# Patient Record
Sex: Female | Born: 1990 | Race: White | Hispanic: No | Marital: Married | Smoking: Never smoker
Health system: Southern US, Community
[De-identification: ages and names within clinical notes are randomized; demographics above are authoritative.]

## PROBLEM LIST (undated history)

## (undated) DIAGNOSIS — I10 Essential (primary) hypertension: Secondary | ICD-10-CM

## (undated) DIAGNOSIS — U071 COVID-19: Secondary | ICD-10-CM

## (undated) DIAGNOSIS — F32A Depression, unspecified: Secondary | ICD-10-CM

## (undated) HISTORY — PX: WISDOM TOOTH EXTRACTION: SHX21

---
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
## 2017-03-17 ENCOUNTER — Encounter: Payer: Self-pay | Admitting: Physician Assistant

## 2017-03-17 ENCOUNTER — Ambulatory Visit: Payer: BC Managed Care – PPO | Admitting: Physician Assistant

## 2017-03-17 VITALS — BP 138/86 | HR 80 | Temp 98.6°F | Resp 16 | Ht 66.0 in | Wt 185.0 lb

## 2017-03-17 DIAGNOSIS — Z130 Encounter for screening for diseases of the blood and blood-forming organs and certain disorders involving the immune mechanism: Secondary | ICD-10-CM | POA: Diagnosis not present

## 2017-03-17 DIAGNOSIS — Z114 Encounter for screening for human immunodeficiency virus [HIV]: Secondary | ICD-10-CM | POA: Diagnosis not present

## 2017-03-17 DIAGNOSIS — Z13220 Encounter for screening for lipoid disorders: Secondary | ICD-10-CM | POA: Diagnosis not present

## 2017-03-17 DIAGNOSIS — Z1329 Encounter for screening for other suspected endocrine disorder: Secondary | ICD-10-CM | POA: Diagnosis not present

## 2017-03-17 DIAGNOSIS — F419 Anxiety disorder, unspecified: Secondary | ICD-10-CM | POA: Diagnosis not present

## 2017-03-17 DIAGNOSIS — Z131 Encounter for screening for diabetes mellitus: Secondary | ICD-10-CM

## 2017-03-17 DIAGNOSIS — Z8639 Personal history of other endocrine, nutritional and metabolic disease: Secondary | ICD-10-CM

## 2017-03-17 MED ORDER — ESCITALOPRAM OXALATE 10 MG PO TABS: 10.0000 mg | ORAL_TABLET | Freq: Every day | ORAL | 2 refills | Status: DC

## 2017-03-17 NOTE — Patient Instructions (Signed)

## 2017-03-17 NOTE — Progress Notes (Signed)
Patient: Alexandra Fleming Female    DOB: 08/19/1990   27 y.o.   MRN: [id number] Visit Date: 03/17/2017  Today's Provider: Trey SailorsAdriana M Pollak, PA-C   Chief Complaint  Patient presents with  . Establish Care  . Anxiety   Subjective:    Alexandra Oppenheimabitha Stinson Baade is a 27 y/o woman presenting today to establish care. She was a patient at this clinic remotely but hasn't been seen here in six years. She [place] living in [place] with her husband of 6 years. She works as an Tourist information centre managerelementary school teacher. Pets: two dogs, two cats.   She is sexually active with her husband. Uses condoms, does not desire birth control.  She had a PAP 6 years ago.  Flu shot: declined Tetanus shot: unsure of last date, will see if we can get records  She has a history of anxiety remotely. She used to be on lexparo, which worked well for her. She cites she has recently had excessive and unreasonable worry. She would like to go back on lexapro. Denies feeling down or depressed. Denies SI/HI.   Anxiety  Presents for initial visit. Onset was 1 to 6 months ago. The problem has been gradually worsening. Symptoms include excessive worry, nervous/anxious behavior and panic. Patient reports no chest pain, compulsions, confusion, decreased concentration, depressed mood, dizziness, dry mouth, feeling of choking, hyperventilation, impotence, insomnia, irritability, malaise, muscle tension, nausea, obsessions, palpitations, restlessness, shortness of breath or suicidal ideas. The symptoms are aggravated by family issues and work stress. The quality of sleep is fair.   Past treatments include SSRIs (Lexapro).       Allergies  Allergen Reactions  . Amoxicillin   . Penicillins     No current outpatient medications on file.  Review of Systems  Constitutional: Positive for appetite change. Negative for activity change, chills, diaphoresis, fatigue, fever, irritability and unexpected weight change.  Respiratory:  Negative for shortness of breath.   Cardiovascular: Negative for chest pain and palpitations.  Gastrointestinal: Negative for nausea.  Genitourinary: Negative for impotence.  Neurological: Negative for dizziness.  Psychiatric/Behavioral: Negative for confusion, decreased concentration, dysphoric mood, hallucinations, self-injury, sleep disturbance and suicidal ideas. The patient is nervous/anxious. The patient does not have insomnia and is not hyperactive.     Social History   Tobacco Use  . Smoking status: Never Smoker  . Smokeless tobacco: Never Used  Substance Use Topics  . Alcohol use: Yes    Alcohol/week: 3.6 oz    Types: 2 Glasses of wine, 4 Cans of beer per week   Objective:   BP 138/86 (BP Location: Right Arm, Patient Position: Sitting, Cuff Size: Large)   Pulse 80   Temp 98.6 F (37 C) (Oral)   Resp 16   Ht 5\' 6"  (1.676 m)   Wt 185 lb (83.9 kg)   LMP 03/10/2017   BMI 29.86 kg/m  Vitals:   03/17/17 1420  BP: 138/86  Pulse: 80  Resp: 16  Temp: 98.6 F (37 C)  TempSrc: Oral  Weight: 185 lb (83.9 kg)  Height: 5\' 6"  (1.676 m)     Physical Exam  Constitutional: She is oriented to person, place, and time. She appears well-developed and well-nourished.  Cardiovascular: Normal rate and regular rhythm.  Pulmonary/Chest: Effort normal and breath sounds normal.  Abdominal: Soft. Bowel sounds are normal.  Neurological: She is alert and oriented to person, place, and time.  Skin: Skin is warm and dry.  Psychiatric: She has a normal  mood and affect. Her behavior is normal.        Assessment & Plan:     1. Anxiety  Will start Lexapro today and see her back at physical to assess how she's doing. Plan to to PAP and breast exam and physical.  - escitalopram (LEXAPRO) 10 MG tablet; Take 1 tablet (10 mg total) by mouth daily.  Dispense: 30 tablet; Refill: 2  2. History of vitamin D deficiency  - Vitamin D (25 hydroxy)  3. Encounter for screening for HIV  - HIV  antibody (with reflex)  4. Screening for thyroid disorder  - TSH  5. Screening, lipid  - Lipid Profile  6. Diabetes mellitus screening  - Comprehensive Metabolic Panel (CMET)  7. Screening for deficiency anemia  - CBC with Differential  Return in about 1 month (around 04/17/2017) for anxiety.  The entirety of the information documented in the History of Present Illness, Review of Systems and Physical Exam were personally obtained by me. Portions of this information were initially documented by Kavin Leech, CMA and reviewed by me for thoroughness and accuracy.        Trey Sailors, PA-C  [facility]

## 2017-03-18 DIAGNOSIS — F419 Anxiety disorder, unspecified: Secondary | ICD-10-CM | POA: Insufficient documentation

## 2017-03-19 LAB — CBC WITH DIFFERENTIAL/PLATELET
Basophils Absolute: 0 10*3/uL (ref 0.0–0.2)
Basos: 0 %
EOS (ABSOLUTE): 0 10*3/uL (ref 0.0–0.4)
Eos: 1 %
Hematocrit: 37.9 % (ref 34.0–46.6)
Hemoglobin: 12.9 g/dL (ref 11.1–15.9)
Immature Grans (Abs): 0 10*3/uL (ref 0.0–0.1)
Immature Granulocytes: 0 %
Lymphocytes Absolute: 1.9 10*3/uL (ref 0.7–3.1)
Lymphs: 26 %
MCH: 29.7 pg (ref 26.6–33.0)
MCHC: 34 g/dL (ref 31.5–35.7)
MCV: 87 fL (ref 79–97)
Monocytes Absolute: 0.5 10*3/uL (ref 0.1–0.9)
Monocytes: 7 %
Neutrophils Absolute: 4.8 10*3/uL (ref 1.4–7.0)
Neutrophils: 66 %
Platelets: 307 10*3/uL (ref 150–379)
RBC: 4.35 x10E6/uL (ref 3.77–5.28)
RDW: 12.8 % (ref 12.3–15.4)
WBC: 7.3 10*3/uL (ref 3.4–10.8)

## 2017-03-19 LAB — COMPREHENSIVE METABOLIC PANEL
ALT: 12 IU/L (ref 0–32)
AST: 12 IU/L (ref 0–40)
Albumin/Globulin Ratio: 2 (ref 1.2–2.2)
Albumin: 4.9 g/dL (ref 3.5–5.5)
Alkaline Phosphatase: 59 IU/L (ref 39–117)
BUN/Creatinine Ratio: 14 (ref 9–23)
BUN: 11 mg/dL (ref 6–20)
Bilirubin Total: 0.5 mg/dL (ref 0.0–1.2)
CO2: 23 mmol/L (ref 20–29)
Calcium: 9.4 mg/dL (ref 8.7–10.2)
Chloride: 99 mmol/L (ref 96–106)
Creatinine, Ser: 0.79 mg/dL (ref 0.57–1.00)
GFR calc Af Amer: 118 mL/min/{1.73_m2} (ref >59)
GFR calc non Af Amer: 102 mL/min/{1.73_m2} (ref >59)
Globulin, Total: 2.5 g/dL (ref 1.5–4.5)
Glucose: 83 mg/dL (ref 65–99)
Potassium: 3.8 mmol/L (ref 3.5–5.2)
Sodium: 139 mmol/L (ref 134–144)
Total Protein: 7.4 g/dL (ref 6.0–8.5)

## 2017-03-19 LAB — LIPID PANEL
Chol/HDL Ratio: 2.3 ratio (ref 0.0–4.4)
Cholesterol, Total: 148 mg/dL (ref 100–199)
HDL: 64 mg/dL (ref >39)
LDL Calculated: 72 mg/dL (ref 0–99)
Triglycerides: 61 mg/dL (ref 0–149)
VLDL Cholesterol Cal: 12 mg/dL (ref 5–40)

## 2017-03-19 LAB — VITAMIN D 25 HYDROXY (VIT D DEFICIENCY, FRACTURES): Vit D, 25-Hydroxy: 23.5 ng/mL — ABNORMAL LOW (ref 30.0–100.0)

## 2017-03-19 LAB — HIV ANTIBODY (ROUTINE TESTING W REFLEX): HIV Screen 4th Generation wRfx: NONREACTIVE

## 2017-03-19 LAB — TSH: TSH: 2.19 u[IU]/mL (ref 0.450–4.500)

## 2017-03-22 ENCOUNTER — Telehealth: Payer: Self-pay

## 2017-03-22 NOTE — Telephone Encounter (Signed)
Pt advised.   Thanks,   -Mariyah Upshaw  

## 2017-03-22 NOTE — Telephone Encounter (Signed)
Pt's mom Alexandra Fleming (on pt's DPR) is requesting a call back to get pt's lab results. Alexandra Fleming stated that pt gets bad cell service where she is and she had called mom requesting mom to call the office to get results. Please advise. Thanks TNP

## 2017-03-22 NOTE — Telephone Encounter (Signed)
-----   Message from Trey SailorsAdriana M Pollak, [place] sent at 03/21/2017  4:33 PM EST ----- No diabetes, normal kidney and liver function. CBC with no anemia or infection. Normal Cholesterol. TSH normal. Vitamin D slightly low at 23. Can take 600-800 IU daily, this is OTC. We can check again at later date. HIV nonreactive.

## 2017-03-22 NOTE — Telephone Encounter (Signed)
[name] 03/22/2017  Thanks,   -Vernona RiegerLaura

## 2017-04-15 ENCOUNTER — Encounter: Payer: Self-pay | Admitting: Physician Assistant

## 2017-04-15 ENCOUNTER — Ambulatory Visit (INDEPENDENT_AMBULATORY_CARE_PROVIDER_SITE_OTHER): Payer: BC Managed Care – PPO | Admitting: Physician Assistant

## 2017-04-15 VITALS — BP 122/86 | HR 76 | Temp 97.6°F | Resp 16 | Wt 181.4 lb

## 2017-04-15 DIAGNOSIS — F419 Anxiety disorder, unspecified: Secondary | ICD-10-CM | POA: Diagnosis not present

## 2017-04-15 DIAGNOSIS — Z124 Encounter for screening for malignant neoplasm of cervix: Secondary | ICD-10-CM

## 2017-04-15 DIAGNOSIS — Z3009 Encounter for other general counseling and advice on contraception: Secondary | ICD-10-CM

## 2017-04-15 DIAGNOSIS — Z23 Encounter for immunization: Secondary | ICD-10-CM

## 2017-04-15 DIAGNOSIS — Z0000 Encounter for general adult medical examination without abnormal findings: Secondary | ICD-10-CM

## 2017-04-15 LAB — POCT URINE PREGNANCY: Preg Test, Ur: NEGATIVE

## 2017-04-15 NOTE — Progress Notes (Signed)
.       Patient: Alexandra Fleming, Female    DOB: Mar 11, 1990, 27 y.o.   MRN: [id number] Visit Date: 04/15/2017  Today's Provider: Trinna Post, PA-C   Chief Complaint  Patient presents with  . Annual Exam    Patient comes in office for her annual physical and pap screen  . Depression    Follow up   Subjective:    Annual physical exam Alexandra Fleming is a 27 y.o. female who presents today for health maintenance and complete physical. She feels well. She reports exercising 3x a week. She reports she is sleeping well. Patient last reported pap exam was in 2013, patient states that results were normal, patient would like to discuss birth control options today.   Patient is doing monthly self breast checks and states that there has been no changes. Patients mother reports she has breast ductal carcinoma in situ diagnosed at age 65, has not been BRCA tested and patient would like to inquire about future mammogram screening. Patient has declined flu vaccine today but is due to update her Tdap vaccine  Anxiety Follow Up Patient returns for one month follow up for anxiety, she states at last visit she was started on 10 mg Lexapro nightly and reports good symptom control and compliance on medication. She had been on this medication previously.    Depression screen PHQ 2/9 03/23/2017  Decreased Interest 2  Down, Depressed, Hopeless 2  PHQ - 2 Score 4  Altered sleeping 1  Tired, decreased energy 2  Change in appetite 3  Feeling bad or failure about yourself  2  Trouble concentrating 0  Moving slowly or fidgety/restless 0  Suicidal thoughts 1  PHQ-9 Score 13  Difficult doing work/chores Not difficult at all      -----------------------------------------------------------------   Review of Systems  Constitutional: Negative.   HENT: Negative.   Eyes: Negative.   Respiratory: Negative.   Cardiovascular: Negative.   Gastrointestinal: Negative.   Endocrine:  Negative.   Genitourinary: Negative.   Musculoskeletal: Negative.   Allergic/Immunologic: Negative.   Neurological: Negative.   Hematological: Does not bruise/bleed easily.  Psychiatric/Behavioral: Negative.     Social History She  reports that  has never smoked. she has never used smokeless tobacco. She reports that she drinks about 3.6 oz of alcohol per week. She reports that she does not use drugs. Social History   Socioeconomic History  . Marital status: Married    Spouse name: None  . Number of children: None  . Years of education: None  . Highest education level: None  Social Needs  . Financial resource strain: None  . Food insecurity - worry: None  . Food insecurity - inability: None  . Transportation needs - medical: None  . Transportation needs - non-medical: None  Occupational History  . None  Tobacco Use  . Smoking status: Never Smoker  . Smokeless tobacco: Never Used  Substance and Sexual Activity  . Alcohol use: Yes    Alcohol/week: 3.6 oz    Types: 2 Glasses of wine, 4 Cans of beer per week  . Drug use: No  . Sexual activity: None  Other Topics Concern  . None  Social History Narrative  . None    Patient Active Problem List   Diagnosis Date Noted  . Anxiety 03/18/2017    Past Surgical History:  Procedure Laterality Date  . WISDOM TOOTH EXTRACTION      Family History  Family Status  Relation  Name Status  . Mother  Alive  . Father  Alive  . MGM  Alive  . MGF  Deceased  . PGM  Alive  . PGF  (Not Specified)  . Ethlyn Daniels  (Not Specified)  . Mat Uncle  (Not Specified)   Her family history includes Allergies in her mother; Anxiety disorder in her paternal aunt; Breast cancer (age of onset: 43) in her mother; COPD in her maternal grandfather; Diabetes in her maternal grandmother; High Cholesterol in her father; Hyperlipidemia in her paternal grandfather and paternal grandmother; Hypertension in her father, maternal grandmother, maternal uncle,  paternal aunt, paternal grandfather, and paternal grandmother; Sleep apnea in her father and paternal grandfather; Thyroid disease in her paternal grandmother.     Allergies  Allergen Reactions  . Amoxicillin   . Penicillins     Previous Medications   ESCITALOPRAM (LEXAPRO) 10 MG TABLET    Take 1 tablet (10 mg total) by mouth daily.    Patient Care Team: Paulene Floor as PCP - General (Physician Assistant)      Objective:   Vitals: BP 122/86   Pulse 76   Temp 97.6 F (36.4 C) (Oral)   Resp 16   Wt 181 lb 6.4 oz (82.3 kg)   LMP 04/05/2017 (Exact Date)   SpO2 98%   BMI 29.28 kg/m    Physical Exam  Constitutional: She is oriented to person, place, and time. She appears well-developed and well-nourished.  Cardiovascular: Normal rate and regular rhythm.  Pulmonary/Chest: Effort normal and breath sounds normal. Right breast exhibits no inverted nipple, no mass, no nipple discharge, no skin change and no tenderness. Left breast exhibits no inverted nipple, no mass, no nipple discharge, no skin change and no tenderness. Breasts are symmetrical.  Neurological: She is alert and oriented to person, place, and time.  Skin: Skin is warm and dry.  Psychiatric: She has a normal mood and affect. Her behavior is normal.     Depression Screen PHQ 2/9 Scores 03/23/2017  PHQ - 2 Score 4  PHQ- 9 Score 13      Assessment & Plan:     Routine Health Maintenance and Physical Exam  Exercise Activities and Dietary recommendations Goals    None      Immunization History  Administered Date(s) Administered  . Hepatitis B 12/04/1999, 01/08/2000, 06/17/2000  . Meningococcal Conjugate 06/03/2006  . Tdap 04/15/2017    Health Maintenance  Topic Date Due  . TETANUS/TDAP  12/07/2007  . PAP SMEAR  12/06/2009  . INFLUENZA VACCINE  11/17/2017 (Originally 09/22/2016)  . HIV Screening  Completed     Discussed health benefits of physical activity, and encouraged her to engage in  regular exercise appropriate for her age and condition.    1. Annual physical exam   2. Encounter for counseling regarding contraception  - POCT urine pregnancy  3. Cervical cancer screening  - Pap IG w/ reflex to HPV when ASC-U  4. Anxiety  Her symptoms have improved greatly on Lexapro. Continue Lexapro at 10 mg, will refill.    Return in about 1 year (around 04/15/2018).  The entirety of the information documented in the History of Present Illness, Review of Systems and Physical Exam were personally obtained by me. Portions of this information were initially documented by Ashley Royalty, CMA and reviewed by me for thoroughness and accuracy.   --------------------------------------------------------------------

## 2017-04-15 NOTE — Patient Instructions (Signed)
Return back to office on 06/25/17 at 8AM for IUD placement.

## 2017-04-19 LAB — PAP IG W/ RFLX HPV ASCU: PAP Smear Comment: 0

## 2017-04-26 ENCOUNTER — Encounter: Payer: Self-pay | Admitting: Family Medicine

## 2017-04-26 ENCOUNTER — Ambulatory Visit: Payer: BC Managed Care – PPO | Admitting: Family Medicine

## 2017-04-26 VITALS — BP 112/80 | HR 71 | Temp 98.4°F | Resp 16 | Wt 182.0 lb

## 2017-04-26 DIAGNOSIS — Z30430 Encounter for insertion of intrauterine contraceptive device: Secondary | ICD-10-CM | POA: Diagnosis not present

## 2017-04-26 LAB — POCT URINE PREGNANCY: Preg Test, Ur: NEGATIVE

## 2017-04-26 MED ORDER — ALPRAZOLAM 0.5 MG PO TABS: ORAL_TABLET | ORAL | 0 refills | Status: DC

## 2017-04-26 NOTE — Progress Notes (Signed)
Patient: Alexandra Fleming Female    DOB: [birth year]   27 y.o.   MRN: [id number] Visit Date: 04/26/2017  Today's Provider: Shirlee LatchAngela Cresencia Asmus, MD   I, Joslyn HyEmily Ratchford, CMA, am acting as scribe for Shirlee LatchAngela Brittanny Levenhagen, MD.  Chief Complaint  Patient presents with  . Contraception   Subjective:    HPI   Pt presents for IUD insertion for contraception.  She does not want children and desires LARC.  She reports regular menstrual cycles that last 5-6 days with middle days being heavy. She uses a menstrual cup.  She and her husband use condoms as well as he has HSV.  Allergies  Allergen Reactions  . Amoxicillin   . Penicillins      Current Outpatient Medications:  .  cholecalciferol (VITAMIN D) 1000 units tablet, Take 1,000 Units by mouth daily., Disp: , Rfl:  .  escitalopram (LEXAPRO) 10 MG tablet, Take 1 tablet (10 mg total) by mouth daily., Disp: 30 tablet, Rfl: 2  Review of Systems  Constitutional: Negative for activity change, appetite change, chills, diaphoresis, fatigue, fever and unexpected weight change.  Genitourinary: Negative for dysuria, vaginal bleeding, vaginal discharge and vaginal pain.    Social History   Tobacco Use  . Smoking status: Never Smoker  . Smokeless tobacco: Never Used  Substance Use Topics  . Alcohol use: Yes    Alcohol/week: 3.6 oz    Types: 2 Glasses of wine, 4 Cans of beer per week   Objective:   BP 112/80 (BP Location: Left Arm, Patient Position: Sitting, Cuff Size: Normal)   Pulse 71   Temp 98.4 F (36.9 C) (Oral)   Resp 16   Wt 182 lb (82.6 kg)   LMP 04/05/2017 (Exact Date)   SpO2 99%   BMI 29.38 kg/m  Vitals:   04/26/17 0810  BP: 112/80  Pulse: 71  Resp: 16  Temp: 98.4 F (36.9 C)  TempSrc: Oral  SpO2: 99%  Weight: 182 lb (82.6 kg)     Physical Exam  Constitutional: She is oriented to person, place, and time. She appears well-developed and well-nourished. No distress.  HENT:  Head: Normocephalic and  atraumatic.  Eyes: Conjunctivae are normal.  Cardiovascular: Normal rate and regular rhythm.  Pulmonary/Chest: Effort normal. No respiratory distress.  Abdominal: Soft. She exhibits no distension. There is no tenderness.  Musculoskeletal: She exhibits no edema.  Neurological: She is alert and oriented to person, place, and time.  Psychiatric: Her speech is normal and behavior is normal. Her mood appears anxious. Cognition and memory are normal.  Vitals reviewed.   Results for orders placed or performed in visit on 04/26/17  POCT urine pregnancy  Result Value Ref Range   Preg Test, Ur Negative Negative       Assessment & Plan:      1. Encounter for insertion of intrauterine contraceptive device (IUD) - unable to place IUD due to cervical stenosis in setting of anxiety - Rx for Xanax for repeat trial of placement in 1 month - return precautions discussed - POCT urine pregnancy    Meds ordered this encounter  Medications  . ALPRAZolam (XANAX) 0.5 MG tablet    Sig: Take 0.5mg  once 1 hour prior to procedure.  May repeat after 1 hour if needed    Dispense:  2 tablet    Refill:  0     Return in about 4 weeks (around 05/24/2017) for IUD placement.   The entirety of the information documented  in the History of Present Illness, Review of Systems and Physical Exam were personally obtained by me. Portions of this information were initially documented by Irving Burton Ratchford, CMA and reviewed by me for thoroughness and accuracy.    Erasmo Downer, MD, MPH [facility] 04/26/2017 9:18 AM    IUD Insertion Procedure Note  Pre-operative Diagnosis: desire for contraception  Post-operative Diagnosis: same  Indications: contraception  Procedure Details  Urine pregnancy test was done and result was negative.  The risks (including infection, bleeding, pain, and uterine perforation) and benefits of the procedure were explained to the patient and Written informed consent  was obtained.    Cervix cleansed with Betadine. Tenaculum attached to anterior cervix.  Uterus sounded to 8 cm. After sounding uterus, cervix tightened up. Unable to pass IUD insertion device through internal os.  Tenaculum popped off and was replaced and 2 more efforts made to advance IUD device.  Unable to complete insertion due to cervical stenosis  IUD Information: Mirena, Lot # [id number], Expiration date May 2021.  Condition: Stable  Complications: None  Plan:  The patient was advised to call for any fever or for prolonged or severe pain or bleeding. She was advised to use OTC acetaminophen and OTC ibuprofen as needed for mild to moderate pain.    Erasmo Downer, MD, MPH [facility] 04/26/2017 9:24 AM

## 2017-04-26 NOTE — Patient Instructions (Signed)

## 2017-04-27 ENCOUNTER — Telehealth: Payer: Self-pay

## 2017-04-27 NOTE — Telephone Encounter (Signed)
-----   Message from Trey SailorsAdriana M Fleming, [place] sent at 04/26/2017  1:16 PM EST ----- PAP was normal, did not reflex to HPV. Showed some inflammation, this is usually associated with yeast or other infections. Patient did not have concern for STI. Just to make sure, she does not have any discharge? There is no increased frequency of screening PAPs for this, repeat PAP in three years.

## 2017-04-27 NOTE — Telephone Encounter (Signed)
Pt advised. Denies discharge. Agrees to repeat in 3 years.

## 2017-04-27 NOTE — Telephone Encounter (Signed)
[name] 04/27/2017  Thanks,   -Laura  

## 2017-05-09 ENCOUNTER — Other Ambulatory Visit: Payer: Self-pay | Admitting: Physician Assistant

## 2017-05-09 DIAGNOSIS — F419 Anxiety disorder, unspecified: Secondary | ICD-10-CM

## 2017-05-09 MED ORDER — ESCITALOPRAM OXALATE 10 MG PO TABS
10.0000 mg | ORAL_TABLET | Freq: Every day | ORAL | 0 refills | Status: DC
Start: 2017-05-09 — End: 2017-08-04

## 2017-05-09 NOTE — Telephone Encounter (Signed)
Please review. Thanks!  

## 2017-05-09 NOTE — Telephone Encounter (Signed)
[facility] pharmacy faxed a refill request for a 90-days supply for the following medication. Thanks CC ° °escitalopram (LEXAPRO) 10 MG tablet  ° °

## 2017-05-10 NOTE — Addendum Note (Signed)
Addended by: Kavin LeechWALSH, Alyona Romack E on: 05/10/2017 08:15 AM   Modules accepted: Orders

## 2017-06-06 ENCOUNTER — Encounter: Payer: Self-pay | Admitting: Family Medicine

## 2017-06-06 ENCOUNTER — Ambulatory Visit: Payer: BC Managed Care – PPO | Admitting: Family Medicine

## 2017-06-06 VITALS — BP 124/86 | HR 80 | Temp 97.6°F | Resp 16 | Wt 186.0 lb

## 2017-06-06 DIAGNOSIS — Z30430 Encounter for insertion of intrauterine contraceptive device: Secondary | ICD-10-CM

## 2017-06-06 LAB — POCT URINE PREGNANCY: PREG TEST UR: NEGATIVE

## 2017-06-06 MED ORDER — LEVONORGESTREL 20 MCG/24HR IU IUD
INTRAUTERINE_SYSTEM | Freq: Once | INTRAUTERINE | Status: AC
  Administered 2017-06-06: 10:00:00 via INTRAUTERINE

## 2017-06-06 NOTE — Progress Notes (Signed)
IUD Insertion Procedure Note  Pre-operative Diagnosis: desire for contraception  Post-operative Diagnosis: normal  Indications: contraception  Procedure Details  Urine pregnancy test was done and result was negative.  The risks (including infection, bleeding, pain, and uterine perforation) and benefits of the procedure were explained to the patient and Written informed consent was obtained.    Cervix cleansed with Betadine. Uterus sounded to 7 cm. IUD inserted without difficulty. String visible and trimmed. Patient tolerated procedure well.  IUD Information: Mirena, Lot # [id number], Expiration date 09/2019.  Condition: Stable  Complications: None  Plan:  The patient was advised to call for any fever or for prolonged or severe pain or bleeding. She was advised to use OTC analgesics as needed for mild to moderate pain.    Erasmo DownerBacigalupo, Aryonna Gunnerson M, MD, MPH [facility] 06/06/2017 9:16 AM

## 2017-06-06 NOTE — Patient Instructions (Signed)

## 2017-06-06 NOTE — Progress Notes (Signed)
Patient: Alexandra Fleming Female    DOB: 1990-11-24   27 y.o.   MRN: [id number] Visit Date: 06/06/2017  Today's Provider: Shirlee Latch, MD   I, Joslyn Hy, CMA, am acting as scribe for Shirlee Latch, MD.  Chief Complaint  Patient presents with  . Contraception   Subjective:    HPI     Pt presents for IUD insertion for contraception.  She does not want children and desires LARC.  She reports regular menstrual cycles that last 5-6 days with middle days being heavy. She uses a menstrual cup.  She and her husband use condoms as well as he has HSV.   Allergies  Allergen Reactions  . Amoxicillin   . Penicillins      Current Outpatient Medications:  .  ALPRAZolam (XANAX) 0.5 MG tablet, Take 0.5mg  once 1 hour prior to procedure.  May repeat after 1 hour if needed, Disp: 2 tablet, Rfl: 0 .  cholecalciferol (VITAMIN D) 1000 units tablet, Take 1,000 Units by mouth daily., Disp: , Rfl:  .  escitalopram (LEXAPRO) 10 MG tablet, Take 1 tablet (10 mg total) by mouth daily., Disp: 90 tablet, Rfl: 0  Review of Systems  Constitutional: Negative for activity change, appetite change, chills, diaphoresis, fatigue, fever and unexpected weight change.  Genitourinary: Negative for dysuria, menstrual problem, pelvic pain, vaginal bleeding, vaginal discharge and vaginal pain.    Social History   Tobacco Use  . Smoking status: Never Smoker  . Smokeless tobacco: Never Used  Substance Use Topics  . Alcohol use: Yes    Alcohol/week: 3.6 oz    Types: 2 Glasses of wine, 4 Cans of beer per week   Objective:   BP 124/86 (BP Location: Right Arm, Patient Position: Sitting, Cuff Size: Normal)   Pulse 80   Temp 97.6 F (36.4 C) (Oral)   Resp 16   Wt 186 lb (84.4 kg)   LMP 05/30/2017   SpO2 99%   BMI 30.02 kg/m  Vitals:   06/06/17 0906  BP: 124/86  Pulse: 80  Resp: 16  Temp: 97.6 F (36.4 C)  TempSrc: Oral  SpO2: 99%  Weight: 186 lb (84.4 kg)     Physical  Exam  Constitutional: She is oriented to person, place, and time. She appears well-developed and well-nourished. No distress.  HENT:  Head: Normocephalic and atraumatic.  Eyes: Conjunctivae are normal.  Cardiovascular: Normal rate and regular rhythm.  Pulmonary/Chest: Effort normal. No respiratory distress.  Genitourinary:  Genitourinary Comments: GYN:  External genitalia within normal limits.  Vaginal mucosa pink, moist, normal rugae.  Nonfriable cervix without lesions, no discharge or bleeding noted on speculum exam.    Musculoskeletal: She exhibits no edema.  Neurological: She is alert and oriented to person, place, and time.  Skin: Capillary refill takes less than 2 seconds.  Psychiatric: She has a normal mood and affect. Her behavior is normal.  Vitals reviewed.   Results for orders placed or performed in visit on 06/06/17  POCT urine pregnancy  Result Value Ref Range   Preg Test, Ur Negative Negative       Assessment & Plan:     1. Encounter for insertion of intrauterine contraceptive device (IUD) - 2nd attempt at IUD insertion - previously unable to place due to cervical stenosis in setting of acute anxiety - patient tolerated well today after taking Xanax - see procedure note for details - POCT urine pregnancy - levonorgestrel (MIRENA) 20 MCG/24HR IUD  Meds ordered this encounter  Medications  . levonorgestrel (MIRENA) 20 MCG/24HR IUD     Return in about 1 month (around 07/04/2017) for IUD string check.   The entirety of the information documented in the History of Present Illness, Review of Systems and Physical Exam were personally obtained by me. Portions of this information were initially documented by Irving BurtonEmily Ratchford, CMA and reviewed by me for thoroughness and accuracy.    Erasmo DownerBacigalupo, Khaleef Ruby M, MD, MPH [facility] 06/06/2017 9:40 AM

## 2017-07-01 ENCOUNTER — Ambulatory Visit (INDEPENDENT_AMBULATORY_CARE_PROVIDER_SITE_OTHER): Payer: BC Managed Care – PPO | Admitting: Family Medicine

## 2017-07-01 ENCOUNTER — Encounter: Payer: Self-pay | Admitting: Family Medicine

## 2017-07-01 VITALS — BP 112/70 | HR 90 | Temp 98.4°F | Resp 16 | Wt 189.0 lb

## 2017-07-01 DIAGNOSIS — Z30431 Encounter for routine checking of intrauterine contraceptive device: Secondary | ICD-10-CM | POA: Diagnosis not present

## 2017-07-01 NOTE — Progress Notes (Signed)
Patient: Alexandra Fleming Female    DOB: 17-May-1990   27 y.o.   MRN: [id number] Visit Date: 07/01/2017  Today's Provider: Shirlee Latch, MD   I, Joslyn Hy, CMA, am acting as scribe for Shirlee Latch, MD.  Chief Complaint  Patient presents with  . IUD String Check   Subjective:    HPI   Pt presents for an IUD string check. She states she had a period about 2 weeks ago, that was associated with severe cramping. She is still spotting. She states she tried to feel for the strings herself, and was unable to feel them.   Allergies  Allergen Reactions  . Amoxicillin   . Penicillins      Current Outpatient Medications:  .  cholecalciferol (VITAMIN D) 1000 units tablet, Take 1,000 Units by mouth daily., Disp: , Rfl:  .  escitalopram (LEXAPRO) 10 MG tablet, Take 1 tablet (10 mg total) by mouth daily., Disp: 90 tablet, Rfl: 0 .  levonorgestrel (MIRENA) 20 MCG/24HR IUD, 1 each by Intrauterine route once., Disp: , Rfl:   Review of Systems  Constitutional: Positive for appetite change (increased) and fatigue. Negative for activity change, chills, diaphoresis and fever.  Cardiovascular: Negative for chest pain, palpitations and leg swelling.  Gastrointestinal: Positive for abdominal pain (cramping).  Genitourinary: Positive for vaginal bleeding.    Social History   Tobacco Use  . Smoking status: Never Smoker  . Smokeless tobacco: Never Used  Substance Use Topics  . Alcohol use: Yes    Alcohol/week: 3.6 oz    Types: 2 Glasses of wine, 4 Cans of beer per week   Objective:   BP 112/70 (BP Location: Left Arm, Patient Position: Sitting, Cuff Size: Large)   Pulse 90   Temp 98.4 F (36.9 C) (Oral)   Resp 16   Wt 189 lb (85.7 kg)   LMP 06/17/2017   SpO2 99%   BMI 30.51 kg/m  Vitals:   07/01/17 1443  BP: 112/70  Pulse: 90  Resp: 16  Temp: 98.4 F (36.9 C)  TempSrc: Oral  SpO2: 99%  Weight: 189 lb (85.7 kg)     Physical Exam    Constitutional: She is oriented to person, place, and time. She appears well-developed and well-nourished. No distress.  HENT:  Head: Normocephalic and atraumatic.  Eyes: Conjunctivae are normal.  Cardiovascular: Normal rate and regular rhythm.  Pulmonary/Chest: Effort normal. No respiratory distress.  Genitourinary:  Genitourinary Comments: GYN:  External genitalia within normal limits.  Vaginal mucosa pink, moist, normal rugae.  Nonfriable cervix without lesions, no discharge, +Spotting noted on speculum exam.  IUD strings visualized from cervical os  Neurological: She is alert and oriented to person, place, and time.  Skin: Skin is warm and dry. Capillary refill takes less than 2 seconds. No rash noted.  Psychiatric: She has a normal mood and affect. Her behavior is normal.  Vitals reviewed.      Assessment & Plan:   1. IUD check up -IUD in place and strings visible today - Discussed again that it can take 3 to 6 months for regulation of periods -Return precautions discussed   The entirety of the information documented in the History of Present Illness, Review of Systems and Physical Exam were personally obtained by me. Portions of this information were initially documented by Irving Burton Ratchford, CMA and reviewed by me for thoroughness and accuracy.    Erasmo Downer, MD, MPH [facility] 07/01/2017 3:52 PM

## 2017-08-04 ENCOUNTER — Other Ambulatory Visit: Payer: Self-pay | Admitting: Physician Assistant

## 2017-08-04 DIAGNOSIS — F419 Anxiety disorder, unspecified: Secondary | ICD-10-CM

## 2017-10-31 ENCOUNTER — Other Ambulatory Visit: Payer: Self-pay | Admitting: Physician Assistant

## 2017-10-31 DIAGNOSIS — F419 Anxiety disorder, unspecified: Secondary | ICD-10-CM

## 2018-01-16 ENCOUNTER — Telehealth: Payer: Self-pay

## 2018-01-16 NOTE — Telephone Encounter (Signed)
Can she come in for office visit? Would need to discuss and document her sx.

## 2018-01-16 NOTE — Telephone Encounter (Signed)
Left voicemail message per DPR and advised patient to give the office a call back to schedule appointment.

## 2018-01-16 NOTE — Telephone Encounter (Signed)
Patient called stating she has been under more stress and anxiety , and want to know if the dosage of Escitalopram can be increased. Please advise.

## 2018-01-18 ENCOUNTER — Ambulatory Visit: Payer: BC Managed Care – PPO | Admitting: Physician Assistant

## 2018-01-18 ENCOUNTER — Encounter: Payer: Self-pay | Admitting: Physician Assistant

## 2018-01-18 VITALS — BP 140/100 | HR 88 | Temp 97.8°F | Resp 16 | Wt 221.8 lb

## 2018-01-18 DIAGNOSIS — Z23 Encounter for immunization: Secondary | ICD-10-CM

## 2018-01-18 DIAGNOSIS — F419 Anxiety disorder, unspecified: Secondary | ICD-10-CM | POA: Diagnosis not present

## 2018-01-18 MED ORDER — ESCITALOPRAM OXALATE 20 MG PO TABS: 20.0000 mg | ORAL_TABLET | Freq: Every day | ORAL | 0 refills | Status: DC

## 2018-01-18 MED ORDER — HYDROXYZINE HCL 10 MG PO TABS: 10.0000 mg | ORAL_TABLET | Freq: Every day | ORAL | 0 refills | Status: DC | PRN

## 2018-01-18 NOTE — Patient Instructions (Signed)

## 2018-01-18 NOTE — Progress Notes (Signed)
Acute Office Visit  Subjective:    Patient ID: Alexandra Fleming, female    DOB: 08-Nov-1990, 27 y.o.   MRN: [id number]  Chief Complaint  Patient presents with  . Anxiety    HPI  Patient is in today for Follow up for Anxiety  The patient was last seen for this 9 months ago. Changes made at last visit include no changes.  She reports excellent compliance with treatment. She feels that condition is Worse. She is not having side effects.  Patient reports that in the last few months symptoms have been worse. She feels a greater sense of anxiety that she attributes to life in general and does not identify a specific cause. Says she has increased moments of feeling overwhelming anxiety that causes physical symptoms including chest pain, SOB, numbness and tingling. Patient reports that in the last few weeks she is also having insomnia. She reports she has considered seeing a counselor but does not wish to.   Wt Readings from Last 3 Encounters:  01/18/18 221 lb 12.8 oz (100.6 kg)  07/01/17 189 lb (85.7 kg)  06/06/17 186 lb (84.4 kg)    [facility] 01/18/2018 03/23/2017  Decreased Interest 1 2  Down, Depressed, Hopeless 1 2  PHQ - 2 Score 2 4  Altered sleeping 1 1  Tired, decreased energy 1 2  Change in appetite 2 3  Feeling bad or failure about yourself  0 2  Trouble concentrating 0 0  Moving slowly or fidgety/restless 0 0  Suicidal thoughts 0 1  PHQ-9 Score 6 13  Difficult doing work/chores Not difficult at all Not difficult at all    GAD 7 : Generalized Anxiety Score 01/18/2018  Nervous, Anxious, on Edge 3  Control/stop worrying 3  Worry too much - different things 3  Trouble relaxing 3  Restless 2  Easily annoyed or irritable 3  Afraid - awful might happen 3  Total GAD 7 Score 20  Anxiety Difficulty Somewhat difficult     ------------------------------------------------------------------------------------   No past medical history on  file.  Past Surgical History:  Procedure Laterality Date  . WISDOM TOOTH EXTRACTION      Family History  Problem Relation Age of Onset  . Allergies Mother   . Breast cancer Mother 3046  . High Cholesterol Father   . Hypertension Father   . Sleep apnea Father   . Diabetes Maternal Grandmother   . Hypertension Maternal Grandmother   . COPD Maternal Grandfather   . Thyroid disease Paternal Grandmother   . Hypertension Paternal Grandmother   . Hyperlipidemia Paternal Grandmother   . Sleep apnea Paternal Grandfather   . Hypertension Paternal Grandfather   . Hyperlipidemia Paternal Grandfather   . Hypertension Paternal Aunt   . Anxiety disorder Paternal Aunt   . Hypertension Maternal Uncle     Social History   Socioeconomic History  . Marital status: Married    Spouse name: Not on file  . Number of children: Not on file  . Years of education: Not on file  . Highest education level: Not on file  Occupational History  . Not on file  Social Needs  . Financial resource strain: Not on file  . Food insecurity:    Worry: Not on file    Inability: Not on file  . Transportation needs:    Medical: Not on file    Non-medical: Not on file  Tobacco Use  . Smoking status: Never Smoker  . Smokeless tobacco:  Never Used  Substance and Sexual Activity  . Alcohol use: Yes    Alcohol/week: 6.0 standard drinks    Types: 2 Glasses of wine, 4 Cans of beer per week  . Drug use: No  . Sexual activity: Not on file  Lifestyle  . Physical activity:    Days per week: Not on file    Minutes per session: Not on file  . Stress: Not on file  Relationships  . Social connections:    Talks on phone: Not on file    Gets together: Not on file    Attends religious service: Not on file    Active member of club or organization: Not on file    Attends meetings of clubs or organizations: Not on file    Relationship status: Not on file  . Intimate partner violence:    Fear of current or ex partner:  Not on file    Emotionally abused: Not on file    Physically abused: Not on file    Forced sexual activity: Not on file  Other Topics Concern  . Not on file  Social History Narrative  . Not on file    Outpatient Medications Prior to Visit  Medication Sig Dispense Refill  . levonorgestrel (MIRENA) 20 MCG/24HR IUD 1 each by Intrauterine route once.    . escitalopram (LEXAPRO) 10 MG tablet TAKE 1 TABLET BY MOUTH EVERY DAY 90 tablet 1  . cholecalciferol (VITAMIN D) 1000 units tablet Take 1,000 Units by mouth daily.     No facility-administered medications prior to visit.     Allergies  Allergen Reactions  . Amoxicillin   . Penicillins     Review of Systems  Constitutional: Negative.   Cardiovascular: Negative.   Psychiatric/Behavioral: The patient is nervous/anxious and has insomnia.        Objective:    Physical Exam  Constitutional: She is oriented to person, place, and time. She appears well-developed and well-nourished.  Cardiovascular: Normal rate.  Pulmonary/Chest: Effort normal.  Neurological: She is alert and oriented to person, place, and time.  Skin: Skin is warm and dry.  Psychiatric: Her speech is normal and behavior is normal. Her mood appears anxious. She does not exhibit a depressed mood.    BP (!) 140/100 (BP Location: Left Arm, Patient Position: Sitting, Cuff Size: Normal)   Pulse 88   Temp 97.8 F (36.6 C) (Oral)   Resp 16   Wt 221 lb 12.8 oz (100.6 kg)   SpO2 99%   BMI 35.80 kg/m  Wt Readings from Last 3 Encounters:  01/18/18 221 lb 12.8 oz (100.6 kg)  07/01/17 189 lb (85.7 kg)  06/06/17 186 lb (84.4 kg)    There are no preventive care reminders to display for this patient.  There are no preventive care reminders to display for this patient.   Lab Results  Component Value Date   TSH 2.190 03/18/2017   Lab Results  Component Value Date   WBC 7.3 03/18/2017   HGB 12.9 03/18/2017   HCT 37.9 03/18/2017   MCV 87 03/18/2017   PLT 307  03/18/2017   Lab Results  Component Value Date   NA 139 03/18/2017   K 3.8 03/18/2017   CO2 23 03/18/2017   GLUCOSE 83 03/18/2017   BUN 11 03/18/2017   CREATININE 0.79 03/18/2017   BILITOT 0.5 03/18/2017   ALKPHOS 59 03/18/2017   AST 12 03/18/2017   ALT 12 03/18/2017   PROT 7.4 03/18/2017   ALBUMIN  4.9 03/18/2017   CALCIUM 9.4 03/18/2017   Lab Results  Component Value Date   CHOL 148 03/18/2017   Lab Results  Component Value Date   HDL 64 03/18/2017   Lab Results  Component Value Date   LDLCALC 72 03/18/2017   Lab Results  Component Value Date   TRIG 61 03/18/2017   Lab Results  Component Value Date   CHOLHDL 2.3 03/18/2017   No results found for: HGBA1C     Assessment & Plan:  1. Anxiety  We will increase her Lexapro to 20 mg daily and see her back in 6 weeks. Will give hydroxyzine for PRN anxiety.   - escitalopram (LEXAPRO) 20 MG tablet; Take 1 tablet (20 mg total) by mouth daily.  Dispense: 90 tablet; Refill: 0 - hydrOXYzine (ATARAX/VISTARIL) 10 MG tablet; Take 1 tablet (10 mg total) by mouth daily as needed.  Dispense: 30 tablet; Refill: 0  2. Need for influenza vaccination  - Flu Vaccine QUAD 36+ mos IM  Meds ordered this encounter  Medications  . escitalopram (LEXAPRO) 20 MG tablet    Sig: Take 1 tablet (20 mg total) by mouth daily.    Dispense:  90 tablet    Refill:  0    Order Specific Question:   Supervising Provider    Answer:   Malva Limes [[id number]]  . hydrOXYzine (ATARAX/VISTARIL) 10 MG tablet    Sig: Take 1 tablet (10 mg total) by mouth daily as needed.    Dispense:  30 tablet    Refill:  0    Order Specific Question:   Supervising Provider    Answer:   Malva Limes [[id number]]   The entirety of the information documented in the History of Present Illness, Review of Systems and Physical Exam were personally obtained by me. Portions of this information were initially documented by Rondel Baton, CMA and reviewed by me for  thoroughness and accuracy.    Trey Sailors, PA-C

## 2018-02-09 ENCOUNTER — Other Ambulatory Visit: Payer: Self-pay | Admitting: Physician Assistant

## 2018-02-09 DIAGNOSIS — F419 Anxiety disorder, unspecified: Secondary | ICD-10-CM

## 2018-04-13 ENCOUNTER — Other Ambulatory Visit: Payer: Self-pay | Admitting: Physician Assistant

## 2018-04-13 DIAGNOSIS — F419 Anxiety disorder, unspecified: Secondary | ICD-10-CM

## 2018-04-18 ENCOUNTER — Encounter: Payer: Self-pay | Admitting: Physician Assistant

## 2018-04-18 ENCOUNTER — Ambulatory Visit (INDEPENDENT_AMBULATORY_CARE_PROVIDER_SITE_OTHER): Payer: BC Managed Care – PPO | Admitting: Physician Assistant

## 2018-04-18 VITALS — BP 148/108 | HR 92 | Temp 98.4°F | Resp 16 | Ht 65.0 in | Wt 237.6 lb

## 2018-04-18 DIAGNOSIS — Z0000 Encounter for general adult medical examination without abnormal findings: Secondary | ICD-10-CM

## 2018-04-18 DIAGNOSIS — R030 Elevated blood-pressure reading, without diagnosis of hypertension: Secondary | ICD-10-CM

## 2018-04-18 DIAGNOSIS — Z1329 Encounter for screening for other suspected endocrine disorder: Secondary | ICD-10-CM

## 2018-04-18 DIAGNOSIS — Z13220 Encounter for screening for lipoid disorders: Secondary | ICD-10-CM | POA: Diagnosis not present

## 2018-04-18 DIAGNOSIS — Z131 Encounter for screening for diabetes mellitus: Secondary | ICD-10-CM

## 2018-04-18 DIAGNOSIS — F419 Anxiety disorder, unspecified: Secondary | ICD-10-CM

## 2018-04-18 DIAGNOSIS — Z8639 Personal history of other endocrine, nutritional and metabolic disease: Secondary | ICD-10-CM

## 2018-04-18 DIAGNOSIS — Z130 Encounter for screening for diseases of the blood and blood-forming organs and certain disorders involving the immune mechanism: Secondary | ICD-10-CM

## 2018-04-18 DIAGNOSIS — E669 Obesity, unspecified: Secondary | ICD-10-CM

## 2018-04-18 DIAGNOSIS — Z6839 Body mass index (BMI) 39.0-39.9, adult: Secondary | ICD-10-CM

## 2018-04-18 NOTE — Progress Notes (Signed)
Patient: Alexandra Fleming, Female    DOB: Nov 07, 1990, 28 y.o.   MRN: [id number] Visit Date: 04/18/2018  Today's Provider: Trey Sailors, PA-C   Chief Complaint  Patient presents with  . Annual Exam   Subjective:     Annual physical exam Alexandra Fleming is a 28 y.o. female who presents today for health maintenance and complete physical. She feels well. She reports exercising none. She reports she is sleeping well. She has since sold her house and bought new house with husband.  04/15/17 Pap-neg, currently using Mirena IUD  Elevated BP: Patient's BP readings have been elevated the past two office visits. She does not check her Bps outside of the office. Her father does have HTN dx at unknown age. She denies chest pain, SOB. She has gained 48 pounds since 07/01/2017. She reports she has been off of her dietary and exercise habits since moving. She denies use of nasal decongestants.   BP Readings from Last 3 Encounters:  04/18/18 (!) 148/108  01/18/18 (!) 140/100  07/01/17 112/70   Wt Readings from Last 3 Encounters:  04/18/18 237 lb 9.6 oz (107.8 kg)  01/18/18 221 lb 12.8 oz (100.6 kg)  07/01/17 189 lb (85.7 kg)    Last visit, her lexapro was increased to 20 mg daily and she was given hydroxyzine PRN. She reports this has helped her anxiety greatly and she would like to continue this dosage of medication.  -----------------------------------------------------------------   Review of Systems  Constitutional: Negative.   HENT: Negative.   Eyes: Negative.   Respiratory: Negative.   Cardiovascular: Negative.   Gastrointestinal: Negative.   Endocrine: Negative.   Genitourinary: Negative.   Musculoskeletal: Negative.   Skin: Negative.   Allergic/Immunologic: Negative.   Neurological: Negative.   Hematological: Negative.   Psychiatric/Behavioral: Negative.     Social History      She  reports that she has never smoked. She has never used  smokeless tobacco. She reports current alcohol use of about 6.0 standard drinks of alcohol per week. She reports that she does not use drugs.       Social History   Socioeconomic History  . Marital status: Married    Spouse name: Not on file  . Number of children: Not on file  . Years of education: Not on file  . Highest education level: Not on file  Occupational History  . Not on file  Social Needs  . Financial resource strain: Not on file  . Food insecurity:    Worry: Not on file    Inability: Not on file  . Transportation needs:    Medical: Not on file    Non-medical: Not on file  Tobacco Use  . Smoking status: Never Smoker  . Smokeless tobacco: Never Used  Substance and Sexual Activity  . Alcohol use: Yes    Alcohol/week: 6.0 standard drinks    Types: 2 Glasses of wine, 4 Cans of beer per week  . Drug use: No  . Sexual activity: Not on file  Lifestyle  . Physical activity:    Days per week: Not on file    Minutes per session: Not on file  . Stress: Not on file  Relationships  . Social connections:    Talks on phone: Not on file    Gets together: Not on file    Attends religious service: Not on file    Active member of club or organization: Not on file  Attends meetings of clubs or organizations: Not on file    Relationship status: Not on file  Other Topics Concern  . Not on file  Social History Narrative  . Not on file    History reviewed. No pertinent past medical history.   Patient Active Problem List   Diagnosis Date Noted  . Anxiety 03/18/2017    Past Surgical History:  Procedure Laterality Date  . WISDOM TOOTH EXTRACTION      Family History        Family Status  Relation Name Status  . Mother  Alive  . Father  Alive  . MGM  Alive  . MGF  Deceased  . PGM  Alive  . PGF  (Not Specified)  . Emelda Brothers  (Not Specified)  . Mat Uncle  (Not Specified)        Her family history includes Allergies in her mother; Anxiety disorder in her paternal  aunt; Breast cancer (age of onset: 77) in her mother; COPD in her maternal grandfather; Diabetes in her maternal grandmother; High Cholesterol in her father; Hyperlipidemia in her paternal grandfather and paternal grandmother; Hypertension in her father, maternal grandmother, maternal uncle, paternal aunt, paternal grandfather, and paternal grandmother; Sleep apnea in her father and paternal grandfather; Thyroid disease in her paternal grandmother.      Allergies  Allergen Reactions  . Amoxicillin   . Penicillins      Current Outpatient Medications:  .  escitalopram (LEXAPRO) 20 MG tablet, TAKE 1 TABLET BY MOUTH EVERY DAY, Disp: 90 tablet, Rfl: 0 .  hydrOXYzine (ATARAX/VISTARIL) 10 MG tablet, TAKE 1 TABLET BY MOUTH EVERY DAY AS NEEDED, Disp: 90 tablet, Rfl: 0 .  levonorgestrel (MIRENA) 20 MCG/24HR IUD, 1 each by Intrauterine route once., Disp: , Rfl:  .  cholecalciferol (VITAMIN D) 1000 units tablet, Take 1,000 Units by mouth daily., Disp: , Rfl:    Patient Care Team: Maryella Shivers as PCP - General (Physician Assistant)    Objective:    Vitals: BP (!) 148/108 (BP Location: Left Arm, Patient Position: Sitting, Cuff Size: Normal)   Pulse 92   Temp 98.4 F (36.9 C) (Oral)   Resp 16   Ht 5\' 5"  (1.651 m)   Wt 237 lb 9.6 oz (107.8 kg)   BMI 39.54 kg/m    Vitals:   04/18/18 0923 04/18/18 0946  BP: (!) 154/121 (!) 148/108  Pulse: 92   Resp: 16   Temp: 98.4 F (36.9 C)   TempSrc: Oral   Weight: 237 lb 9.6 oz (107.8 kg)   Height: 5\' 5"  (1.651 m)      Physical Exam Constitutional:      Appearance: Normal appearance. She is obese.  HENT:     Right Ear: Tympanic membrane and ear canal normal.     Left Ear: Tympanic membrane and ear canal normal.     Mouth/Throat:     Mouth: Mucous membranes are moist.     Pharynx: Oropharynx is clear.  Cardiovascular:     Rate and Rhythm: Normal rate and regular rhythm.     Pulses: Normal pulses.     Heart sounds: Normal heart  sounds.  Pulmonary:     Effort: Pulmonary effort is normal.     Breath sounds: Normal breath sounds.  Chest:     Breasts: Breasts are symmetrical.        Right: Normal.        Left: Normal.  Abdominal:     General: Abdomen  is flat. Bowel sounds are normal.  Neurological:     Mental Status: She is alert and oriented to person, place, and time. Mental status is at baseline.  Psychiatric:        Mood and Affect: Mood normal.        Behavior: Behavior normal.      Depression Screen PHQ 2/9 Scores 04/18/2018 01/18/2018 03/23/2017  PHQ - 2 Score 0 2 4  PHQ- 9 Score 2 6 13        Assessment & Plan:     Routine Health Maintenance and Physical Exam  Exercise Activities and Dietary recommendations Goals   None     Immunization History  Administered Date(s) Administered  . Hepatitis B 12/04/1999, 01/08/2000, 06/17/2000  . Influenza,inj,Quad PF,6+ Mos 01/18/2018  . Meningococcal Conjugate 06/03/2006  . Tdap 04/15/2017    Health Maintenance  Topic Date Due  . PAP-Cervical Cytology Screening  04/15/2020  . PAP SMEAR-Modifier  04/15/2020  . TETANUS/TDAP  04/16/2027  . INFLUENZA VACCINE  Completed  . HIV Screening  Completed     Discussed health benefits of physical activity, and encouraged her to engage in regular exercise appropriate for her age and condition.   1. Annual physical exam  UTD on PAP and vaccinations.  2. History of vitamin D deficiency  Low last year, not taking supplements or multivitamin.   - VITAMIN D 25 Hydroxy (Vit-D Deficiency, Fractures)  3. Screening, lipid  - Lipid Panel With LDL/HDL Ratio  4. Screening for thyroid disorder  - TSH  5. Screening for deficiency anemia  - CBC with Differential/Platelet  6. Diabetes mellitus screening  - Comprehensive metabolic panel  7. Anxiety  Stable on 20 mg lexapro, continue.  8. Elevated BP without diagnosis of hypertension  Has been elevated past two office visits, patient would like  to try diet and lifestyle modifications and return in 4 months to check. Have advised her to monitor BP at home and return sooner if consistently very high.  9. Class 2 obesity with body mass index (BMI) of 39.0 to 39.9 in adult, unspecified obesity type, unspecified whether serious comorbidity present  Increasing. Patient is working on dietary and lifestyle interventions at this point.  Return in about 4 weeks (around 05/16/2018) for BP.  The entirety of the information documented in the History of Present Illness, Review of Systems and Physical Exam were personally obtained by me. Portions of this information were initially documented by Rondel Baton, CMA and reviewed by me for thoroughness and accuracy.    --------------------------------------------------------------------    Trey Sailors, PA-C  [facility]

## 2018-04-18 NOTE — Patient Instructions (Signed)

## 2018-04-19 ENCOUNTER — Other Ambulatory Visit: Payer: Self-pay | Admitting: Physician Assistant

## 2018-04-19 ENCOUNTER — Telehealth: Payer: Self-pay | Admitting: *Deleted

## 2018-04-19 DIAGNOSIS — F419 Anxiety disorder, unspecified: Secondary | ICD-10-CM

## 2018-04-19 LAB — COMPREHENSIVE METABOLIC PANEL
ALT: 20 IU/L (ref 0–32)
AST: 19 IU/L (ref 0–40)
Albumin/Globulin Ratio: 1.6 (ref 1.2–2.2)
Albumin: 4.2 g/dL (ref 3.9–5.0)
Alkaline Phosphatase: 78 IU/L (ref 39–117)
BUN/Creatinine Ratio: 19 (ref 9–23)
BUN: 14 mg/dL (ref 6–20)
Bilirubin Total: 0.3 mg/dL (ref 0.0–1.2)
CO2: 22 mmol/L (ref 20–29)
Calcium: 8.7 mg/dL (ref 8.7–10.2)
Chloride: 102 mmol/L (ref 96–106)
Creatinine, Ser: 0.75 mg/dL (ref 0.57–1.00)
GFR calc Af Amer: 125 mL/min/{1.73_m2} (ref >59)
GFR calc non Af Amer: 108 mL/min/{1.73_m2} (ref >59)
Globulin, Total: 2.7 g/dL (ref 1.5–4.5)
Glucose: 84 mg/dL (ref 65–99)
Potassium: 4.3 mmol/L (ref 3.5–5.2)
Sodium: 136 mmol/L (ref 134–144)
Total Protein: 6.9 g/dL (ref 6.0–8.5)

## 2018-04-19 LAB — CBC WITH DIFFERENTIAL/PLATELET
Basophils Absolute: 0 10*3/uL (ref 0.0–0.2)
Basos: 0 %
EOS (ABSOLUTE): 0.1 10*3/uL (ref 0.0–0.4)
Eos: 2 %
Hematocrit: 40.5 % (ref 34.0–46.6)
Hemoglobin: 13.7 g/dL (ref 11.1–15.9)
Immature Grans (Abs): 0 10*3/uL (ref 0.0–0.1)
Immature Granulocytes: 0 %
Lymphocytes Absolute: 1.6 10*3/uL (ref 0.7–3.1)
Lymphs: 24 %
MCH: 30 pg (ref 26.6–33.0)
MCHC: 33.8 g/dL (ref 31.5–35.7)
MCV: 89 fL (ref 79–97)
Monocytes Absolute: 0.5 10*3/uL (ref 0.1–0.9)
Monocytes: 8 %
Neutrophils Absolute: 4.4 10*3/uL (ref 1.4–7.0)
Neutrophils: 66 %
Platelets: 304 10*3/uL (ref 150–450)
RBC: 4.56 x10E6/uL (ref 3.77–5.28)
RDW: 12.1 % (ref 11.7–15.4)
WBC: 6.7 10*3/uL (ref 3.4–10.8)

## 2018-04-19 LAB — TSH: TSH: 2.29 u[IU]/mL (ref 0.450–4.500)

## 2018-04-19 LAB — LIPID PANEL WITH LDL/HDL RATIO
Cholesterol, Total: 172 mg/dL (ref 100–199)
HDL: 58 mg/dL (ref >39)
LDL Calculated: 99 mg/dL (ref 0–99)
LDl/HDL Ratio: 1.7 ratio (ref 0.0–3.2)
Triglycerides: 75 mg/dL (ref 0–149)
VLDL Cholesterol Cal: 15 mg/dL (ref 5–40)

## 2018-04-19 LAB — VITAMIN D 25 HYDROXY (VIT D DEFICIENCY, FRACTURES): Vit D, 25-Hydroxy: 19 ng/mL — ABNORMAL LOW (ref 30.0–100.0)

## 2018-04-19 NOTE — Telephone Encounter (Signed)
-----   Message from Trey Sailors, [place] sent at 04/19/2018  8:50 AM EST ----- Labs all look normal except for vitamin D which is low. Recommend taking 1000 IU vitamin D daily.

## 2018-04-19 NOTE — Telephone Encounter (Signed)
LMOVM for pt to return call 

## 2018-04-19 NOTE — Telephone Encounter (Signed)
° °  escitalopram (LEXAPRO) 20 MG tablet  This medication needs to be called into:  [id number] - [place], [place] - [address] AT [facility] OF [address] [phone] (Phone) [phone] (Fax)   Thanks, [employer]

## 2018-04-19 NOTE — Telephone Encounter (Signed)
Informed patient of results. Patient expresses understanding, will take 1000 IU vitamin D daily.

## 2018-04-20 MED ORDER — ESCITALOPRAM OXALATE 20 MG PO TABS: 20.0000 mg | ORAL_TABLET | Freq: Every day | ORAL | 3 refills | Status: DC

## 2018-05-16 ENCOUNTER — Other Ambulatory Visit: Payer: Self-pay | Admitting: Physician Assistant

## 2018-05-16 DIAGNOSIS — F419 Anxiety disorder, unspecified: Secondary | ICD-10-CM

## 2018-07-13 ENCOUNTER — Other Ambulatory Visit: Payer: Self-pay | Admitting: Physician Assistant

## 2018-07-13 DIAGNOSIS — F419 Anxiety disorder, unspecified: Secondary | ICD-10-CM

## 2018-07-13 NOTE — Telephone Encounter (Signed)
Pharmacy requesting refills. Thanks!  

## 2018-08-19 ENCOUNTER — Other Ambulatory Visit: Payer: Self-pay | Admitting: Physician Assistant

## 2018-08-19 DIAGNOSIS — F419 Anxiety disorder, unspecified: Secondary | ICD-10-CM

## 2018-09-11 ENCOUNTER — Ambulatory Visit: Payer: Self-pay | Admitting: Physician Assistant

## 2018-11-13 ENCOUNTER — Other Ambulatory Visit: Payer: Self-pay | Admitting: Physician Assistant

## 2018-11-13 DIAGNOSIS — F419 Anxiety disorder, unspecified: Secondary | ICD-10-CM

## 2019-03-09 NOTE — Progress Notes (Signed)
Patient: Alexandra Fleming Female    DOB: [birth year]   28 y.o.   MRN: [id number] Visit Date: 03/12/2019  Today's Provider: Trey Sailors, PA-C   Chief Complaint  Patient presents with  . Hypertension  . Anxiety   Subjective:    I, Porsha McClurkin,CMA am acting as a Neurosurgeon for [place].  Virtual Visit via Telephone Note  I connected with Alexandra Fleming on 03/12/19 at  3:20 PM EST by telephone and verified that I am speaking with the correct person using two identifiers.  Location: Patient: Home Provider: Office  I discussed the limitations, risks, security and privacy concerns of performing an evaluation and management service by telephone and the availability of in person appointments. I also discussed with the patient that there may be a patient responsible charge related to this service. The patient expressed understanding and agreed to proceed.  HPI  Hypertension, follow-up:  BP Readings from Last 3 Encounters:  03/12/19 (!) 160/122  04/18/18 (!) 148/108  01/18/18 (!) 140/100    She was last seen for hypertension 11 months ago.  BP at that visit was 148/108. Management changes since that visit include no changes. She reports good compliance with treatment. She is not exercising. She is adherent to low salt diet.   Outside blood pressures are elevated. She is experiencing none.   Weight trend: increasing steadily Wt Readings from Last 3 Encounters:  04/18/18 237 lb 9.6 oz (107.8 kg)  01/18/18 221 lb 12.8 oz (100.6 kg)  07/01/17 189 lb (85.7 kg)    Current diet: well balanced  ------------------------------------------------------------------------  Anxiety Patient presents today for anxiety follow-up. Patient was last seen on 04/18/2018. Patient is currently taking Lexapro 20 MG along with Hydroxyzine 10 MG PRN. She felt prior to the pandemic her anxiety had improved. 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   GAD 7 : Generalized Anxiety Score 03/12/2019 01/18/2018  Nervous, Anxious, on Edge 3 3  Control/stop worrying 3 3  Worry too much - different things 3 3  Trouble relaxing 3 3  Restless 3 2  Easily annoyed or irritable 2 3  Afraid - awful might happen 3 3  Total GAD 7 Score 20 20  Anxiety Difficulty Very difficult Somewhat difficult      Allergies  Allergen Reactions  . Amoxicillin   . Penicillins      Current Outpatient Medications:  .  cholecalciferol (VITAMIN D) 1000 units tablet, Take 1,000 Units by mouth daily., Disp: , Rfl:  .  escitalopram (LEXAPRO) 20 MG tablet, TAKE 1 TABLET BY MOUTH EVERY DAY, Disp: 90 tablet, Rfl: 3 .  hydrOXYzine (ATARAX/VISTARIL) 10 MG tablet, TAKE 1 TABLET BY MOUTH EVERY DAY AS NEEDED, Disp: 90 tablet, Rfl: 0 .  levonorgestrel (MIRENA) 20 MCG/24HR IUD, 1 each by Intrauterine route once., Disp: , Rfl:   Review of Systems  Constitutional: Negative.   Respiratory: Negative.   Cardiovascular: Negative.   Genitourinary: Negative.   Hematological: Negative.   Psychiatric/Behavioral: Positive for agitation. Negative for behavioral problems, confusion, decreased concentration and sleep disturbance. The patient is nervous/anxious.     Social History   Tobacco Use  . Smoking status: Never Smoker  . Smokeless tobacco: Never Used  Substance Use Topics  . Alcohol use: Yes    Alcohol/week: 6.0 standard drinks    Types: 2 Glasses of wine, 4 Cans of beer  per week      Objective:   BP (!) 160/122 (BP Location: Left Arm, Patient Position: Sitting, Cuff Size: Normal)   Pulse 92  Vitals:   03/12/19 1503  BP: (!) 160/122  Pulse: 92  There is no height or weight on file to calculate BMI.   Physical Exam   No results found for any visits on 03/12/19.     Assessment & Plan    1. Anxiety  Start buspar as below in addition to lexapro. Follow up 1-2 months for BP and  anxiety.   - Ambulatory referral to Chronic Care Management Services - busPIRone (BUSPAR) 7.5 MG tablet; Take 1 tablet (7.5 mg total) by mouth 2 (two) times daily.  Dispense: 180 tablet; Refill: 0  2. Hypertension, unspecified type  Start as below. Counseled on side effects.   - amLODipine (NORVASC) 10 MG tablet; Take 5 mg daily for the first week. Then take 10 mg daily onward.  Dispense: 90 tablet; Refill: 0 I discussed the assessment and treatment plan with the patient. The patient was provided an opportunity to ask questions and all were answered. The patient agreed with the plan and demonstrated an understanding of the instructions.   The patient was advised to call back or seek an in-person evaluation if the symptoms worsen or if the condition fails to improve as anticipated.  The entirety of the information documented in the History of Present Illness, Review of Systems and Physical Exam were personally obtained by me. Portions of this information were initially documented by [facility] and reviewed by me for thoroughness and accuracy.   I provided 25 minutes of non-face-to-face time during this encounter.     Trinna Post, PA-C  [facility]

## 2019-03-12 ENCOUNTER — Ambulatory Visit (INDEPENDENT_AMBULATORY_CARE_PROVIDER_SITE_OTHER): Payer: BC Managed Care – PPO | Admitting: Physician Assistant

## 2019-03-12 ENCOUNTER — Encounter: Payer: Self-pay | Admitting: Physician Assistant

## 2019-03-12 VITALS — BP 160/122 | HR 92

## 2019-03-12 DIAGNOSIS — F419 Anxiety disorder, unspecified: Secondary | ICD-10-CM

## 2019-03-12 DIAGNOSIS — I10 Essential (primary) hypertension: Secondary | ICD-10-CM | POA: Diagnosis not present

## 2019-03-12 MED ORDER — BUSPIRONE HCL 7.5 MG PO TABS: 7.5000 mg | ORAL_TABLET | Freq: Two times a day (BID) | ORAL | 0 refills | Status: AC

## 2019-03-12 MED ORDER — AMLODIPINE BESYLATE 10 MG PO TABS: ORAL_TABLET | ORAL | 0 refills | Status: DC

## 2019-03-23 ENCOUNTER — Ambulatory Visit: Payer: Self-pay | Admitting: *Deleted

## 2019-03-23 NOTE — Chronic Care Management (AMB) (Signed)
    Care Management   Unsuccessful Call Note 03/23/2019 Name: Alexandra Fleming MRN: [id number] DOB: 04-30-1990  Patient is a 29 year old female  who sees Osvaldo Angst, [place] for primary care. Osvaldo Angst PA-C asked the CCM team to consult the patient for Mental health Counseling and Resources.  Referral was placed 03/12/19. Patient's last office visit was 03/12/19.     This social worker was unable to reach patient via telephone today to introduce CCM services and to obtain consent. I have left HIPAA compliant voicemail asking patient to return my call. (unsuccessful outreach #1).   Plan: Will follow-up within 7 business days via telephone.      Verna Czech, LCSW Clinical Social Worker  [facility] Care Management [phone]

## 2019-03-29 ENCOUNTER — Encounter: Payer: Self-pay | Admitting: Family Medicine

## 2019-03-29 ENCOUNTER — Ambulatory Visit (INDEPENDENT_AMBULATORY_CARE_PROVIDER_SITE_OTHER): Payer: BC Managed Care – PPO | Admitting: Family Medicine

## 2019-03-29 VITALS — Temp 98.5°F

## 2019-03-29 DIAGNOSIS — Z20822 Contact with and (suspected) exposure to covid-19: Secondary | ICD-10-CM | POA: Diagnosis not present

## 2019-03-29 NOTE — Progress Notes (Signed)
Patient: Alexandra Fleming Female    DOB: [birth year]   29 y.o.   MRN: [id number] Visit Date: 03/29/2019  Today's Provider: Shirlee Latch, MD   Chief Complaint  Patient presents with  . Cough   Subjective:    Virtual Visit via Video Note  I connected with Alexandra Fleming on 03/29/19 at 10:20 AM EST by a video enabled telemedicine application and verified that I am speaking with the correct person using two identifiers.  Location: Patient location: home Provider location: [facility] Persons involved in the visit: patient, provider   I discussed the limitations of evaluation and management by telemedicine and the availability of in person appointments. The patient expressed understanding and agreed to proceed.  Cough This is a new problem. The current episode started in the past 7 days. The problem has been gradually worsening. The problem occurs every few minutes. The cough is non-productive. Associated symptoms include chills, a fever, headaches, myalgias, a sore throat and shortness of breath. Pertinent negatives include no ear congestion, ear pain, nasal congestion, postnasal drip, rhinorrhea or wheezing. Exacerbated by: activity. She has tried nothing for the symptoms. There is no history of asthma.   1/31 symptoms started Non-productive cough +extreme fatigue and headache, myalgias SOB with exertion, not at rest Sense of taste and smell is dulled  COVID test at [facility] on 2/1 and it was negative  Works as a Runner, broadcasting/film/video in person  Allergies  Allergen Reactions  . Amoxicillin   . Penicillins      Current Outpatient Medications:  .  amLODipine (NORVASC) 10 MG tablet, Take 5 mg daily for the first week. Then take 10 mg daily onward., Disp: 90 tablet, Rfl: 0 .  busPIRone (BUSPAR) 7.5 MG tablet, Take 1 tablet (7.5 mg total) by mouth 2 (two) times daily., Disp: 180 tablet, Rfl: 0 .  escitalopram (LEXAPRO) 20 MG tablet, TAKE 1 TABLET BY  MOUTH EVERY DAY, Disp: 90 tablet, Rfl: 3 .  hydrOXYzine (ATARAX/VISTARIL) 10 MG tablet, TAKE 1 TABLET BY MOUTH EVERY DAY AS NEEDED, Disp: 90 tablet, Rfl: 0 .  levonorgestrel (MIRENA) 20 MCG/24HR IUD, 1 each by Intrauterine route once., Disp: , Rfl:   Review of Systems  Constitutional: Positive for chills and fever.  HENT: Positive for sore throat. Negative for ear pain, postnasal drip and rhinorrhea.   Respiratory: Positive for cough and shortness of breath. Negative for wheezing.   Musculoskeletal: Positive for myalgias.  Neurological: Positive for headaches.    Social History   Tobacco Use  . Smoking status: Never Smoker  . Smokeless tobacco: Never Used  Substance Use Topics  . Alcohol use: Yes    Alcohol/week: 6.0 standard drinks    Types: 2 Glasses of wine, 4 Cans of beer per week      Objective:   Temp 98.5 F (36.9 C) (Oral)  Vitals:   03/29/19 0954  Temp: 98.5 F (36.9 C)  TempSrc: Oral  There is no height or weight on file to calculate BMI.   Physical Exam Constitutional:      General: She is not in acute distress.    Appearance: Normal appearance. She is not diaphoretic.  HENT:     Head: Normocephalic and atraumatic.  Pulmonary:     Effort: Pulmonary effort is normal. No respiratory distress.     Comments: Intermittent cough Neurological:     Mental Status: She is alert and oriented to person, place, and time. Mental status is at  baseline.      No results found for any visits on 03/29/19.     Assessment & Plan    I discussed the assessment and treatment plan with the patient. The patient was provided an opportunity to ask questions and all were answered. The patient agreed with the plan and demonstrated an understanding of the instructions.   The patient was advised to call back or seek an in-person evaluation if the symptoms worsen or if the condition fails to improve as anticipated.  1. Suspected COVID-19 virus infection - symptoms and exam c/w  viral URI  - doubt strep pharyngitis, CAP, bacterial sinusitis, or other bacterial infection - concerning for possible COVID19 infection - suspect that self-collected COVID swab was a false negative - discussed question of re-testing, and as I believe we will have her isolate regardless, she may not need to do this unless required by her job - discussed need to self-isolate, along with all household members, for 10 days from start of illness and until fever free for at least 48 hours. - discussed symptomatic management, natural course, and return precautions      Return if symptoms worsen or fail to improve.   The entirety of the information documented in the History of Present Illness, Review of Systems and Physical Exam were personally obtained by me. Portions of this information were initially documented by Alexandra Fleming, CMA and reviewed by me for thoroughness and accuracy.    Alexandra Fleming, Alexandra Bucy, MD MPH [facility]

## 2019-03-29 NOTE — Patient Instructions (Signed)

## 2019-04-03 ENCOUNTER — Encounter: Payer: Self-pay | Admitting: Family Medicine

## 2019-04-03 ENCOUNTER — Encounter: Payer: Self-pay | Admitting: Physician Assistant

## 2019-04-03 ENCOUNTER — Ambulatory Visit: Payer: Self-pay | Admitting: *Deleted

## 2019-04-03 DIAGNOSIS — F419 Anxiety disorder, unspecified: Secondary | ICD-10-CM

## 2019-04-03 NOTE — Telephone Encounter (Signed)
Patient was called and appointment schedule for tomorrow at 9:20 PM.

## 2019-04-04 ENCOUNTER — Ambulatory Visit
Admission: RE | Admit: 2019-04-04 | Discharge: 2019-04-04 | Disposition: A | Discharge status: Home / Self Care | Payer: BC Managed Care – PPO | Source: Ambulatory Visit | Attending: Physician Assistant | Admitting: Physician Assistant

## 2019-04-04 ENCOUNTER — Telehealth (INDEPENDENT_AMBULATORY_CARE_PROVIDER_SITE_OTHER): Payer: BC Managed Care – PPO | Admitting: Physician Assistant

## 2019-04-04 DIAGNOSIS — U071 COVID-19: Secondary | ICD-10-CM

## 2019-04-04 DIAGNOSIS — R918 Other nonspecific abnormal finding of lung field: Secondary | ICD-10-CM | POA: Insufficient documentation

## 2019-04-04 DIAGNOSIS — J1282 Pneumonia due to coronavirus disease 2019: Secondary | ICD-10-CM

## 2019-04-04 DIAGNOSIS — R05 Cough: Secondary | ICD-10-CM | POA: Insufficient documentation

## 2019-04-04 DIAGNOSIS — R059 Cough, unspecified: Secondary | ICD-10-CM

## 2019-04-04 DIAGNOSIS — J189 Pneumonia, unspecified organism: Secondary | ICD-10-CM | POA: Diagnosis not present

## 2019-04-04 DIAGNOSIS — Z20822 Contact with and (suspected) exposure to covid-19: Secondary | ICD-10-CM | POA: Insufficient documentation

## 2019-04-04 MED ORDER — ALBUTEROL SULFATE HFA 108 (90 BASE) MCG/ACT IN AERS: 2.0000 | INHALATION_SPRAY | Freq: Four times a day (QID) | RESPIRATORY_TRACT | 0 refills | Status: DC | PRN

## 2019-04-04 MED ORDER — AZITHROMYCIN 250 MG PO TABS: ORAL_TABLET | ORAL | 0 refills | Status: DC

## 2019-04-04 NOTE — Progress Notes (Signed)
Patient: Alexandra Fleming Female    DOB: 24-Aug-1990   29 y.o.   MRN: [id number] Visit Date: 04/04/2019  Today's Provider: Trinna Post, PA-C   Chief Complaint  Patient presents with  . Elevated Heart Rate   Subjective:    Virtual Visit via Video Note  I connected with Alexandra Fleming on 04/04/19 at  9:20 AM EST by a video enabled telemedicine application and verified that I am speaking with the correct person using two identifiers.  Location: Patient: Home Provider: Office    I discussed the limitations of evaluation and management by telemedicine and the availability of in person appointments. The patient expressed understanding and agreed to proceed.  HPI  Elevated Heart Rate  Patient was treated for presumed positive COVID on 03/29/2019 after negative test on 03/26/2019 was thought to be false negative. After the visit she lost taste and smell. Patient presents today for elevated heart rate for the past couple of days. Patient stated in a mychart message that she could be sitting down and would feel her heart racing. She reports she is having some SOB with activity such as walking across the house and also some slight SOB while at rest. She is having some cough, especially in the morning.   Pulse Readings from Last 3 Encounters:  03/12/19 92  04/18/18 92  01/18/18 88     Allergies  Allergen Reactions  . Amoxicillin   . Penicillins      Current Outpatient Medications:  .  amLODipine (NORVASC) 10 MG tablet, Take 5 mg daily for the first week. Then take 10 mg daily onward., Disp: 90 tablet, Rfl: 0 .  busPIRone (BUSPAR) 7.5 MG tablet, Take 1 tablet (7.5 mg total) by mouth 2 (two) times daily., Disp: 180 tablet, Rfl: 0 .  escitalopram (LEXAPRO) 20 MG tablet, TAKE 1 TABLET BY MOUTH EVERY DAY, Disp: 90 tablet, Rfl: 3 .  hydrOXYzine (ATARAX/VISTARIL) 10 MG tablet, TAKE 1 TABLET BY MOUTH EVERY DAY AS NEEDED, Disp: 90 tablet, Rfl: 0 .  levonorgestrel  (MIRENA) 20 MCG/24HR IUD, 1 each by Intrauterine route once., Disp: , Rfl:   Review of Systems  Constitutional: Negative.   Respiratory: Negative.   Cardiovascular: Negative.   Genitourinary: Negative.   Neurological: Negative.     Social History   Tobacco Use  . Smoking status: Never Smoker  . Smokeless tobacco: Never Used  Substance Use Topics  . Alcohol use: Yes    Alcohol/week: 6.0 standard drinks    Types: 2 Glasses of wine, 4 Cans of beer per week      Objective:   There were no vitals taken for this visit. There were no vitals filed for this visit.There is no height or weight on file to calculate BMI.   Physical Exam Constitutional:      Appearance: Normal appearance. She is not ill-appearing or toxic-appearing.  Pulmonary:     Effort: Pulmonary effort is normal. No respiratory distress.  Neurological:     Mental Status: She is alert.  Psychiatric:        Mood and Affect: Mood normal.        Behavior: Behavior normal.      No results found for any visits on 04/04/19.     Assessment & Plan    1. Pneumonia due to COVID-19 virus  CXR does show pneumonia. Will treat as below. Will order follow up CXR today that she can get in one month. Work  note provided.   - azithromycin (ZITHROMAX) 250 MG tablet; Take 2 pills on day 1, take 1 pill each of the following four days.  Dispense: 6 tablet; Refill: 0  2. Cough  - DG Chest 2 View; Future - albuterol (VENTOLIN HFA) 108 (90 Base) MCG/ACT inhaler; Inhale 2 puffs into the lungs every 6 (six) hours as needed for wheezing or shortness of breath.  Dispense: 6.7 g; Refill: 0  3. Lingular pneumonia  - azithromycin (ZITHROMAX) 250 MG tablet; Take 2 pills on day 1, take 1 pill each of the following four days.  Dispense: 6 tablet; Refill: 0  I discussed the assessment and treatment plan with the patient. The patient was provided an opportunity to ask questions and all were answered. The patient agreed with the plan and  demonstrated an understanding of the instructions.   The patient was advised to call back or seek an in-person evaluation if the symptoms worsen or if the condition fails to improve as anticipated.  I provided 25 minutes of non-face-to-face time during this encounter.     Trey Sailors, PA-C  [facility]

## 2019-04-04 NOTE — Chronic Care Management (AMB) (Signed)
  Chronic Care Management    Clinical Social Work General Note  04/04/2019 Name: Alexandra Fleming MRN: [id number] DOB: 02-Jun-1990  Alexandra Fleming is a 29 y.o. year old female who is a primary care patient of Trey Sailors, [place]. The CM was consulted to assist the patient with Mental Health Counseling and Resources.   Alexandra Fleming was given information about Care Management services today including:  1. CM service includes personalized support from designated clinical staff supervised by her physician, including individualized plan of care and coordination with other care providers 2. 24/7 contact phone numbers for assistance for urgent and routine care needs. 3. The patient may stop CM services at any time (effective at the end of the month) by phone call to the office staff.  Patient agreed to services and verbal consent obtained.   Review of patient status, including review of consultants reports, relevant laboratory and other test results, and collaboration with appropriate care team members and the patient's provider was performed as part of comprehensive patient evaluation and provision of chronic care management services.    SDOH (Social Determinants of Health) screening performed today. See Care Plan Entry related to challenges with: Physical Activity  Outpatient Encounter Medications as of 04/03/2019  Medication Sig  . amLODipine (NORVASC) 10 MG tablet Take 5 mg daily for the first week. Then take 10 mg daily onward.  . busPIRone (BUSPAR) 7.5 MG tablet Take 1 tablet (7.5 mg total) by mouth 2 (two) times daily.  Marland Kitchen escitalopram (LEXAPRO) 20 MG tablet TAKE 1 TABLET BY MOUTH EVERY DAY  . hydrOXYzine (ATARAX/VISTARIL) 10 MG tablet TAKE 1 TABLET BY MOUTH EVERY DAY AS NEEDED  . levonorgestrel (MIRENA) 20 MCG/24HR IUD 1 each by Intrauterine route once.   No facility-administered encounter medications on file as of 04/03/2019.    Goals Addressed            This  Visit's Progress   . "I need to find a counselor  for my anxiety (pt-stated)       Current Barriers:  Marland Kitchen Mental Health Concerns   Clinical Social Work Clinical Goal(s):  Marland Kitchen Over the next 90 days, patient will work with her insurance company and her employee assistance program  to address needs related to mental health follow up.  Interventions: . Patient interviewed and appropriate assessments performed . Provided supportive counseling and emotional support with regard to struggles with anxiety  . Discussed options for mental health follow up due to patient's statement of not being able to afford the co-pay . Provided patient with information about the possibility of looking into her Employee Assistance Program and her insurance company for in [employer] . Discussed plans with patient for ongoing care management follow up and provided patient with direct contact information for care management team . Advised patient to patient follow up with her Employee Assistance Program and her insurance company for in network providers  Patient Self Care Activities:  . Patient verbalizes understanding of plan to follow up with her Employee Assistance Program and her insurance company for a list of in network providers  . Lack of knowledge regarding affordable mental health options  Initial goal documentation         Follow Up Plan: SW will follow up with patient by phone over the next 7-10 business days        [place], [place] Clinical Social Worker  [facility] Care Management [phone]

## 2019-04-04 NOTE — Patient Instructions (Addendum)
Thank you allowing the Chronic Care Management Team to be a part of your care! It was a pleasure speaking with you today!  1. Please contact your insurance company for a list of in network providers as well as your [place]    CCM (Chronic Care Management) Team   Juanell Fairly RN, BSN Nurse Care Coordinator  [phone]  Karalee Height PharmD  Clinical Pharmacist  [phone]   Verna Czech, LCSW Clinical Social Worker [phone]  Goals Addressed            This Visit's Progress   . "I need to find a counselor  for my anxiety (pt-stated)       Current Barriers:  Marland Kitchen Mental Health Concerns   Clinical Social Work Clinical Goal(s):  Marland Kitchen Over the next 90 days, patient will work with her insurance company and her employee assistance program  to address needs related to mental health follow up.  Interventions: . Patient interviewed and appropriate assessments performed . Provided supportive counseling and emotional support with regard to struggles with anxiety  . Discussed options for mental health follow up due to patient's statement of not being able to afford the co-pay . Provided patient with information about the possibility of looking into her Employee Assistance Program and her insurance company for in [employer] . Discussed plans with patient for ongoing care management follow up and provided patient with direct contact information for care management team . Advised patient to patient follow up with her Employee Assistance Program and her insurance company for in network providers  Patient Self Care Activities:  . Patient verbalizes understanding of plan to follow up with her Employee Assistance Program and her insurance company for a list of in network providers  . Lack of knowledge regarding affordable mental health options  Initial goal documentation         The patient verbalized understanding of instructions provided today and declined a print  copy of patient instruction materials.   Telephone follow up appointment with care management team member scheduled for: 04/11/19

## 2019-04-09 ENCOUNTER — Encounter: Payer: Self-pay | Admitting: Physician Assistant

## 2019-04-11 ENCOUNTER — Telehealth: Payer: Self-pay | Admitting: *Deleted

## 2019-04-12 ENCOUNTER — Ambulatory Visit: Payer: Self-pay | Admitting: *Deleted

## 2019-04-12 DIAGNOSIS — F419 Anxiety disorder, unspecified: Secondary | ICD-10-CM

## 2019-04-12 NOTE — Patient Instructions (Addendum)
Thank you allowing the Chronic Care Management Team to be a part of your care! It was a pleasure speaking with you today!  1. Please contact your Catering manager as well as your insurance company regarding your mental health needs  CCM (Chronic Care Management) Team   Juanell Fairly  RN, BSN Nurse Care Coordinator  [phone]  Karalee Height PharmD  Clinical Pharmacist  [phone]   Verna Czech, LCSW Clinical Social Worker [phone]  Goals Addressed            This Visit's Progress   . "I need to find a counselor  for my anxiety (pt-stated)       Current Barriers:  Marland Kitchen Mental Health Concerns   Clinical Social Work Clinical Goal(s):  Marland Kitchen Over the next 90 days, patient will work with her insurance company and her employee assistance program  to address needs related to mental health follow up.  Interventions: . Followed up with patient regarding contact with her Employee Assistance Program and her insurance company for in network mental health providers . Patient discussed recently testing positive for COVID-19 and now has pneumonia . Patient discussed that due to her current medical condition, she has not had a moment to make any phone calls regarding mental health providers . Reinforced need to take care of herself physically and recommended that she focus on getting better medically . Discussed plan to follow up with patient in approximately 3 weeks regarding her mental health needs . Discussed plans with patient for ongoing care management follow up and provided patient with direct contact information for care management team  Patient Self Care Activities:  . Patient verbalizes understanding of plan to follow up with her Employee Assistance Program and her insurance company for a list of in network providers  . Lack of knowledge regarding affordable mental health options  Please see past updates related to this goal by clicking on the "Past Updates"  button in the selected goal          The patient verbalized understanding of instructions provided today and declined a print copy of patient instruction materials.   Telephone follow up appointment with care management team member scheduled for: 05/09/19

## 2019-04-12 NOTE — Chronic Care Management (AMB) (Addendum)
   Care Management    Clinical Social Work Follow Up Note  04/12/2019 Name: Alexandra Fleming MRN: [id number] DOB: 01-01-1991  Alexandra Fleming is a 29 y.o. year old female who is a primary care patient of Alexandra Fleming, [place]. The CCM team was consulted for assistance with Mental Health Counseling and Resources.   Review of patient status, including review of consultants reports, other relevant assessments, and collaboration with appropriate care team members and the patient's provider was performed as part of comprehensive patient evaluation and provision of chronic care management services.    SDOH (Social Determinants of Health) assessments performed: Yes    Advanced Directives Status: <no information> See Care Plan for related entries.   Outpatient Encounter Medications as of 04/12/2019  Medication Sig  . albuterol (VENTOLIN HFA) 108 (90 Base) MCG/ACT inhaler Inhale 2 puffs into the lungs every 6 (six) hours as needed for wheezing or shortness of breath.  Marland Kitchen amLODipine (NORVASC) 10 MG tablet Take 5 mg daily for the first week. Then take 10 mg daily onward.  Marland Kitchen azithromycin (ZITHROMAX) 250 MG tablet Take 2 pills on day 1, take 1 pill each of the following four days.  . busPIRone (BUSPAR) 7.5 MG tablet Take 1 tablet (7.5 mg total) by mouth 2 (two) times daily.  Marland Kitchen escitalopram (LEXAPRO) 20 MG tablet TAKE 1 TABLET BY MOUTH EVERY DAY  . hydrOXYzine (ATARAX/VISTARIL) 10 MG tablet TAKE 1 TABLET BY MOUTH EVERY DAY AS NEEDED  . levonorgestrel (MIRENA) 20 MCG/24HR IUD 1 each by Intrauterine route once.   No facility-administered encounter medications on file as of 04/12/2019.     Goals Addressed            This Visit's Progress   . "I need to find a counselor  for my anxiety (pt-stated)       Current Barriers:  Marland Kitchen Mental Health Concerns   Clinical Social Work Clinical Goal(s):  Marland Kitchen Over the next 90 days, patient will work with her insurance company and her employee  assistance program  to address needs related to mental health follow up.  Interventions: . Followed up with patient regarding contact with her Employee Assistance Program and her insurance company for in network mental health providers . Patient discussed recently testing positive for COVID-19 and now has pneumonia . Patient discussed that due to her current medical condition, she has not had a moment to make any phone calls regarding mental health providers . Reinforced need to take care of herself physically and recommended that she focus on getting better medically . Discussed plan to follow up with patient in approximately 3 weeks regarding her mental health needs . Discussed plans with patient for ongoing care management follow up and provided patient with direct contact information for care management team  Patient Self Care Activities:  . Patient verbalizes understanding of plan to follow up with her Employee Assistance Program and her insurance company for a list of in network providers  . Lack of knowledge regarding affordable mental health options  Please see past updates related to this goal by clicking on the "Past Updates" button in the selected goal          Follow Up Plan: SW will follow up with patient by phone over the next 3 weeks    Alexandra Fleming, [place] Clinical Social Worker  [facility] Care [facility] [phone]

## 2019-04-24 ENCOUNTER — Telehealth (INDEPENDENT_AMBULATORY_CARE_PROVIDER_SITE_OTHER): Payer: BC Managed Care – PPO | Admitting: Physician Assistant

## 2019-04-24 ENCOUNTER — Other Ambulatory Visit: Payer: Self-pay

## 2019-04-24 ENCOUNTER — Ambulatory Visit
Admission: RE | Admit: 2019-04-24 | Discharge: 2019-04-24 | Disposition: A | Discharge status: Home / Self Care | Payer: BC Managed Care – PPO | Source: Ambulatory Visit | Attending: Physician Assistant | Admitting: Physician Assistant

## 2019-04-24 ENCOUNTER — Encounter: Payer: Self-pay | Admitting: Physician Assistant

## 2019-04-24 DIAGNOSIS — G4700 Insomnia, unspecified: Secondary | ICD-10-CM

## 2019-04-24 DIAGNOSIS — I10 Essential (primary) hypertension: Secondary | ICD-10-CM | POA: Diagnosis not present

## 2019-04-24 DIAGNOSIS — J45909 Unspecified asthma, uncomplicated: Secondary | ICD-10-CM

## 2019-04-24 DIAGNOSIS — U071 COVID-19: Secondary | ICD-10-CM | POA: Insufficient documentation

## 2019-04-24 DIAGNOSIS — J1282 Pneumonia due to coronavirus disease 2019: Secondary | ICD-10-CM

## 2019-04-24 MED ORDER — BUDESONIDE-FORMOTEROL FUMARATE 160-4.5 MCG/ACT IN AERO: 2.0000 | INHALATION_SPRAY | Freq: Two times a day (BID) | RESPIRATORY_TRACT | 3 refills | Status: DC

## 2019-04-24 MED ORDER — LOSARTAN POTASSIUM-HCTZ 50-12.5 MG PO TABS: 1.0000 | ORAL_TABLET | Freq: Every day | ORAL | 1 refills | Status: DC

## 2019-04-24 MED ORDER — TRAZODONE HCL 50 MG PO TABS: 25.0000 mg | ORAL_TABLET | Freq: Every evening | ORAL | 3 refills | Status: DC | PRN

## 2019-04-24 NOTE — Progress Notes (Signed)
Patient: Alexandra Fleming Female    DOB: 09-23-1990   29 y.o.   MRN: [id number] Visit Date: 04/24/2019  Today's Provider: Trinna Post, PA-C   Chief Complaint  Patient presents with  . Hypertension  . Anxiety  . Follow-up   Subjective:    Virtual Visit via Video Note  I connected with Alexandra Fleming on 04/24/19 at  9:40 AM EST by a video enabled telemedicine application and verified that I am speaking with the correct person using two identifiers.  Location: Patient: Home Provider: Office   I discussed the limitations of evaluation and management by telemedicine and the availability of in person appointments. The patient expressed understanding and agreed to proceed.  HPI  Hypertension, follow-up:  BP Readings from Last 3 Encounters:  03/12/19 (!) 160/122  04/18/18 (!) 148/108  01/18/18 (!) 140/100    She was last seen for hypertension 1 months ago.  BP at that visit was 160/122. Management changes since that visit include started Amlodipine 10 MG. She reports good compliance with treatment. She is having side effects. She is reporting ankle swelling that is intolerable.  She is not exercising. She is adherent to low salt diet.   Outside blood pressures are not checked. She is experiencing lower extremity edema.  Patient denies chest pain, chest pressure/discomfort, exertional chest pressure/discomfort, fatigue, irregular heart beat, near-syncope and palpitations.   Cardiovascular risk factors include hypertension.  Use of agents associated with hypertension: none.     Weight trend: stable Wt Readings from Last 3 Encounters:  04/18/18 237 lb 9.6 oz (107.8 kg)  01/18/18 221 lb 12.8 oz (100.6 kg)  07/01/17 189 lb (85.7 kg)    Current diet: well balanced  ------------------------------------------------------------------------  Anxiety Patient presents today for anxiety follow-up. Patient last office visit was on  03/12/2019.  Patient is currently taking Buspar 7.5 MG. GAD 7 : Generalized Anxiety Score 03/12/2019 01/18/2018  Nervous, Anxious, on Edge 3 3  Control/stop worrying 3 3  Worry too much - different things 3 3  Trouble relaxing 3 3  Restless 3 2  Easily annoyed or irritable 2 3  Afraid - awful might happen 3 3  Total GAD 7 Score 20 20  Anxiety Difficulty Very difficult Somewhat difficult    Follow-up  Patient presents today for pneumonia follow-up. Patient states that he pneumonia is not improving and it seems like her symptoms are getting worse. She reports that she had to call out of work on yesterday,04/23/2019. She had COVID pneumonia diagnosed on 04/04/2019. She was treated with azithromycin and albuterol inhaler. She has some SOB with activity. She feels run down. She denies fever. She reports difficulty sleeping.   Allergies  Allergen Reactions  . Amoxicillin   . Penicillins      Current Outpatient Medications:  .  albuterol (VENTOLIN HFA) 108 (90 Base) MCG/ACT inhaler, Inhale 2 puffs into the lungs every 6 (six) hours as needed for wheezing or shortness of breath., Disp: 6.7 g, Rfl: 0 .  busPIRone (BUSPAR) 7.5 MG tablet, Take 1 tablet (7.5 mg total) by mouth 2 (two) times daily., Disp: 180 tablet, Rfl: 0 .  escitalopram (LEXAPRO) 20 MG tablet, TAKE 1 TABLET BY MOUTH EVERY DAY, Disp: 90 tablet, Rfl: 3 .  hydrOXYzine (ATARAX/VISTARIL) 10 MG tablet, TAKE 1 TABLET BY MOUTH EVERY DAY AS NEEDED, Disp: 90 tablet, Rfl: 0 .  levonorgestrel (MIRENA) 20 MCG/24HR IUD, 1 each by Intrauterine route once., Disp: , Rfl:  .  budesonide-formoterol (SYMBICORT) 160-4.5 MCG/ACT inhaler, Inhale 2 puffs into the lungs 2 (two) times daily., Disp: 1 Inhaler, Rfl: 3 .  losartan-hydrochlorothiazide (HYZAAR) 50-12.5 MG tablet, Take 1 tablet by mouth daily., Disp: 90 tablet, Rfl: 1 .  traZODone (DESYREL) 50 MG tablet, Take 0.5-1 tablets (25-50 mg total) by mouth at bedtime as needed for sleep., Disp: 30 tablet, Rfl:  3  Review of Systems  Social History   Tobacco Use  . Smoking status: Never Smoker  . Smokeless tobacco: Never Used  Substance Use Topics  . Alcohol use: Yes    Alcohol/week: 6.0 standard drinks    Types: 2 Glasses of wine, 4 Cans of beer per week      Objective:   There were no vitals taken for this visit. There were no vitals filed for this visit.There is no height or weight on file to calculate BMI.   Physical Exam   No results found for any visits on 04/24/19.     Assessment & Plan    1. Pneumonia due to COVID-19 virus  Will get CXR as it has been ~ 1 month. Counseled that it may look the same but it should not look worse. Counseled the recovery time for COVID and pneumonia can be long and slow.   - DG Chest 2 View; Future - budesonide-formoterol (SYMBICORT) 160-4.5 MCG/ACT inhaler; Inhale 2 puffs into the lungs 2 (two) times daily.  Dispense: 1 Inhaler; Refill: 3  2. Reactive airway disease without complication, unspecified asthma severity, unspecified whether persistent  - budesonide-formoterol (SYMBICORT) 160-4.5 MCG/ACT inhaler; Inhale 2 puffs into the lungs 2 (two) times daily.  Dispense: 1 Inhaler; Refill: 3  3. Essential hypertension  Change from amlodipine 10 mg due to intolerable pedal edema. F/u 1 month in office with cmet.  - losartan-hydrochlorothiazide (HYZAAR) 50-12.5 MG tablet; Take 1 tablet by mouth daily.  Dispense: 90 tablet; Refill: 1  4. Insomnia, unspecified type  - traZODone (DESYREL) 50 MG tablet; Take 0.5-1 tablets (25-50 mg total) by mouth at bedtime as needed for sleep.  Dispense: 30 tablet; Refill: 3 discussed the assessment and treatment plan with the patient. The patient was provided an opportunity to ask questions and all were answered. The patient agreed with the plan and demonstrated an understanding of the instructions.   The patient was advised to call back or seek an in-person evaluation if the symptoms worsen or if the condition  fails to improve as anticipated.  I provided 25 minutes of non-face-to-face time during this encounter.  The entirety of the information documented in the History of Present Illness, Review of Systems and Physical Exam were personally obtained by me. Portions of this information were initially documented by [facility] and reviewed by me for thoroughness and accuracy.       Trey Sailors, PA-C  [facility]

## 2019-04-24 NOTE — Patient Instructions (Signed)
COVID-19 COVID-19 is a respiratory infection that is caused by a virus called severe acute respiratory syndrome coronavirus 2 (SARS-CoV-2). The disease is also known as coronavirus disease or novel coronavirus. In some people, the virus may not cause any symptoms. In others, it may cause a serious infection. The infection can get worse quickly and can lead to complications, such as:  Pneumonia, or infection of the lungs.  Acute respiratory distress syndrome or ARDS. This is a condition in which fluid build-up in the lungs prevents the lungs from filling with air and passing oxygen into the blood.  Acute respiratory failure. This is a condition in which there is not enough oxygen passing from the lungs to the body or when carbon dioxide is not passing from the lungs out of the body.  Sepsis or septic shock. This is a serious bodily reaction to an infection.  Blood clotting problems.  Secondary infections due to bacteria or fungus.  Organ failure. This is when your body's organs stop working. The virus that causes COVID-19 is contagious. This means that it can spread from person to person through droplets from coughs and sneezes (respiratory secretions). What are the causes? This illness is caused by a virus. You may catch the virus by:  Breathing in droplets from an infected person. Droplets can be spread by a person breathing, speaking, singing, coughing, or sneezing.  Touching something, like a table or a doorknob, that was exposed to the virus (contaminated) and then touching your mouth, nose, or eyes. What increases the risk? Risk for infection You are more likely to be infected with this virus if you:  Are within 6 feet (2 meters) of a person with COVID-19.  Provide care for or live with a person who is infected with COVID-19.  Spend time in crowded indoor spaces or live in shared housing. Risk for serious illness You are more likely to become seriously ill from the virus if you:   Are 50 years of age or older. The higher your age, the more you are at risk for serious illness.  Live in a nursing home or long-term care facility.  Have cancer.  Have a long-term (chronic) disease such as: ? Chronic lung disease, including chronic obstructive pulmonary disease or asthma. ? A long-term disease that lowers your body's ability to fight infection (immunocompromised). ? Heart disease, including heart failure, a condition in which the arteries that lead to the heart become narrow or blocked (coronary artery disease), a disease which makes the heart muscle thick, weak, or stiff (cardiomyopathy). ? Diabetes. ? Chronic kidney disease. ? Sickle cell disease, a condition in which red blood cells have an abnormal "sickle" shape. ? Liver disease.  Are obese. What are the signs or symptoms? Symptoms of this condition can range from mild to severe. Symptoms may appear any time from 2 to 14 days after being exposed to the virus. They include:  A fever or chills.  A cough.  Difficulty breathing.  Headaches, body aches, or muscle aches.  Runny or stuffy (congested) nose.  A sore throat.  New loss of taste or smell. Some people may also have stomach problems, such as nausea, vomiting, or diarrhea. Other people may not have any symptoms of COVID-19. How is this diagnosed? This condition may be diagnosed based on:  Your signs and symptoms, especially if: ? You live in an area with a COVID-19 outbreak. ? You recently traveled to or from an area where the virus is common. ? You   provide care for or live with a person who was diagnosed with COVID-19. ? You were exposed to a person who was diagnosed with COVID-19.  A physical exam.  Lab tests, which may include: ? Taking a sample of fluid from the back of your nose and throat (nasopharyngeal fluid), your nose, or your throat using a swab. ? A sample of mucus from your lungs (sputum). ? Blood tests.  Imaging tests, which  may include, X-rays, CT scan, or ultrasound. How is this treated? At present, there is no medicine to treat COVID-19. Medicines that treat other diseases are being used on a trial basis to see if they are effective against COVID-19. Your health care provider will talk with you about ways to treat your symptoms. For most people, the infection is mild and can be managed at home with rest, fluids, and over-the-counter medicines. Treatment for a serious infection usually takes places in a hospital intensive care unit (ICU). It may include one or more of the following treatments. These treatments are given until your symptoms improve.  Receiving fluids and medicines through an IV.  Supplemental oxygen. Extra oxygen is given through a tube in the nose, a face mask, or a hood.  Positioning you to lie on your stomach (prone position). This makes it easier for oxygen to get into the lungs.  Continuous positive airway pressure (CPAP) or bi-level positive airway pressure (BPAP) machine. This treatment uses mild air pressure to keep the airways open. A tube that is connected to a motor delivers oxygen to the body.  Ventilator. This treatment moves air into and out of the lungs by using a tube that is placed in your windpipe.  Tracheostomy. This is a procedure to create a hole in the neck so that a breathing tube can be inserted.  Extracorporeal membrane oxygenation (ECMO). This procedure gives the lungs a chance to recover by taking over the functions of the heart and lungs. It supplies oxygen to the body and removes carbon dioxide. Follow these instructions at home: Lifestyle  If you are sick, stay home except to get medical care. Your health care provider will tell you how long to stay home. Call your health care provider before you go for medical care.  Rest at home as told by your health care provider.  Do not use any products that contain nicotine or tobacco, such as cigarettes, e-cigarettes, and  chewing tobacco. If you need help quitting, ask your health care provider.  Return to your normal activities as told by your health care provider. Ask your health care provider what activities are safe for you. General instructions  Take over-the-counter and prescription medicines only as told by your health care provider.  Drink enough fluid to keep your urine pale yellow.  Keep all follow-up visits as told by your health care provider. This is important. How is this prevented?  There is no vaccine to help prevent COVID-19 infection. However, there are steps you can take to protect yourself and others from this virus. To protect yourself:   Do not travel to areas where COVID-19 is a risk. The areas where COVID-19 is reported change often. To identify high-risk areas and travel restrictions, check the CDC travel website: wwwnc.cdc.gov/travel/notices  If you live in, or must travel to, an area where COVID-19 is a risk, take precautions to avoid infection. ? Stay away from people who are sick. ? Wash your hands often with soap and water for 20 seconds. If soap and water   are not available, use an alcohol-based hand sanitizer. ? Avoid touching your mouth, face, eyes, or nose. ? Avoid going out in public, follow guidance from your state and local health authorities. ? If you must go out in public, wear a cloth face covering or face mask. Make sure your mask covers your nose and mouth. ? Avoid crowded indoor spaces. Stay at least 6 feet (2 meters) away from others. ? Disinfect objects and surfaces that are frequently touched every day. This may include:  Counters and tables.  Doorknobs and light switches.  Sinks and faucets.  Electronics, such as phones, remote controls, keyboards, computers, and tablets. To protect others: If you have symptoms of COVID-19, take steps to prevent the virus from spreading to others.  If you think you have a COVID-19 infection, contact your health care  provider right away. Tell your health care team that you think you may have a COVID-19 infection.  Stay home. Leave your house only to seek medical care. Do not use public transport.  Do not travel while you are sick.  Wash your hands often with soap and water for 20 seconds. If soap and water are not available, use alcohol-based hand sanitizer.  Stay away from other members of your household. Let healthy household members care for children and pets, if possible. If you have to care for children or pets, wash your hands often and wear a mask. If possible, stay in your own room, separate from others. Use a different bathroom.  Make sure that all people in your household wash their hands well and often.  Cough or sneeze into a tissue or your sleeve or elbow. Do not cough or sneeze into your hand or into the air.  Wear a cloth face covering or face mask. Make sure your mask covers your nose and mouth. Where to find more information  Centers for Disease Control and Prevention: www.cdc.gov/coronavirus/2019-ncov/index.html  World Health Organization: www.who.int/health-topics/coronavirus Contact a health care provider if:  You live in or have traveled to an area where COVID-19 is a risk and you have symptoms of the infection.  You have had contact with someone who has COVID-19 and you have symptoms of the infection. Get help right away if:  You have trouble breathing.  You have pain or pressure in your chest.  You have confusion.  You have bluish lips and fingernails.  You have difficulty waking from sleep.  You have symptoms that get worse. These symptoms may represent a serious problem that is an emergency. Do not wait to see if the symptoms will go away. Get medical help right away. Call your local emergency services (911 in the [place] Do not drive yourself to the hospital. Let the emergency medical personnel know if you think you have COVID-19. Summary  COVID-19 is a  respiratory infection that is caused by a virus. It is also known as coronavirus disease or novel coronavirus. It can cause serious infections, such as pneumonia, acute respiratory distress syndrome, acute respiratory failure, or sepsis.  The virus that causes COVID-19 is contagious. This means that it can spread from person to person through droplets from breathing, speaking, singing, coughing, or sneezing.  You are more likely to develop a serious illness if you are 50 years of age or older, have a weak immune system, live in a nursing home, or have chronic disease.  There is no medicine to treat COVID-19. Your health care provider will talk with you about ways to treat your symptoms.    Take steps to protect yourself and others from infection. Wash your hands often and disinfect objects and surfaces that are frequently touched every day. Stay away from people who are sick and wear a mask if you are sick. This information is not intended to replace advice given to you by your health care provider. Make sure you discuss any questions you have with your health care provider. Document Revised: 12/08/2018 Document Reviewed: 03/16/2018 Elsevier Patient Education  2020 Elsevier Inc.  

## 2019-04-28 ENCOUNTER — Other Ambulatory Visit: Payer: Self-pay | Admitting: Physician Assistant

## 2019-04-28 DIAGNOSIS — F419 Anxiety disorder, unspecified: Secondary | ICD-10-CM

## 2019-05-02 ENCOUNTER — Other Ambulatory Visit: Payer: Self-pay | Admitting: Physician Assistant

## 2019-05-02 DIAGNOSIS — R05 Cough: Secondary | ICD-10-CM

## 2019-05-02 DIAGNOSIS — R059 Cough, unspecified: Secondary | ICD-10-CM

## 2019-05-03 ENCOUNTER — Encounter: Payer: Self-pay | Admitting: Physician Assistant

## 2019-05-09 ENCOUNTER — Ambulatory Visit: Payer: Self-pay | Admitting: *Deleted

## 2019-05-09 ENCOUNTER — Telehealth: Payer: Self-pay | Admitting: *Deleted

## 2019-05-09 NOTE — Chronic Care Management (AMB) (Signed)
     Care Management   Unsuccessful Call Note 05/09/2019 Name: Alexandra Fleming MRN: [id number] DOB: March 31, 1990  Patient  is a 29 year old female who sees Osvaldo Angst, [place] for primary care. Osvaldo Angst, PA-C asked the CCM team to consult the patient for Mental Health Counseling and Resources.     This social worker was unable to reach patient via telephone today for follow up call. I have left HIPAA compliant voicemail asking patient to return my call. (unsuccessful outreach #1).   Plan: Will follow-up within 7 business days via telephone.      Verna Czech, LCSW Clinical Social Worker  [facility] [phone]

## 2019-05-16 ENCOUNTER — Telehealth: Payer: Self-pay | Admitting: *Deleted

## 2019-05-16 ENCOUNTER — Ambulatory Visit: Payer: Self-pay | Admitting: *Deleted

## 2019-05-16 NOTE — Chronic Care Management (AMB) (Signed)
    Care Management   Unsuccessful Call Note 05/16/2019 Name: Alexandra Fleming MRN: [id number] DOB: [birth year]  Patient is a 29 year old female who sees Osvaldo Angst, [place] for primary care. Osvaldo Angst PA-C asked the CCM team to consult the patient for Mental Health Counseling and Resources.    This social worker was unable to reach patient via telephone today for follow up call. I have left HIPAA compliant voicemail asking patient to return my call. (unsuccessful outreach #2).   Plan: Will follow-up within 7-14  business days via telephone.      Verna Czech, LCSW Clinical Social Worker  [facility] [phone]

## 2019-05-28 ENCOUNTER — Ambulatory Visit: Payer: Self-pay | Admitting: *Deleted

## 2019-05-28 ENCOUNTER — Telehealth: Payer: Self-pay | Admitting: *Deleted

## 2019-05-28 NOTE — Chronic Care Management (AMB) (Signed)
    Care Management   Unsuccessful Call Note 05/28/2019 Name: Alexandra Fleming MRN: [id number] DOB: 12-05-1990  Patient is a 29 year old female who sees Osvaldo Angst, [place] for primary care. Osvaldo Angst, PA-C asked the CCM team to consult the patient for Mental Health Counseling and Resources.    This social worker was unable to reach patient via telephone today for follow up call. I have left HIPAA compliant voicemail asking patient to return my call. (unsuccessful outreach #3).   Plan: This Child psychotherapist will not make any additional outreach calls to patient, however will be happy to engage patient upon her return call.     Verna Czech, LCSW Clinical Social Worker  [facility] [phone]

## 2019-07-16 ENCOUNTER — Telehealth: Payer: Self-pay

## 2019-07-16 ENCOUNTER — Encounter: Payer: Self-pay | Admitting: Physician Assistant

## 2019-07-16 DIAGNOSIS — F419 Anxiety disorder, unspecified: Secondary | ICD-10-CM

## 2019-07-16 NOTE — Telephone Encounter (Signed)
Patient send a message via Mychart for a refill on Buspar. Last visit was on 03/12/2019 and patient was started on Buspar, please advise.

## 2019-07-17 MED ORDER — BUSPIRONE HCL 7.5 MG PO TABS: 7.5000 mg | ORAL_TABLET | Freq: Two times a day (BID) | ORAL | 0 refills | Status: DC

## 2019-07-17 NOTE — Telephone Encounter (Signed)
30 days given. She needs follow up scheduled for anxiety and HTN either video or in person please before more refills. Thank you.

## 2019-07-17 NOTE — Telephone Encounter (Signed)
Called patient to advise her she will need a f/u appointment and message left by [facility], if patient calls back okay for PEC to schedule or advise patient of message.

## 2019-07-18 NOTE — Telephone Encounter (Signed)
Left VM to return call to schedule a follow-up visit from January '21.

## 2019-07-18 NOTE — Telephone Encounter (Signed)
Patient called back and scheduled a f/u appointment for tomorrow, 07/19/2019.

## 2019-07-19 ENCOUNTER — Encounter: Payer: Self-pay | Admitting: Physician Assistant

## 2019-07-19 ENCOUNTER — Other Ambulatory Visit: Payer: Self-pay

## 2019-07-19 ENCOUNTER — Ambulatory Visit: Payer: BC Managed Care – PPO | Admitting: Physician Assistant

## 2019-07-19 VITALS — BP 133/86 | HR 92 | Temp 96.9°F | Wt 266.4 lb

## 2019-07-19 DIAGNOSIS — I10 Essential (primary) hypertension: Secondary | ICD-10-CM

## 2019-07-19 DIAGNOSIS — U071 COVID-19: Secondary | ICD-10-CM | POA: Diagnosis not present

## 2019-07-19 DIAGNOSIS — F419 Anxiety disorder, unspecified: Secondary | ICD-10-CM | POA: Diagnosis not present

## 2019-07-19 DIAGNOSIS — J1282 Pneumonia due to coronavirus disease 2019: Secondary | ICD-10-CM | POA: Diagnosis not present

## 2019-07-19 MED ORDER — BUSPIRONE HCL 7.5 MG PO TABS: 7.5000 mg | ORAL_TABLET | Freq: Two times a day (BID) | ORAL | 1 refills | Status: DC

## 2019-07-19 NOTE — Progress Notes (Signed)
Established patient visit   Patient: Alexandra Fleming   DOB: April 13, 1990   29 y.o. Female  MRN: [id number] Visit Date: 07/19/2019  Today's healthcare provider: Trinna Post, PA-C   Chief Complaint  Patient presents with  . Anxiety   I,Latasha Walston,acting as a scribe for Trinna Post, PA-C.,have documented all relevant documentation on the behalf of Trinna Post, PA-C,as directed by  Trinna Post, PA-C while in the presence of Trinna Post, PA-C.  Subjective    HPI Anxiety, Follow-up  She was last seen for anxiety 4 months ago. Changes made at last visit include started Buspar in addition to Lexapro.   She reports good compliance with treatment. She reports good tolerance of treatment. She is not having side effects.   She feels her anxiety is severe and Unchanged since last visit.  Symptoms: No chest pain Yes difficulty concentrating  No dizziness Yes fatigue  Yes feelings of losing control Yes insomnia  Yes irritable No palpitations  Yes panic attacks Yes racing thoughts  Yes shortness of breath Yes sweating  Yes tremors/shakes    GAD-7 Results GAD-7 Generalized Anxiety Disorder Screening Tool 07/19/2019 03/12/2019 01/18/2018  1. Feeling Nervous, Anxious, or on Edge 3 3 3   2. Not Being Able to Stop or Control Worrying 3 3 3   3. Worrying Too Much About Different Things 3 3 3   4. Trouble Relaxing 3 3 3   5. Being So Restless it's Hard To Sit Still 3 3 2   6. Becoming Easily Annoyed or Irritable 3 2 3   7. Feeling Afraid As If Something Awful Might Happen 3 3 3   Total GAD-7 Score 21 20 20   Difficulty At Work, Home, or Getting  Along With Others? Very difficult Very difficult Somewhat difficult    PHQ-9 Scores PHQ9 SCORE ONLY 07/19/2019 04/03/2019 04/18/2018  PHQ-9 Total Score 18 7 2     --------------------------------------------------------------------------------------------------- Depression screen [facility] 2/9 07/19/2019 04/03/2019  04/18/2018  Decreased Interest 2 1 0  Down, Depressed, Hopeless 3 1 0  PHQ - 2 Score 5 2 0  Altered sleeping 2 1 1   Tired, decreased energy 2 1 0  Change in appetite 3 1 0  Feeling bad or failure about yourself  3 1 1   Trouble concentrating 2 1 0  Moving slowly or fidgety/restless 0 0 0  Suicidal thoughts 1 0 0  PHQ-9 Score 18 7 2   Difficult doing work/chores Somewhat difficult - Not difficult at all   Hypertension, follow-up  BP Readings from Last 3 Encounters:  07/19/19 133/86  03/12/19 (!) 160/122  04/18/18 (!) 148/108   Wt Readings from Last 3 Encounters:  07/19/19 266 lb 6.4 oz (120.8 kg)  04/18/18 237 lb 9.6 oz (107.8 kg)  01/18/18 221 lb 12.8 oz (100.6 kg)     She was last seen for hypertension 5 months ago.  BP at that visit was elevated. Management since that visit includes switching from amlodipine to losartan-HCTZ due to lower extremity swelling..  She reports excellent compliance with treatment. She is not having side effects.  She is following a Regular diet. She is not exercising. She does not smoke.  Use of agents associated with hypertension: none.   Outside blood pressures are normal. Symptoms: No chest pain No chest pressure  No palpitations No syncope  No dyspnea No orthopnea  No paroxysmal nocturnal dyspnea No lower extremity edema   Pertinent labs: Lab Results  Component Value Date   CHOL 172 04/18/2018  HDL 58 04/18/2018   LDLCALC 99 04/18/2018   TRIG 75 04/18/2018   CHOLHDL 2.3 03/18/2017   Lab Results  Component Value Date   NA 136 04/18/2018   K 4.3 04/18/2018   CREATININE 0.75 04/18/2018   GFRNONAA 108 04/18/2018   GFRAA 125 04/18/2018   GLUCOSE 84 04/18/2018     The ASCVD Risk score Denman George DC Jr., et al., 2013) failed to calculate for the following reasons:   The 2013 ASCVD risk score is only valid for ages 39 to 52   ---------------------------------------------------------------------------------------------------       Medications: Outpatient Medications Prior to Visit  Medication Sig  . escitalopram (LEXAPRO) 20 MG tablet TAKE 1 TABLET(20 MG) BY MOUTH DAILY  . hydrOXYzine (ATARAX/VISTARIL) 10 MG tablet TAKE 1 TABLET BY MOUTH EVERY DAY AS NEEDED  . levonorgestrel (MIRENA) 20 MCG/24HR IUD 1 each by Intrauterine route once.  Marland Kitchen losartan-hydrochlorothiazide (HYZAAR) 50-12.5 MG tablet Take 1 tablet by mouth daily.  . traZODone (DESYREL) 50 MG tablet Take 0.5-1 tablets (25-50 mg total) by mouth at bedtime as needed for sleep.  . [DISCONTINUED] busPIRone (BUSPAR) 7.5 MG tablet Take 1 tablet (7.5 mg total) by mouth 2 (two) times daily.  Marland Kitchen albuterol (VENTOLIN HFA) 108 (90 Base) MCG/ACT inhaler INHALE 2 PUFFS INTO THE LUNGS EVERY 6 HOURS AS NEEDED FOR WHEEZING OR SHORTNESS OF BREATH (Patient not taking: Reported on 07/19/2019)  . budesonide-formoterol (SYMBICORT) 160-4.5 MCG/ACT inhaler Inhale 2 puffs into the lungs 2 (two) times daily. (Patient not taking: Reported on 07/19/2019)   No facility-administered medications prior to visit.    Review of Systems  Constitutional: Positive for fatigue.  Respiratory: Negative.   Cardiovascular: Negative.   Musculoskeletal: Negative.   Psychiatric/Behavioral: Positive for decreased concentration and dysphoric mood. The patient is nervous/anxious.       Objective    BP 133/86 (BP Location: Right Arm, Patient Position: Sitting, Cuff Size: Large)   Pulse 92   Temp (!) 96.9 F (36.1 C) (Temporal)   Wt 266 lb 6.4 oz (120.8 kg)   BMI 44.33 kg/m    Physical Exam Constitutional:      Appearance: Normal appearance.  Cardiovascular:     Rate and Rhythm: Normal rate and regular rhythm.     Pulses: Normal pulses.     Heart sounds: Normal heart sounds.  Pulmonary:     Effort: Pulmonary effort is normal.     Breath sounds: Normal breath sounds.  Skin:    General: Skin is warm and dry.  Neurological:     Mental Status: She is alert and oriented to person, place, and  time. Mental status is at baseline.  Psychiatric:        Mood and Affect: Mood normal.        Behavior: Behavior normal.     No results found for any visits on 07/19/19.  Assessment & Plan    1. Anxiety Stable. No changes. Continue medications at same dose. - busPIRone (BUSPAR) 7.5 MG tablet; Take 1 tablet (7.5 mg total) by mouth 2 (two) times daily.  Dispense: 180 tablet; Refill: 1  2. Essential hypertension Well controlled Continue current medications Recheck metabolic panel F/u in 6 months  - Comprehensive Metabolic Panel (CMET)  3. Pneumonia due to COVID-19 virus Ok to stop Symbicort and albuterol    Return in about 6 months (around 01/19/2020) for CPE.      ITrey Sailors, PA-C, have reviewed all documentation for this visit. The documentation on 07/19/19  for the exam, diagnosis, procedures, and orders are all accurate and complete.    Paulene Floor  [facility] [phone] (phone) [phone] (fax)  [place]

## 2019-07-20 LAB — COMPREHENSIVE METABOLIC PANEL
ALT: 12 IU/L (ref 0–32)
AST: 15 IU/L (ref 0–40)
Albumin/Globulin Ratio: 1.5 (ref 1.2–2.2)
Albumin: 4.3 g/dL (ref 3.9–5.0)
Alkaline Phosphatase: 87 IU/L (ref 48–121)
BUN/Creatinine Ratio: 19 (ref 9–23)
BUN: 16 mg/dL (ref 6–20)
Bilirubin Total: 0.4 mg/dL (ref 0.0–1.2)
CO2: 20 mmol/L (ref 20–29)
Calcium: 9.6 mg/dL (ref 8.7–10.2)
Chloride: 101 mmol/L (ref 96–106)
Creatinine, Ser: 0.86 mg/dL (ref 0.57–1.00)
GFR calc Af Amer: 105 mL/min/{1.73_m2} (ref >59)
GFR calc non Af Amer: 91 mL/min/{1.73_m2} (ref >59)
Globulin, Total: 2.9 g/dL (ref 1.5–4.5)
Glucose: 90 mg/dL (ref 65–99)
Potassium: 4.3 mmol/L (ref 3.5–5.2)
Sodium: 136 mmol/L (ref 134–144)
Total Protein: 7.2 g/dL (ref 6.0–8.5)

## 2019-08-22 ENCOUNTER — Other Ambulatory Visit: Payer: Self-pay | Admitting: Physician Assistant

## 2019-08-22 DIAGNOSIS — F419 Anxiety disorder, unspecified: Secondary | ICD-10-CM

## 2019-08-22 NOTE — Telephone Encounter (Signed)
[facility] called and spoke to Alexandra Fleming, Pharmacologist about the refill request. Advised refill was sent on 07/19/19 #180/1 refill. She says that refill is in the system and on hold, she released it to be refilled and cancelled the automatic request for refill.

## 2019-11-14 ENCOUNTER — Other Ambulatory Visit: Payer: Self-pay | Admitting: Physician Assistant

## 2019-11-14 DIAGNOSIS — I10 Essential (primary) hypertension: Secondary | ICD-10-CM

## 2019-11-14 NOTE — Telephone Encounter (Signed)
Requested Prescriptions  Pending Prescriptions Disp Refills  . losartan-hydrochlorothiazide (HYZAAR) 50-12.5 MG tablet [Pharmacy Med Name: LOSARTAN/HCTZ 50/12.5MG  TABLETS] 90 tablet 0    Sig: TAKE 1 TABLET BY MOUTH DAILY     Cardiovascular: ARB + Diuretic Combos Passed - 11/14/2019  3:18 AM      Passed - K in normal range and within 180 days    Potassium  Date Value Ref Range Status  07/19/2019 4.3 3.5 - 5.2 mmol/L Final         Passed - Na in normal range and within 180 days    Sodium  Date Value Ref Range Status  07/19/2019 136 134 - 144 mmol/L Final         Passed - Cr in normal range and within 180 days    Creatinine, Ser  Date Value Ref Range Status  07/19/2019 0.86 0.57 - 1.00 mg/dL Final         Passed - Ca in normal range and within 180 days    Calcium  Date Value Ref Range Status  07/19/2019 9.6 8.7 - 10.2 mg/dL Final         Passed - Patient is not pregnant      Passed - Last BP in normal range    BP Readings from Last 1 Encounters:  07/19/19 133/86         Passed - Valid encounter within last 6 months    Recent Outpatient Visits          3 months ago Anxiety   [facility] Osvaldo Angst M, PA-C   6 months ago Pneumonia due to COVID-19 virus   [facility] [place], [place], PA-C   7 months ago Pneumonia due to COVID-19 virus   [facility] [place], [place], PA-C   7 months ago Suspected COVID-19 virus infection   [facility] [place], Marzella Schlein, [place]   8 months ago Anxiety   [facility] Trey Sailors, [place]      Future Appointments            In 2 months Trey Sailors, PA-C [employer]

## 2019-11-15 ENCOUNTER — Other Ambulatory Visit: Payer: Self-pay | Admitting: Physician Assistant

## 2019-11-15 DIAGNOSIS — F419 Anxiety disorder, unspecified: Secondary | ICD-10-CM

## 2019-11-17 ENCOUNTER — Inpatient Hospital Stay
Admission: EM | Admit: 2019-11-17 | Discharge: 2019-11-21 | DRG: 175 | Disposition: A | Discharge status: Home / Self Care | Payer: Self-pay | Attending: Family Medicine | Admitting: Family Medicine

## 2019-11-17 ENCOUNTER — Emergency Department: Payer: Self-pay

## 2019-11-17 ENCOUNTER — Other Ambulatory Visit: Payer: Self-pay

## 2019-11-17 ENCOUNTER — Observation Stay: Payer: Self-pay

## 2019-11-17 DIAGNOSIS — Z6841 Body Mass Index (BMI) 40.0 and over, adult: Secondary | ICD-10-CM

## 2019-11-17 DIAGNOSIS — I2694 Multiple subsegmental pulmonary emboli without acute cor pulmonale: Principal | ICD-10-CM | POA: Diagnosis present

## 2019-11-17 DIAGNOSIS — J9601 Acute respiratory failure with hypoxia: Secondary | ICD-10-CM | POA: Diagnosis present

## 2019-11-17 DIAGNOSIS — Z975 Presence of (intrauterine) contraceptive device: Secondary | ICD-10-CM

## 2019-11-17 DIAGNOSIS — I2699 Other pulmonary embolism without acute cor pulmonale: Secondary | ICD-10-CM | POA: Diagnosis present

## 2019-11-17 DIAGNOSIS — Z79899 Other long term (current) drug therapy: Secondary | ICD-10-CM

## 2019-11-17 DIAGNOSIS — N920 Excessive and frequent menstruation with regular cycle: Secondary | ICD-10-CM | POA: Diagnosis present

## 2019-11-17 DIAGNOSIS — D509 Iron deficiency anemia, unspecified: Secondary | ICD-10-CM | POA: Diagnosis present

## 2019-11-17 DIAGNOSIS — F329 Major depressive disorder, single episode, unspecified: Secondary | ICD-10-CM | POA: Diagnosis present

## 2019-11-17 DIAGNOSIS — Z8249 Family history of ischemic heart disease and other diseases of the circulatory system: Secondary | ICD-10-CM

## 2019-11-17 DIAGNOSIS — Z20822 Contact with and (suspected) exposure to covid-19: Secondary | ICD-10-CM | POA: Diagnosis present

## 2019-11-17 DIAGNOSIS — Z880 Allergy status to penicillin: Secondary | ICD-10-CM

## 2019-11-17 DIAGNOSIS — Z881 Allergy status to other antibiotic agents status: Secondary | ICD-10-CM

## 2019-11-17 DIAGNOSIS — J189 Pneumonia, unspecified organism: Secondary | ICD-10-CM | POA: Diagnosis present

## 2019-11-17 DIAGNOSIS — F418 Other specified anxiety disorders: Secondary | ICD-10-CM | POA: Diagnosis present

## 2019-11-17 DIAGNOSIS — Z8616 Personal history of COVID-19: Secondary | ICD-10-CM

## 2019-11-17 DIAGNOSIS — D62 Acute posthemorrhagic anemia: Secondary | ICD-10-CM | POA: Diagnosis present

## 2019-11-17 DIAGNOSIS — G4730 Sleep apnea, unspecified: Secondary | ICD-10-CM | POA: Diagnosis present

## 2019-11-17 DIAGNOSIS — A419 Sepsis, unspecified organism: Secondary | ICD-10-CM | POA: Insufficient documentation

## 2019-11-17 DIAGNOSIS — R079 Chest pain, unspecified: Secondary | ICD-10-CM

## 2019-11-17 DIAGNOSIS — I10 Essential (primary) hypertension: Secondary | ICD-10-CM | POA: Diagnosis present

## 2019-11-17 DIAGNOSIS — F419 Anxiety disorder, unspecified: Secondary | ICD-10-CM | POA: Diagnosis present

## 2019-11-17 DIAGNOSIS — D6862 Lupus anticoagulant syndrome: Secondary | ICD-10-CM | POA: Diagnosis present

## 2019-11-17 HISTORY — DX: Essential (primary) hypertension: I10

## 2019-11-17 HISTORY — DX: COVID-19: U07.1

## 2019-11-17 HISTORY — DX: Depression, unspecified: F32.A

## 2019-11-17 LAB — EXPECTORATED SPUTUM ASSESSMENT W GRAM STAIN, RFLX TO RESP C

## 2019-11-17 LAB — PROTIME-INR
INR: 1.2 (ref 0.8–1.2)
Prothrombin Time: 14.5 seconds (ref 11.4–15.2)

## 2019-11-17 LAB — PROCALCITONIN: Procalcitonin: 0.11 ng/mL

## 2019-11-17 LAB — BASIC METABOLIC PANEL
Anion gap: 11 (ref 5–15)
BUN: 21 mg/dL — ABNORMAL HIGH (ref 6–20)
CO2: 21 mmol/L — ABNORMAL LOW (ref 22–32)
Calcium: 8.5 mg/dL — ABNORMAL LOW (ref 8.9–10.3)
Chloride: 105 mmol/L (ref 98–111)
Creatinine, Ser: 0.83 mg/dL (ref 0.44–1.00)
GFR calc Af Amer: >60 mL/min (ref >60)
GFR calc non Af Amer: >60 mL/min (ref >60)
Glucose, Bld: 159 mg/dL — ABNORMAL HIGH (ref 70–99)
Potassium: 3.7 mmol/L (ref 3.5–5.1)
Sodium: 137 mmol/L (ref 135–145)

## 2019-11-17 LAB — TROPONIN I (HIGH SENSITIVITY)
Troponin I (High Sensitivity): 5 ng/L (ref <18)
Troponin I (High Sensitivity): 5 ng/L (ref <18)

## 2019-11-17 LAB — CBC
HCT: 27.1 % — ABNORMAL LOW (ref 36.0–46.0)
Hemoglobin: 8.3 g/dL — ABNORMAL LOW (ref 12.0–15.0)
MCH: 22.2 pg — ABNORMAL LOW (ref 26.0–34.0)
MCHC: 30.6 g/dL (ref 30.0–36.0)
MCV: 72.5 fL — ABNORMAL LOW (ref 80.0–100.0)
Platelets: 223 10*3/uL (ref 150–400)
RBC: 3.74 MIL/uL — ABNORMAL LOW (ref 3.87–5.11)
RDW: 14.3 % (ref 11.5–15.5)
WBC: 13.5 10*3/uL — ABNORMAL HIGH (ref 4.0–10.5)
nRBC: 0 % (ref 0.0–0.2)

## 2019-11-17 LAB — RESPIRATORY PANEL BY RT PCR (FLU A&B, COVID)
Influenza A by PCR: NEGATIVE
Influenza B by PCR: NEGATIVE
SARS Coronavirus 2 by RT PCR: NEGATIVE

## 2019-11-17 LAB — FIBRIN DERIVATIVES D-DIMER (ARMC ONLY): Fibrin derivatives D-dimer (ARMC): 2665.74 ng/mL (FEU) — ABNORMAL HIGH (ref 0.00–499.00)

## 2019-11-17 LAB — APTT: aPTT: 45 seconds — ABNORMAL HIGH (ref 24–36)

## 2019-11-17 LAB — HEPARIN LEVEL (UNFRACTIONATED): Heparin Unfractionated: <0.1 IU/mL — ABNORMAL LOW (ref 0.30–0.70)

## 2019-11-17 LAB — LACTIC ACID, PLASMA: Lactic Acid, Venous: 0.8 mmol/L (ref 0.5–1.9)

## 2019-11-17 LAB — PREGNANCY, URINE: Preg Test, Ur: NEGATIVE

## 2019-11-17 LAB — HIV ANTIBODY (ROUTINE TESTING W REFLEX): HIV Screen 4th Generation wRfx: NONREACTIVE

## 2019-11-17 MED ORDER — ESCITALOPRAM OXALATE 10 MG PO TABS
20.0000 mg | ORAL_TABLET | Freq: Every day | ORAL | Status: DC
  Administered 2019-11-18 – 2019-11-21 (×4): 20 mg via ORAL
  Filled 2019-11-17 (×5): qty 2

## 2019-11-17 MED ORDER — SODIUM CHLORIDE 0.9 % IV SOLN
1000.0000 mL | Freq: Once | INTRAVENOUS | Status: AC
  Administered 2019-11-17: 1000 mL via INTRAVENOUS

## 2019-11-17 MED ORDER — ACETAMINOPHEN 325 MG PO TABS
650.0000 mg | ORAL_TABLET | Freq: Four times a day (QID) | ORAL | Status: DC | PRN
  Administered 2019-11-20 – 2019-11-21 (×2): 650 mg via ORAL
  Filled 2019-11-17 (×2): qty 2

## 2019-11-17 MED ORDER — SODIUM CHLORIDE 0.9 % IV SOLN: 1.0000 g | INTRAVENOUS | Status: DC

## 2019-11-17 MED ORDER — MORPHINE SULFATE (PF) 2 MG/ML IV SOLN
2.0000 mg | INTRAVENOUS | Status: DC | PRN
  Administered 2019-11-18: 2 mg via INTRAVENOUS
  Filled 2019-11-17 (×2): qty 1

## 2019-11-17 MED ORDER — LOSARTAN POTASSIUM-HCTZ 50-12.5 MG PO TABS: 1.0000 | ORAL_TABLET | Freq: Every day | ORAL | Status: DC

## 2019-11-17 MED ORDER — LEVOFLOXACIN IN D5W 750 MG/150ML IV SOLN
750.0000 mg | Freq: Once | INTRAVENOUS | Status: DC
  Filled 2019-11-17: qty 150

## 2019-11-17 MED ORDER — OXYCODONE-ACETAMINOPHEN 5-325 MG PO TABS
1.0000 | ORAL_TABLET | ORAL | Status: DC | PRN
  Administered 2019-11-18 (×2): 1 via ORAL
  Filled 2019-11-17 (×2): qty 1

## 2019-11-17 MED ORDER — MORPHINE SULFATE (PF) 4 MG/ML IV SOLN
4.0000 mg | Freq: Once | INTRAVENOUS | Status: AC
  Administered 2019-11-17: 4 mg via INTRAVENOUS
  Filled 2019-11-17: qty 1

## 2019-11-17 MED ORDER — HYDRALAZINE HCL 20 MG/ML IJ SOLN: 5.0000 mg | INTRAMUSCULAR | Status: DC | PRN

## 2019-11-17 MED ORDER — ONDANSETRON HCL 4 MG/2ML IJ SOLN: 4.0000 mg | Freq: Three times a day (TID) | INTRAMUSCULAR | Status: DC | PRN

## 2019-11-17 MED ORDER — HEPARIN (PORCINE) 25000 UT/250ML-% IV SOLN
2700.0000 [IU]/h | INTRAVENOUS | Status: DC
  Administered 2019-11-17: 1500 [IU]/h via INTRAVENOUS
  Administered 2019-11-18: 1800 [IU]/h via INTRAVENOUS
  Administered 2019-11-18: 2350 [IU]/h via INTRAVENOUS
  Administered 2019-11-19: 2500 [IU]/h via INTRAVENOUS
  Administered 2019-11-19: 2700 [IU]/h via INTRAVENOUS
  Filled 2019-11-17 (×6): qty 250

## 2019-11-17 MED ORDER — LOSARTAN POTASSIUM 50 MG PO TABS
50.0000 mg | ORAL_TABLET | Freq: Every day | ORAL | Status: DC
  Administered 2019-11-17 – 2019-11-21 (×5): 50 mg via ORAL
  Filled 2019-11-17 (×5): qty 1

## 2019-11-17 MED ORDER — SODIUM CHLORIDE 0.9 % IV SOLN: INTRAVENOUS | Status: DC

## 2019-11-17 MED ORDER — KETOROLAC TROMETHAMINE 30 MG/ML IJ SOLN
30.0000 mg | Freq: Four times a day (QID) | INTRAMUSCULAR | Status: DC | PRN
  Administered 2019-11-17 – 2019-11-19 (×7): 30 mg via INTRAVENOUS
  Filled 2019-11-17 (×7): qty 1

## 2019-11-17 MED ORDER — SODIUM CHLORIDE 0.9 % IV BOLUS: 1000.0000 mL | Freq: Once | INTRAVENOUS | Status: DC

## 2019-11-17 MED ORDER — KETOROLAC TROMETHAMINE 30 MG/ML IJ SOLN
30.0000 mg | Freq: Once | INTRAMUSCULAR | Status: AC
  Administered 2019-11-17: 30 mg via INTRAVENOUS
  Filled 2019-11-17: qty 1

## 2019-11-17 MED ORDER — ALBUTEROL SULFATE HFA 108 (90 BASE) MCG/ACT IN AERS
2.0000 | INHALATION_SPRAY | RESPIRATORY_TRACT | Status: DC | PRN
  Filled 2019-11-17: qty 6.7

## 2019-11-17 MED ORDER — HYDROMORPHONE HCL 1 MG/ML IJ SOLN
1.0000 mg | Freq: Once | INTRAMUSCULAR | Status: AC
  Administered 2019-11-17: 1 mg via INTRAVENOUS
  Filled 2019-11-17: qty 1

## 2019-11-17 MED ORDER — HYDROCHLOROTHIAZIDE 12.5 MG PO CAPS
12.5000 mg | ORAL_CAPSULE | Freq: Every day | ORAL | Status: DC
  Administered 2019-11-17 – 2019-11-21 (×5): 12.5 mg via ORAL
  Filled 2019-11-17 (×5): qty 1

## 2019-11-17 MED ORDER — IOHEXOL 350 MG/ML SOLN
100.0000 mL | Freq: Once | INTRAVENOUS | Status: AC | PRN
  Administered 2019-11-17: 100 mL via INTRAVENOUS

## 2019-11-17 MED ORDER — SODIUM CHLORIDE 0.9 % IV SOLN
500.0000 mg | INTRAVENOUS | Status: DC
  Administered 2019-11-17 – 2019-11-21 (×6): 500 mg via INTRAVENOUS
  Filled 2019-11-17 (×6): qty 500

## 2019-11-17 MED ORDER — HEPARIN BOLUS VIA INFUSION
5000.0000 [IU] | Freq: Once | INTRAVENOUS | Status: AC
  Administered 2019-11-17: 5000 [IU] via INTRAVENOUS
  Filled 2019-11-17: qty 5000

## 2019-11-17 MED ORDER — DM-GUAIFENESIN ER 30-600 MG PO TB12
1.0000 | ORAL_TABLET | Freq: Two times a day (BID) | ORAL | Status: DC | PRN
  Administered 2019-11-19: 1 via ORAL
  Filled 2019-11-17: qty 1

## 2019-11-17 MED ORDER — HEPARIN BOLUS VIA INFUSION
2500.0000 [IU] | Freq: Once | INTRAVENOUS | Status: AC
  Administered 2019-11-17: 2500 [IU] via INTRAVENOUS
  Filled 2019-11-17: qty 2500

## 2019-11-17 MED ORDER — LACTATED RINGERS IV SOLN: INTRAVENOUS | Status: AC

## 2019-11-17 MED ORDER — ONDANSETRON HCL 4 MG/2ML IJ SOLN
4.0000 mg | Freq: Once | INTRAMUSCULAR | Status: AC
  Administered 2019-11-17: 4 mg via INTRAVENOUS
  Filled 2019-11-17: qty 2

## 2019-11-17 NOTE — ED Notes (Signed)
Pt returned from CT, pain remains 10/10

## 2019-11-17 NOTE — ED Notes (Signed)
Pt declined urine pregnancy test, CT notified

## 2019-11-17 NOTE — ED Notes (Signed)
Lab bedside for blood draw. Heparin and fluids paused at this time per lab request.

## 2019-11-17 NOTE — ED Notes (Signed)
Patient states the pain is coming back. Offered Morphine per her orders and she states that did not work. States the "last one they gave me actually worked." After going through the list of meds given it was decided it must be Toradol that worked. Will message MD and ask for order. Husband remains at bedside.

## 2019-11-17 NOTE — ED Triage Notes (Signed)
Patient c/o SOB, right-sided chest tightness, right shoulder/clavicle pain radiating down arm.

## 2019-11-17 NOTE — H&P (Signed)
History and Physical    Alexandra Fleming XBM:[id number] DOB: 11-Jun-1990 DOA: 11/17/2019  Referring MD/NP/PA:   PCP: Trey Sailors, PA-C   Patient coming from:  The patient is coming from home.  At baseline, pt is independent for most of ADL.        Chief Complaint: Chest pain shortness of breath  HPI: Alexandra Fleming is a 29 y.o. female with medical history significant of hypertension, depression, COVID-19 infection in March, who presents with chest pain or shortness.  Patient states that she has been having chest pain shortness breath for 2 days, which has been progressively worsening.  The chest pain is located in the right side of chest, constant, sharp, pleuritic, 10 out of 10 in severity, radiating to the right shoulder, right clavicle and arm, right flank area.  Patient has cough with some brownish colored mucus production.  Denies fever or chills.  Patient does not have nausea, vomiting, diarrhea, abdominal pain, symptoms of UTI or unilateral weakness.  Denies tenderness in the calf areas. Patient was found to have oxygen desaturation to 90-91% on room air.  Currently 93-100% on 4 L oxygen. Pt has Mirena IUD.  ED Course: pt was found to have WBC 13.5, positive D-dimer 2665, pending COVID-19 PCR, electrolytes renal function okay, temperature normal, blood pressure 151/98, tachycardia with heart rate 130, tachypnea with RR 30.  Lower extremity Doppler negative for DVT.  Patient is placed on MedSurg bed for observation.  CTA of chest showed: 1. Acute pulmonary embolism in posterior intralobar branches of right lower lobe pulmonary artery.  2. Right lower lobe airspace disease, which could be due to pneumonia or pulmonary infarct. Mild infiltrate or atelectasis in posterior left lower lobe.   Review of Systems:   General: no fevers, chills, no body weight gain, has fatigue HEENT: no blurry vision, hearing changes or sore throat Respiratory: has dyspnea, coughing,  no wheezing CV: has chest pain, no palpitations GI: no nausea, vomiting, abdominal pain, diarrhea, constipation GU: no dysuria, burning on urination, increased urinary frequency, hematuria  Ext: no leg edema Neuro: no unilateral weakness, numbness, or tingling, no vision change or hearing loss Skin: no rash, no skin tear. MSK: No muscle spasm, no deformity, no limitation of range of movement in spin Heme: No easy bruising.  Travel history: No recent long distant travel.  Allergy:  Allergies  Allergen Reactions  . Amoxicillin   . Penicillins     Past Medical History:  Diagnosis Date  . COVID-19 virus infection   . Depression   . HTN (hypertension)     Past Surgical History:  Procedure Laterality Date  . WISDOM TOOTH EXTRACTION      Social History:  reports that she has never smoked. She has never used smokeless tobacco. She reports current alcohol use of about 6.0 standard drinks of alcohol per week. She reports that she does not use drugs.  Family History:  Family History  Problem Relation Age of Onset  . Allergies Mother   . Breast cancer Mother 17  . High Cholesterol Father   . Hypertension Father   . Sleep apnea Father   . Diabetes Maternal Grandmother   . Hypertension Maternal Grandmother   . COPD Maternal Grandfather   . Thyroid disease Paternal Grandmother   . Hypertension Paternal Grandmother   . Hyperlipidemia Paternal Grandmother   . Sleep apnea Paternal Grandfather   . Hypertension Paternal Grandfather   . Hyperlipidemia Paternal Grandfather   . Hypertension Paternal Aunt   .  Anxiety disorder Paternal Aunt   . Hypertension Maternal Uncle      Prior to Admission medications   Medication Sig Start Date End Date Taking? Authorizing Provider  escitalopram (LEXAPRO) 20 MG tablet TAKE 1 TABLET(20 MG) BY MOUTH DAILY 11/15/19  Yes Trey Sailors, PA-C  losartan-hydrochlorothiazide (HYZAAR) 50-12.5 MG tablet TAKE 1 TABLET BY MOUTH DAILY 11/14/19  Yes Pollak,  Adriana M, PA-C  busPIRone (BUSPAR) 7.5 MG tablet Take 1 tablet (7.5 mg total) by mouth 2 (two) times daily. 07/19/19   Trey Sailors, PA-C  levonorgestrel (MIRENA) 20 MCG/24HR IUD 1 each by Intrauterine route once.    [provider]    Physical Exam: Vitals:   11/17/19 1000 11/17/19 1015 11/17/19 1027 11/17/19 1045  BP: (!) 151/98     Pulse: (!) 118 (!) 117 (!) 110 (!) 115  Resp: (!) 28 (!) 30 (!) 28 (!) 23  Temp:      TempSrc:      SpO2: 100% 93% 93% 95%  Weight:      Height:       General: Not in acute distress HEENT:       Eyes: PERRL, EOMI, no scleral icterus.       ENT: No discharge from the ears and nose, no pharynx injection, no tonsillar enlargement.        Neck: No JVD, no bruit, no mass felt. Heme: No neck lymph node enlargement. Cardiac: S1/S2, RRR, No murmurs, No gallops or rubs. Respiratory: Has coarse breathing sound on the right side.   GI: Soft, nondistended, nontender, no rebound pain, no organomegaly, BS present. GU: No hematuria Ext: No pitting leg edema bilaterally. 2+DP/PT pulse bilaterally. Musculoskeletal: No joint deformities, No joint redness or warmth, no limitation of ROM in spin. Skin: No rashes.  Neuro: Alert, oriented X3, cranial nerves II-XII grossly intact, moves all extremities normally.  Psych: Patient is not psychotic, no suicidal or hemocidal ideation.  Labs on Admission: I have personally reviewed following labs and imaging studies  CBC: Recent Labs  Lab 11/17/19 0209  WBC 13.5*  HGB 8.3*  HCT 27.1*  MCV 72.5*  PLT 223   Basic Metabolic Panel: Recent Labs  Lab 11/17/19 0209  NA 137  K 3.7  CL 105  CO2 21*  GLUCOSE 159*  BUN 21*  CREATININE 0.83  CALCIUM 8.5*   GFR: Estimated Creatinine Clearance: 130.8 mL/min (by C-G formula based on SCr of 0.83 mg/dL). Liver Function Tests: No results for input(s): AST, ALT, ALKPHOS, BILITOT, PROT, ALBUMIN in the last 168 hours. No results for input(s): LIPASE, AMYLASE  in the last 168 hours. No results for input(s): AMMONIA in the last 168 hours. Coagulation Profile: Recent Labs  Lab 11/17/19 1030  INR 1.2   Cardiac Enzymes: No results for input(s): CKTOTAL, CKMB, CKMBINDEX, TROPONINI in the last 168 hours. BNP (last 3 results) No results for input(s): PROBNP in the last 8760 hours. HbA1C: No results for input(s): HGBA1C in the last 72 hours. CBG: No results for input(s): GLUCAP in the last 168 hours. Lipid Profile: No results for input(s): CHOL, HDL, LDLCALC, TRIG, CHOLHDL, LDLDIRECT in the last 72 hours. Thyroid Function Tests: No results for input(s): TSH, T4TOTAL, FREET4, T3FREE, THYROIDAB in the last 72 hours. Anemia Panel: No results for input(s): VITAMINB12, FOLATE, FERRITIN, TIBC, IRON, RETICCTPCT in the last 72 hours. Urine analysis: No results found for: COLORURINE, APPEARANCEUR, LABSPEC, PHURINE, GLUCOSEU, HGBUR, BILIRUBINUR, KETONESUR, PROTEINUR, UROBILINOGEN, NITRITE, LEUKOCYTESUR Sepsis Labs: @LABRCNTIP (procalcitonin:4,lacticidven:4) ) Recent Results (  from the past 240 hour(s))  Respiratory Panel by RT PCR (Flu A&B, Covid) - Nasopharyngeal Swab     Status: None   Collection Time: 11/17/19  9:04 AM   Specimen: Nasopharyngeal Swab  Result Value Ref Range Status   SARS Coronavirus 2 by RT PCR NEGATIVE NEGATIVE Final    Comment: (NOTE) SARS-CoV-2 target nucleic acids are NOT DETECTED.  The SARS-CoV-2 RNA is generally detectable in upper respiratoy specimens during the acute phase of infection. The lowest concentration of SARS-CoV-2 viral copies this assay can detect is 131 copies/mL. A negative result does not preclude SARS-Cov-2 infection and should not be used as the sole basis for treatment or other patient management decisions. A negative result may occur with  improper specimen collection/handling, submission of specimen other than nasopharyngeal swab, presence of viral mutation(s) within the areas targeted by this assay,  and inadequate number of viral copies (<131 copies/mL). A negative result must be combined with clinical observations, patient history, and epidemiological information. The expected result is Negative.  Fact Sheet for Patients:  https://www.moore.com/  Fact Sheet for Healthcare Providers:  https://www.young.biz/  This test is no t yet approved or cleared by the [place] FDA and  has been authorized for detection and/or diagnosis of SARS-CoV-2 by FDA under an Emergency Use Authorization (EUA). This EUA will remain  in effect (meaning this test can be used) for the duration of the COVID-19 declaration under Section 564(b)(1) of the Act, 21 U.S.C. section 360bbb-3(b)(1), unless the authorization is terminated or revoked sooner.     Influenza A by PCR NEGATIVE NEGATIVE Final   Influenza B by PCR NEGATIVE NEGATIVE Final    Comment: (NOTE) The Xpert Xpress SARS-CoV-2/FLU/RSV assay is intended as an aid in  the diagnosis of influenza from Nasopharyngeal swab specimens and  should not be used as a sole basis for treatment. Nasal washings and  aspirates are unacceptable for Xpert Xpress SARS-CoV-2/FLU/RSV  testing.  Fact Sheet for Patients: https://www.moore.com/  Fact Sheet for Healthcare Providers: https://www.young.biz/  This test is not yet approved or cleared by the [place] FDA and  has been authorized for detection and/or diagnosis of SARS-CoV-2 by  FDA under an Emergency Use Authorization (EUA). This EUA will remain  in effect (meaning this test can be used) for the duration of the  Covid-19 declaration under Section 564(b)(1) of the Act, 21  U.S.C. section 360bbb-3(b)(1), unless the authorization is  terminated or revoked. Performed at [facility], [address] [place], [place] [address]   Culture, sputum-assessment     Status: None   Collection Time: 11/17/19 11:04 AM    Specimen: Sputum  Result Value Ref Range Status   Specimen Description SPUTUM  Final   Special Requests NONE  Final   Sputum evaluation   Final    THIS SPECIMEN IS ACCEPTABLE FOR SPUTUM CULTURE Performed at [facility], [address] [place], [place] [address]    Report Status 11/17/2019 FINAL  Final     Radiological Exams on Admission: DG Chest 2 View  Result Date: 11/17/2019 CLINICAL DATA:  Shortness of breath, right-sided chest tightness, right shoulder and clavicular pain radiating to the right arm EXAM: CHEST - 2 VIEW COMPARISON:  Radiograph 04/24/2019 FINDINGS: Patchy consolidative opacities are present in the right lower lobe and lingula with associated airways thickening. Additional hazy opacities may be present in the left lung base as well. There is bilateral streaky atelectatic changes including more pronounced bandlike atelectasis in the right  lung base with asymmetric elevation of the right hemidiaphragm. The cardiomediastinal contours are unremarkable. No acute osseous or soft tissue abnormality. IMPRESSION: 1. Patchy consolidative opacities lower lungs with associated airways thickening could reflect a developing pneumonia in the appropriate clinical setting. A more masslike appearance is noted on the frontal radiograph in the periphery of the right lower lobe could reflect some more focal consolidation though could consider evaluation with CT or follow-up imaging to resolution. Electronically Signed   By: Kreg Shropshire M.D.   On: 11/17/2019 02:32   CT Angio Chest PE W and/or Wo Contrast  Result Date: 11/17/2019 CLINICAL DATA:  Right-sided chest pain and shortness of breath. High probability for pulmonary embolism. EXAM: CT ANGIOGRAPHY CHEST WITH CONTRAST TECHNIQUE: Multidetector CT imaging of the chest was performed using the standard protocol during bolus administration of intravenous contrast. Multiplanar CT image reconstructions and MIPs were obtained to evaluate the  vascular anatomy. CONTRAST:  OMNIPAQUE IOHEXOL 350 MG/ML SOLN COMPARISON:  None. FINDINGS: Cardiovascular: Pulmonary embolism is seen in posterior intralobar branches of the right lower lobe pulmonary artery. No left-sided pulmonary emboli identified. Mediastinum/Nodes: No pathologically enlarged lymph nodes or other masses identified. Lungs/Pleura: Airspace disease is seen in the right lower lobe, which could be due to pneumonia or infarct. Mild infiltrate or atelectasis is also seen in the posterior left lower lobe. No evidence of pleural effusion. Upper abdomen: No acute findings. Musculoskeletal: No suspicious bone lesions identified. Review of the MIP images confirms the above findings. IMPRESSION: Acute pulmonary embolism in posterior intralobar branches of right lower lobe pulmonary artery. Right lower lobe airspace disease, which could be due to pneumonia or pulmonary infarct. Mild infiltrate or atelectasis in posterior left lower lobe. Critical Value/emergent results were called by telephone at the time of interpretation on 11/17/2019 at 9:53 am to provider DR. Jene Every , who verbally acknowledged these results. Electronically Signed   By: Danae Orleans M.D.   On: 11/17/2019 09:57   US Venous Img Lower Bilateral (DVT)  Result Date: 11/17/2019 CLINICAL DATA:  29 year old female with history of pulmonary embolism and shortness of breath. EXAM: BILATERAL LOWER EXTREMITY VENOUS DOPPLER ULTRASOUND TECHNIQUE: Gray-scale sonography with graded compression, as well as color Doppler and duplex ultrasound were performed to evaluate the lower extremity deep venous systems from the level of the common femoral vein and including the common femoral, femoral, profunda femoral, popliteal and calf veins including the posterior tibial, peroneal and gastrocnemius veins when visible. The superficial great saphenous vein was also interrogated. Spectral Doppler was utilized to evaluate flow at rest and with distal  augmentation maneuvers in the common femoral, femoral and popliteal veins. COMPARISON:  None. FINDINGS: RIGHT LOWER EXTREMITY Common Femoral Vein: No evidence of thrombus. Normal compressibility, respiratory phasicity and response to augmentation. Saphenofemoral Junction: No evidence of thrombus. Normal compressibility and flow on color Doppler imaging. Profunda Femoral Vein: No evidence of thrombus. Normal compressibility and flow on color Doppler imaging. Femoral Vein: No evidence of thrombus. Normal compressibility, respiratory phasicity and response to augmentation. Popliteal Vein: No evidence of thrombus. Normal compressibility, respiratory phasicity and response to augmentation. Calf Veins: No evidence of thrombus. Normal compressibility and flow on color Doppler imaging. Superficial Great Saphenous Vein: No evidence of thrombus. Normal compressibility. Other Findings:  None. LEFT LOWER EXTREMITY Common Femoral Vein: No evidence of thrombus. Normal compressibility, respiratory phasicity and response to augmentation. Saphenofemoral Junction: No evidence of thrombus. Normal compressibility and flow on color Doppler imaging. Profunda Femoral Vein: No evidence of  thrombus. Normal compressibility and flow on color Doppler imaging. Femoral Vein: No evidence of thrombus. Normal compressibility, respiratory phasicity and response to augmentation. Popliteal Vein: No evidence of thrombus. Normal compressibility, respiratory phasicity and response to augmentation. Calf Veins: No evidence of thrombus. Normal compressibility and flow on color Doppler imaging. Superficial Great Saphenous Vein: No evidence of thrombus. Normal compressibility. Other Findings:  None. IMPRESSION: No evidence of bilateral lower extremity deep venous thrombosis. Electronically Signed   By: Marliss Cootsylan  Suttle MD   On: 11/17/2019 11:39     EKG: Independently reviewed.  Sinus rhythm, tachycardia, QTC 424, early R wave  progression  Assessment/Plan Principal Problem:   Pulmonary embolism (HCC) Active Problems:   CAP (community acquired pneumonia)   Depression   HTN (hypertension)   Acute respiratory failure with hypoxia (HCC)   Sepsis (HCC)   Pulmonary embolism (HCC): CTA showed acute pulmonary embolism in posterior intralobar branches of right lower lobe pulmonary artery. E dopplers negative for DVT.   -will place on med-surg bed for obs -heparin drip initiated -Hypercoag panel -pain control: When necessary Percocet and morphine -prn albuterol nebs and mucinex   Sepsis due to possible CAP (community acquired pneumonia): CTA showed right lower lobe airspace disease.  Patient does not have fever, but pt has leukocytosis with WBC 13.5, indicating possible CAP.  Patient admits critical sepsis with leukocytosis with WBC 13.5, tachycardia with heart rate of 130, tachypnea with RR 30.  Pending lactic acid.  Hemodynamic stable now.  Patient is allergic to penicillin which caused nausea, rash and mild diarrhea per patient.  - IV Rocephin and azithromycin - Mucinex for cough  - Bronchodilators - Urine legionella and S. pneumococcal antigen - Follow up blood culture x2, sputum culture - will get Procalcitonin and trend lactic acid level per sepsis protocol - IVF: 2L of NS bolus in ED, followed by 75 mL per hour of NS   Depression -Continue home Lexapro  HTN (hypertension) -Hyzaar -As needed hydralazine  Acute respiratory failure with hypoxia (HCC): Due to combination of pulmonary embolism and CAP. -Bronchodilators -Nasal cannula oxygen to maintain oxygen saturation above 93%          DVT ppx: on IV Heparin  Code Status: Full code Family Communication: Yes, patient's husband at bed side Disposition Plan:  Anticipate discharge back to previous environment Consults called:  none Admission status: Med-surg bed for obs   Status is: Observation  The patient remains OBS appropriate and will  d/c before 2 midnights.  Dispo: The patient is from: Home              Anticipated d/c is to: Home              Anticipated d/c date is: 1 day              Patient currently is not medically stable to d/c.           Date of Service 11/17/2019    Lorretta HarpXilin Lebaron Bautch [facility]   If 7PM-7AM, please contact night-coverage [email or web address] 11/17/2019, 12:16 PM

## 2019-11-17 NOTE — Consult Note (Signed)
PHARMACY -  BRIEF ANTIBIOTIC NOTE   Pharmacy has received consult(s) for levofloxacin from an ED provider.  The patient's profile has been reviewed for ht/wt/allergies/indication/available labs.    One time order(s) placed for levofloxacin 750 mg IV x 1 dose by EDP  Further antibiotics/pharmacy consults should be ordered by admitting physician if indicated.                       Thank you, Reatha Armour, PharmD Pharmacy Resident  11/17/2019 10:06 AM

## 2019-11-17 NOTE — ED Provider Notes (Signed)
[facility] Emergency Department Provider Note   ____________________________________________    I have reviewed the triage vital signs and the nursing notes.   HISTORY  Chief Complaint Shortness of Breath     HPI Alexandra Fleming is a 29 y.o. female who presents with complaints of shortness of breath and right-sided chest pain.  Patient describes this started approximately 2 days ago while working from home.  She describes significant pain in her right chest and some in her right flank which worsens with movement, deep breaths.  She reports she had COVID-19 in March, has had Covid pneumonia associated with it but feels that she is recovered but occasionally still has shortness of breath.  She does have a Mirena IUD, no history of DVT or PE.  Denies fevers chills, does have mild cough.  History reviewed. No pertinent past medical history.  Patient Active Problem List   Diagnosis Date Noted  . Pulmonary embolism (HCC) 11/17/2019  . Anxiety 03/18/2017    Past Surgical History:  Procedure Laterality Date  . WISDOM TOOTH EXTRACTION      Prior to Admission medications   Medication Sig Start Date End Date Taking? Authorizing Provider  escitalopram (LEXAPRO) 20 MG tablet TAKE 1 TABLET(20 MG) BY MOUTH DAILY 11/15/19  Yes Trey Sailors, PA-C  losartan-hydrochlorothiazide (HYZAAR) 50-12.5 MG tablet TAKE 1 TABLET BY MOUTH DAILY 11/14/19  Yes Pollak, Adriana M, PA-C  busPIRone (BUSPAR) 7.5 MG tablet Take 1 tablet (7.5 mg total) by mouth 2 (two) times daily. 07/19/19   Trey Sailors, PA-C  levonorgestrel (MIRENA) 20 MCG/24HR IUD 1 each by Intrauterine route once.    [provider]     Allergies Amoxicillin and Penicillins  Family History  Problem Relation Age of Onset  . Allergies Mother   . Breast cancer Mother 42  . High Cholesterol Father   . Hypertension Father   . Sleep apnea Father   . Diabetes Maternal Grandmother   .  Hypertension Maternal Grandmother   . COPD Maternal Grandfather   . Thyroid disease Paternal Grandmother   . Hypertension Paternal Grandmother   . Hyperlipidemia Paternal Grandmother   . Sleep apnea Paternal Grandfather   . Hypertension Paternal Grandfather   . Hyperlipidemia Paternal Grandfather   . Hypertension Paternal Aunt   . Anxiety disorder Paternal Aunt   . Hypertension Maternal Uncle     Social History Social History   Tobacco Use  . Smoking status: Never Smoker  . Smokeless tobacco: Never Used  Substance Use Topics  . Alcohol use: Yes    Alcohol/week: 6.0 standard drinks    Types: 2 Glasses of wine, 4 Cans of beer per week  . Drug use: No    Review of Systems  Constitutional: No fever/chills Eyes: No visual changes.  ENT: No sore throat. Cardiovascular: As above Respiratory: As above Gastrointestinal: No abdominal pain  Genitourinary: Negative for dysuria. Musculoskeletal: Negative for back pain. Skin: Negative for rash. Neurological: Negative for headaches   ____________________________________________   PHYSICAL EXAM:  VITAL SIGNS: ED Triage Vitals  Enc Vitals Group     BP 11/17/19 0202 (!) 145/80     Pulse Rate 11/17/19 0202 (!) 129     Resp 11/17/19 0202 (!) 26     Temp 11/17/19 0205 98.5 F (36.9 C)     Temp Source 11/17/19 0205 Oral     SpO2 11/17/19 0202 95 %     Weight 11/17/19 0203 120 kg (264 lb  8.8 oz)     Height 11/17/19 0203 1.676 m (5\' 6" )     Head Circumference --      Peak Flow --      Pain Score 11/17/19 0202 8     Pain Loc --      Pain Edu? --      Excl. in GC? --     Constitutional: Alert and oriented.   Nose: No congestion/rhinnorhea. Mouth/Throat: Mucous membranes are moist.    Cardiovascular: Tachycardia, regular rhythm. Grossly normal heart sounds.  Good peripheral circulation. Respiratory: Painful respiratory effort.  No retractions. Lungs CTAB. Gastrointestinal: Soft and nontender. No distention.  No CVA  tenderness.  Musculoskeletal: No lower extremity tenderness nor edema.  Warm and well perfused Neurologic:  Normal speech and language. No gross focal neurologic deficits are appreciated.  Skin:  Skin is warm, dry and intact. No rash noted. Psychiatric: Mood and affect are normal. Speech and behavior are normal.  ____________________________________________   LABS (all labs ordered are listed, but only abnormal results are displayed)  Labs Reviewed  BASIC METABOLIC PANEL - Abnormal; Notable for the following components:      Result Value   CO2 21 (*)    Glucose, Bld 159 (*)    BUN 21 (*)    Calcium 8.5 (*)    All other components within normal limits  CBC - Abnormal; Notable for the following components:   WBC 13.5 (*)    RBC 3.74 (*)    Hemoglobin 8.3 (*)    HCT 27.1 (*)    MCV 72.5 (*)    MCH 22.2 (*)    All other components within normal limits  FIBRIN DERIVATIVES D-DIMER ([facility] ONLY) - Abnormal; Notable for the following components:   Fibrin derivatives D-dimer [facility] (*)    All other components within normal limits  RESPIRATORY PANEL BY RT PCR (FLU A&B, COVID)  CULTURE, BLOOD (ROUTINE X 2)  CULTURE, BLOOD (ROUTINE X 2)  APTT  PROTIME-INR  HEPARIN LEVEL (UNFRACTIONATED)  POC URINE PREG, ED  TROPONIN I (HIGH SENSITIVITY)  TROPONIN I (HIGH SENSITIVITY)   ____________________________________________  EKG  ED ECG REPORT I, 4,765.46, the attending physician, personally viewed and interpreted this ECG.  Date: 11/17/2019  Rhythm: Sinus tachycardia QRS Axis: normal Intervals: normal ST/T Wave abnormalities: normal Narrative Interpretation: no evidence of acute ischemia  ____________________________________________  RADIOLOGY  Chest x-ray reviewed by me, possible infiltrates bilaterally, pending radiology review   ____________________________________________   PROCEDURES  Procedure(s) performed: No  Procedures   Critical Care performed:  yes  CRITICAL CARE Performed by: 11/19/2019   Total critical care time: 40 minutes  Critical care time was exclusive of separately billable procedures and treating other patients.  Critical care was necessary to treat or prevent imminent or life-threatening deterioration.  Critical care was time spent personally by me on the following activities: development of treatment plan with patient and/or surrogate as well as nursing, discussions with consultants, evaluation of patient's response to treatment, examination of patient, obtaining history from patient or surrogate, ordering and performing treatments and interventions, ordering and review of laboratory studies, ordering and review of radiographic studies, pulse oximetry and re-evaluation of patient's condition.  ____________________________________________   INITIAL IMPRESSION / ASSESSMENT AND PLAN / ED COURSE  Pertinent labs & imaging results that were available during my care of the patient were reviewed by me and considered in my medical decision making (see chart for details).  Patient presents with shortness of breath,  chest discomfort, right-sided as described above.  Highly suspicious for pulmonary embolism, she does have a Mirena IUD.  Differential also includes pneumonia, including COVID-19, pleural effusion  She is tachycardic oxygen saturations 93 to 96% on room air  Chest x-ray reviewed by me, suspicious for infiltrate/pneumonia  Given elevated D-dimer, tachycardia, right-sided pleuritic chest pain will send for CT angiography  Lab work notable for mildly elevated white blood cell count, hemoglobin of 8.3, last hemoglobin from 2020 was 13.7. No reports of dark stools/bleeding   CT angiography reviewed by me, concerning for pneumonia, pending radiology formal review.  Given elevated white blood cell count, tachycardia and concern for pneumonia will add blood cultures, lactic acid, IV antibiotics given concern for  bacterial pneumonia, Covid pneumonia is also possible  ----------------------------------------- 9:58 AM on 11/17/2019 -----------------------------------------  Contacted by radiologist and notified CTA is positive for PE as well as possible pneumonia, no report of right heart strain, will start heparin drip  Discussed with the hospitalist for admission      ____________________________________________   FINAL CLINICAL IMPRESSION(S) / ED DIAGNOSES  Final diagnoses:  Community acquired pneumonia, unspecified laterality  Multiple subsegmental pulmonary emboli without acute cor pulmonale (HCC)        Note:  This document was prepared using Dragon voice recognition software and may include unintentional dictation errors.   Jene Every, MD 11/17/19 [phone]

## 2019-11-17 NOTE — Consult Note (Signed)
ANTICOAGULATION CONSULT NOTE - Initial Consult  Pharmacy Consult for heparin Indication: pulmonary embolus  Allergies  Allergen Reactions  . Amoxicillin   . Penicillins     Patient Measurements: Height: 5\' 6"  (167.6 cm) Weight: 120 kg (264 lb 8.8 oz) IBW/kg (Calculated) : 59.3 Heparin Dosing Weight: 87.9  Vital Signs: Temp: 98 F (36.7 C) (09/25 0520) Temp Source: Oral (09/25 0520) BP: 139/95 (09/25 0936) Pulse Rate: 117 (09/25 0938)  Labs: Recent Labs    11/17/19 0209 11/17/19 0519  HGB 8.3*  --   HCT 27.1*  --   PLT 223  --   CREATININE 0.83  --   TROPONINIHS 5 5    Estimated Creatinine Clearance: 130.8 mL/min (by C-G formula based on SCr of 0.83 mg/dL).   Medical History: History reviewed. No pertinent past medical history.  Assessment: 29 year old female presenting with shortness of breath, right-sided chest tightness, and pain radiating down arm. PMH includes COVID (March). Pt has Mirena IUD, no history of DVT or PE. Appears to have no anticoagulant prior to admission. D-Dimer 2665. CT chest showing acute pulmonary embolism in posterior intralobar branches of rightlower lobe pulmonary artery.  Goal of Therapy:  Heparin level 0.3-0.7 units/ml Monitor platelets by anticoagulation protocol: Yes   Plan:  Give 5000 units bolus x 1 Start heparin infusion at 1500 units/hr Check anti-Xa level in 6 hours and daily while on heparin Continue to monitor H&H and platelets  2666, PharmD Pharmacy Resident  11/17/2019 10:20 AM

## 2019-11-17 NOTE — ED Notes (Signed)
Pt with O2 sats of 90-91 % on room air, O2 2 L [place] placed, 92% on 2 L [place], increased to 4 L [place], sats 95%

## 2019-11-17 NOTE — ED Notes (Signed)
Patient is eating lunch. NAD.

## 2019-11-17 NOTE — Consult Note (Signed)
CODE SEPSIS - PHARMACY COMMUNICATION  **Broad Spectrum Antibiotics should be administered within 1 hour of Sepsis diagnosis**  Time Code Sepsis Called/Page Received: 6629  Antibiotics Ordered: Levaquin 750 mg IV x 1 canceled by provider - azithromycin 500 mg q24h ordered @ 1050  Time of 1st antibiotic administration: 1206  Additional action taken by pharmacy: N/A  If necessary, Name of Provider/Nurse Contacted: N/A  Reatha Armour, PharmD Pharmacy Resident  11/17/2019 10:30 AM

## 2019-11-17 NOTE — Consult Note (Signed)
ANTICOAGULATION CONSULT NOTE  Pharmacy Consult for heparin Indication: pulmonary embolus  Patient Measurements: Height: 5\' 6"  (167.6 cm) Weight: 120 kg (264 lb 8.8 oz) IBW/kg (Calculated) : 59.3 Heparin Dosing Weight: 87.9  Labs: Recent Labs    11/17/19 0209 11/17/19 0519 11/17/19 1030 11/17/19 2043  HGB 8.3*  --   --   --   HCT 27.1*  --   --   --   PLT 223  --   --   --   APTT  --   --  45*  --   LABPROT  --   --  14.5  --   INR  --   --  1.2  --   HEPARINUNFRC  --   --   --  <0.10*  CREATININE 0.83  --   --   --   TROPONINIHS 5 5  --   --     Estimated Creatinine Clearance: 130.8 mL/min (by C-G formula based on SCr of 0.83 mg/dL).   Medical History: Past Medical History:  Diagnosis Date  . COVID-19 virus infection   . Depression   . HTN (hypertension)     Assessment: 29 year old female presenting with shortness of breath, right-sided chest tightness, and pain radiating down arm. PMH includes COVID (March). Pt has Mirena IUD, no history of DVT or PE. Appears to have no anticoagulant prior to admission. D-Dimer 2665. CT chest showing acute pulmonary embolism in posterior intralobar branches of rightlower lobe pulmonary artery.  Baseline aPTT 45s (slightly prolonged), PT-INR 1.2. Baseline CBC significant for Hgb 8.3 (decreased from 13.7 on 04/18/2018).   Goal of Therapy:  Heparin level 0.3-0.7 units/ml Monitor platelets by anticoagulation protocol: Yes   Plan:  --9/25 at 2043 HL < 0.10, subtherapeutic. Confirmed with RN, no interruptions in infusion. Will order heparin 2500 unit IV bolus x 1 and increase drip rate to 1800 units/hr --Re-check HL 6 hours after rate change --Daily CBC per protocol  2044  11/17/2019 10:13 PM

## 2019-11-17 NOTE — ED Notes (Signed)
Report given to Sarah RN

## 2019-11-18 ENCOUNTER — Observation Stay: Payer: Self-pay

## 2019-11-18 ENCOUNTER — Observation Stay
Admit: 2019-11-18 | Discharge: 2019-11-18 | Disposition: A | Discharge status: Home / Self Care | Payer: Self-pay | Attending: Internal Medicine | Admitting: Internal Medicine

## 2019-11-18 ENCOUNTER — Other Ambulatory Visit: Payer: Self-pay

## 2019-11-18 DIAGNOSIS — I2699 Other pulmonary embolism without acute cor pulmonale: Secondary | ICD-10-CM | POA: Diagnosis present

## 2019-11-18 LAB — STREP PNEUMONIAE URINARY ANTIGEN: Strep Pneumo Urinary Antigen: NEGATIVE

## 2019-11-18 LAB — BASIC METABOLIC PANEL
Anion gap: 7 (ref 5–15)
BUN: 16 mg/dL (ref 6–20)
CO2: 24 mmol/L (ref 22–32)
Calcium: 8 mg/dL — ABNORMAL LOW (ref 8.9–10.3)
Chloride: 105 mmol/L (ref 98–111)
Creatinine, Ser: 0.78 mg/dL (ref 0.44–1.00)
GFR calc Af Amer: >60 mL/min (ref >60)
GFR calc non Af Amer: >60 mL/min (ref >60)
Glucose, Bld: 124 mg/dL — ABNORMAL HIGH (ref 70–99)
Potassium: 3.8 mmol/L (ref 3.5–5.1)
Sodium: 136 mmol/L (ref 135–145)

## 2019-11-18 LAB — ECHOCARDIOGRAM COMPLETE
Area-P 1/2: 4.83 cm2
Height: 66 in
S' Lateral: 2.67 cm
Weight: 4232.83 oz

## 2019-11-18 LAB — CBC
HCT: 23 % — ABNORMAL LOW (ref 36.0–46.0)
Hemoglobin: 7.2 g/dL — ABNORMAL LOW (ref 12.0–15.0)
MCH: 22.4 pg — ABNORMAL LOW (ref 26.0–34.0)
MCHC: 31.3 g/dL (ref 30.0–36.0)
MCV: 71.7 fL — ABNORMAL LOW (ref 80.0–100.0)
Platelets: 208 10*3/uL (ref 150–400)
RBC: 3.21 MIL/uL — ABNORMAL LOW (ref 3.87–5.11)
RDW: 14.2 % (ref 11.5–15.5)
WBC: 9.5 10*3/uL (ref 4.0–10.5)
nRBC: 0 % (ref 0.0–0.2)

## 2019-11-18 LAB — LACTIC ACID, PLASMA: Lactic Acid, Venous: 0.8 mmol/L (ref 0.5–1.9)

## 2019-11-18 LAB — HEPARIN LEVEL (UNFRACTIONATED)
Heparin Unfractionated: 0.14 IU/mL — ABNORMAL LOW (ref 0.30–0.70)
Heparin Unfractionated: <0.1 IU/mL — ABNORMAL LOW (ref 0.30–0.70)
Heparin Unfractionated: 0.33 IU/mL (ref 0.30–0.70)

## 2019-11-18 LAB — ANTITHROMBIN III: AntiThromb III Func: 86 % (ref 75–120)

## 2019-11-18 MED ORDER — HEPARIN BOLUS VIA INFUSION
4000.0000 [IU] | Freq: Once | INTRAVENOUS | Status: AC
  Administered 2019-11-18: 4000 [IU] via INTRAVENOUS
  Filled 2019-11-18: qty 4000

## 2019-11-18 MED ORDER — HEPARIN BOLUS VIA INFUSION
5000.0000 [IU] | Freq: Once | INTRAVENOUS | Status: DC
  Filled 2019-11-18: qty 5000

## 2019-11-18 MED ORDER — HYDROMORPHONE HCL 1 MG/ML IJ SOLN
1.0000 mg | INTRAMUSCULAR | Status: DC | PRN
  Administered 2019-11-19 – 2019-11-20 (×4): 1 mg via INTRAVENOUS
  Filled 2019-11-18 (×4): qty 1

## 2019-11-18 MED ORDER — HEPARIN BOLUS VIA INFUSION
2600.0000 [IU] | Freq: Once | INTRAVENOUS | Status: AC
  Administered 2019-11-18: 2600 [IU] via INTRAVENOUS
  Filled 2019-11-18: qty 2600

## 2019-11-18 MED ORDER — IPRATROPIUM-ALBUTEROL 0.5-2.5 (3) MG/3ML IN SOLN
3.0000 mL | Freq: Four times a day (QID) | RESPIRATORY_TRACT | Status: DC
  Administered 2019-11-18 – 2019-11-20 (×8): 3 mL via RESPIRATORY_TRACT
  Filled 2019-11-18 (×8): qty 3

## 2019-11-18 NOTE — Consult Note (Signed)
ANTICOAGULATION CONSULT NOTE  Pharmacy Consult for heparin Indication: pulmonary embolus  Patient Measurements: Height: 5\' 6"  (167.6 cm) Weight: 120 kg (264 lb 8.8 oz) IBW/kg (Calculated) : 59.3 Heparin Dosing Weight: 87.9  Labs: Recent Labs    11/17/19 0209 11/17/19 0519 11/17/19 1030 11/17/19 2043 11/18/19 0231 11/18/19 1113 11/18/19 2011  HGB 8.3*  --   --   --  7.2*  --   --   HCT 27.1*  --   --   --  23.0*  --   --   PLT 223  --   --   --  208  --   --   APTT  --   --  45*  --   --   --   --   LABPROT  --   --  14.5  --   --   --   --   INR  --   --  1.2  --   --   --   --   HEPARINUNFRC  --   --   --    < > 0.14* <0.10* 0.33  CREATININE 0.83  --   --   --  0.78  --   --   TROPONINIHS 5 5  --   --   --   --   --    < > = values in this interval not displayed.    Estimated Creatinine Clearance: 135.7 mL/min (by C-G formula based on SCr of 0.78 mg/dL).   Medical History: Past Medical History:  Diagnosis Date  . COVID-19 virus infection   . Depression   . HTN (hypertension)     Assessment: 29 year old female presenting with shortness of breath, right-sided chest tightness, and pain radiating down arm. PMH includes COVID (March). Pt has Mirena IUD, no history of DVT or PE. Appears to have no anticoagulant prior to admission. D-Dimer 2665. CT chest showing acute pulmonary embolism in posterior intralobar branches of rightlower lobe pulmonary artery.  Baseline aPTT 45s (slightly prolonged), PT-INR 1.2. Baseline CBC significant for Hgb 8.3 (decreased from 13.7 on 04/18/2018).   9/25 2043 HL < 0.1 9/26 0231 HL 0.14  9/26 1113 HL < 0.1  9/26 2011 HL 0.33, therapeutic x 1  Goal of Therapy:  Heparin level 0.3-0.7 units/ml Monitor platelets by anticoagulation protocol: Yes   Plan:  Heparin level is therapeutic. Will continue heparin infusion at 2350 units/hr. Recheck heparin level in 6 hours for confirmation. CBC daily while on heparin. F/u with transition to PO  anticoagulation.   10/26, PharmD, BCPS 11/18/2019 8:53 PM

## 2019-11-18 NOTE — ED Notes (Signed)
Pt able to ambulate to bathroom without c/o SHOB or CP. Pt RA O2 sats 92%. Pt placed on 2LO2 at this time.

## 2019-11-18 NOTE — Consult Note (Addendum)
ANTICOAGULATION CONSULT NOTE  Pharmacy Consult for heparin Indication: pulmonary embolus  Patient Measurements: Height: 5\' 6"  (167.6 cm) Weight: 120 kg (264 lb 8.8 oz) IBW/kg (Calculated) : 59.3 Heparin Dosing Weight: 87.9  Labs: Recent Labs    11/17/19 0209 11/17/19 0519 11/17/19 1030 11/17/19 2043 11/18/19 0231 11/18/19 1113  HGB 8.3*  --   --   --  7.2*  --   HCT 27.1*  --   --   --  23.0*  --   PLT 223  --   --   --  208  --   APTT  --   --  45*  --   --   --   LABPROT  --   --  14.5  --   --   --   INR  --   --  1.2  --   --   --   HEPARINUNFRC  --   --   --  <0.10* 0.14* <0.10*  CREATININE 0.83  --   --   --  0.78  --   TROPONINIHS 5 5  --   --   --   --     Estimated Creatinine Clearance: 135.7 mL/min (by C-G formula based on SCr of 0.78 mg/dL).   Medical History: Past Medical History:  Diagnosis Date  . COVID-19 virus infection   . Depression   . HTN (hypertension)     Assessment: 29 year old female presenting with shortness of breath, right-sided chest tightness, and pain radiating down arm. PMH includes COVID (March). Pt has Mirena IUD, no history of DVT or PE. Appears to have no anticoagulant prior to admission. D-Dimer 2665. CT chest showing acute pulmonary embolism in posterior intralobar branches of rightlower lobe pulmonary artery.  Baseline aPTT 45s (slightly prolonged), PT-INR 1.2. Baseline CBC significant for Hgb 8.3 (decreased from 13.7 on 04/18/2018).   9/25 2043 HL < 0.1 9/26 0231 HL 0.14  9/26 1113 HL < 0.1   Goal of Therapy:  Heparin level 0.3-0.7 units/ml Monitor platelets by anticoagulation protocol: Yes   Plan:  Heparin level is subtherapeutic. Will give a 4000 unit bolus and increase heparin infusion to 2350 units/hr. Recheck heparin level in 6 hours. CBC daily while on heparin. F/u with transition to PO anticoagulation.   10/26 , PharmD, BCPS 11/18/2019 12:13 PM

## 2019-11-18 NOTE — Consult Note (Signed)
ANTICOAGULATION CONSULT NOTE  Pharmacy Consult for heparin Indication: pulmonary embolus  Patient Measurements: Height: 5\' 6"  (167.6 cm) Weight: 120 kg (264 lb 8.8 oz) IBW/kg (Calculated) : 59.3 Heparin Dosing Weight: 87.9  Labs: Recent Labs    11/17/19 0209 11/17/19 0519 11/17/19 1030 11/17/19 2043 11/18/19 0231  HGB 8.3*  --   --   --  7.2*  HCT 27.1*  --   --   --  23.0*  PLT 223  --   --   --  208  APTT  --   --  45*  --   --   LABPROT  --   --  14.5  --   --   INR  --   --  1.2  --   --   HEPARINUNFRC  --   --   --  <0.10* 0.14*  CREATININE 0.83  --   --   --  0.78  TROPONINIHS 5 5  --   --   --     Estimated Creatinine Clearance: 135.7 mL/min (by C-G formula based on SCr of 0.78 mg/dL).   Medical History: Past Medical History:  Diagnosis Date  . COVID-19 virus infection   . Depression   . HTN (hypertension)     Assessment: 29 year old female presenting with shortness of breath, right-sided chest tightness, and pain radiating down arm. PMH includes COVID (March). Pt has Mirena IUD, no history of DVT or PE. Appears to have no anticoagulant prior to admission. D-Dimer 2665. CT chest showing acute pulmonary embolism in posterior intralobar branches of rightlower lobe pulmonary artery.  Baseline aPTT 45s (slightly prolonged), PT-INR 1.2. Baseline CBC significant for Hgb 8.3 (decreased from 13.7 on 04/18/2018).   Goal of Therapy:  Heparin level 0.3-0.7 units/ml Monitor platelets by anticoagulation protocol: Yes   Plan:  --9/25 at 2043 HL < 0.10, subtherapeutic. Confirmed with RN, no interruptions in infusion. Will order heparin 2500 unit IV bolus x 1 and increase drip rate to 1800 units/hr --Re-check HL 6 hours after rate change --Daily CBC per protocol  9/26: HL @ 0231 Will order Heparin 2600 units IV X 1 bolus and increase drip rate to 2100 units/hr.  Will recheck HL 6 hrs after start of drip.   Alexandra Fleming D  11/18/2019 4:52 AM

## 2019-11-18 NOTE — Progress Notes (Signed)
Patient had pain throughout the day to upper right chest and right lowe quadrant, lateral. Pain meds given EKG performed and ECHO. Patient remains on acute O2 and heparin drip at this time, ambulating with one assist, toradol is pain med of choice this works to relieve her pain.

## 2019-11-18 NOTE — ED Notes (Signed)
Lab bedside for AM collection.

## 2019-11-18 NOTE — Progress Notes (Addendum)
PROGRESS NOTE    Alexandra Fleming  VHQ:[id number] DOB: 1990/10/13 DOA: 11/17/2019 PCP: Trey Sailors, PA-C    Assessment & Plan:   Principal Problem:   Pulmonary embolism (HCC) Active Problems:   CAP (community acquired pneumonia)   Depression   HTN (hypertension)   Acute respiratory failure with hypoxia (HCC)   Sepsis (HCC)   Pulmonary embolism: still w/ dyspnea & chest pain. CTA showed acute pulmonary embolism in posterior intralobar branches of right lower lobe pulmonary artery. [place] of b/l LE neg for DVT. Hypercoagulable work-up pending. Continue on IV heparin drip. Percocet, morphine prn for pain. Echo ordered to r/o right heart strain.   Sepsis: secondary to CAP. Meet criteria w/ leukocytosis, tachycardia & tachypnea. Continue on IV rocephin, azithromycin. Continue on bronchodilators and encourage incentive spirometry. Continue on IVFs. Blood cxs NGTD  CAP: continue on home dose of IV rocephin, azithromycin & bronchodilators. Encourage incentive spirometry. Legionella, strep are pending   Acute hypoxic respiratory failure: secondary to PE and CAP. Continue on supplemental oxygen and wean as tolerated. Encourage incentive spirometry. Management as stated above  Microcytic anemia: etiology unclear. Will check iron panel.   Depression: severity unknown. Continue on home dose of lexapro   HTN: continue on home dose of losartan, HCTZ. IV hydralazine prn   Morbid obesity: BMI 43.7. Complicates overall care and prognosis     DVT prophylaxis: heparin drip Code Status: full  Family Communication: discussed pt's care w/ pt's family at bedside and answered their questions Disposition Plan: likely d/c back home  Status is: Observation  The patient will require care spanning > 2 midnights and should be moved to inpatient because: Ongoing diagnostic testing needed not appropriate for outpatient work up, IV treatments appropriate due to intensity of illness or  inability to take PO and Inpatient level of care appropriate due to severity of illness  Dispo: The patient is from: Home              Anticipated d/c is to: Home              Anticipated d/c date is: 2 days              Patient currently is not medically stable to d/c.   Consultants:      Procedures:    Antimicrobials:    Subjective: Pt c/o shortness of breath and chest pain   Objective: Vitals:   11/18/19 0130 11/18/19 0200 11/18/19 0230 11/18/19 0537  BP: 123/78 138/64 131/76 130/77  Pulse: (!) 102 (!) 102 (!) 105 (!) 109  Resp: (!) 21 (!) Temp:   98.5 F (36.9 C) 99.1 F (37.3 C)  TempSrc:   Oral   SpO2: (!) 88% 90% 94% 98%  Weight:      Height:        Intake/Output Summary (Last 24 hours) at 11/18/2019 0801 Last data filed at 11/18/2019 0600 Gross per 24 hour  Intake 2697.65 ml  Output --  Net 2697.65 ml   Filed Weights   11/17/19 0203  Weight: 120 kg    Examination:  General exam: Appears uncomfortable. Morbidly obese Respiratory system: diminished breath sounds b/l  Cardiovascular system: S1 & S2 +. No rubs, gallops or clicks. . Gastrointestinal system: Abdomen is obese, soft and nontender.  Normal bowel sounds heard. Central nervous system: Alert and oriented. Moves all 4 extremities  Psychiatry: JuJasa Dundonppear normal. Flat mood and affect     Data Reviewed:  I have personally reviewed following labs and imaging studies  CBC: Recent Labs  Lab 11/17/19 0209 11/18/19 0231  WBC 13.5* 9.5  HGB 8.3* 7.2*  HCT 27.1* 23.0*  MCV 72.5* 71.7*  PLT 223 208   Basic Metabolic Panel: Recent Labs  Lab 11/17/19 0209 11/18/19 0231  NA 137 136  K 3.7 3.8  CL 105 105  CO2 21* 24  GLUCOSE 159* 124*  BUN 21* 16  CREATININE 0.83 0.78  CALCIUM 8.5* 8.0*   GFR: Estimated Creatinine Clearance: 135.7 mL/min (by C-G formula based on SCr of 0.78 mg/dL). Liver Function Tests: No results for input(s): AST, ALT, ALKPHOS, BILITOT,  PROT, ALBUMIN in the last 168 hours. No results for input(s): LIPASE, AMYLASE in the last 168 hours. No results for input(s): AMMONIA in the last 168 hours. Coagulation Profile: Recent Labs  Lab 11/17/19 1030  INR 1.2   Cardiac Enzymes: No results for input(s): CKTOTAL, CKMB, CKMBINDEX, TROPONINI in the last 168 hours. BNP (last 3 results) No results for input(s): PROBNP in the last 8760 hours. HbA1C: No results for input(s): HGBA1C in the last 72 hours. CBG: No results for input(s): GLUCAP in the last 168 hours. Lipid Profile: No results for input(s): CHOL, HDL, LDLCALC, TRIG, CHOLHDL, LDLDIRECT in the last 72 hours. Thyroid Function Tests: No results for input(s): TSH, T4TOTAL, FREET4, T3FREE, THYROIDAB in the last 72 hours. Anemia Panel: No results for input(s): VITAMINB12, FOLATE, FERRITIN, TIBC, IRON, RETICCTPCT in the last 72 hours. Sepsis Labs: Recent Labs  Lab 11/17/19 1150 11/18/19 0231  PROCALCITON 0.11  --   LATICACIDVEN 0.8 0.8    Recent Results (from the past 240 hour(s))  Respiratory Panel by RT PCR (Flu A&B, Covid) - Nasopharyngeal Swab     Status: None   Collection Time: 11/17/19  9:04 AM   Specimen: Nasopharyngeal Swab  Result Value Ref Range Status   SARS Coronavirus 2 by RT PCR NEGATIVE NEGATIVE Final    Comment: (NOTE) SARS-CoV-2 target nucleic acids are NOT DETECTED.  The SARS-CoV-2 RNA is generally detectable in upper respiratoy specimens during the acute phase of infection. The lowest concentration of SARS-CoV-2 viral copies this assay can detect is 131 copies/mL. A negative result does not preclude SARS-Cov-2 infection and should not be used as the sole basis for treatment or other patient management decisions. A negative result may occur with  improper specimen collection/handling, submission of specimen other than nasopharyngeal swab, presence of viral mutation(s) within the areas targeted by this assay, and inadequate number of viral  copies (<131 copies/mL). A negative result must be combined with clinical observations, patient history, and epidemiological information. The expected result is Negative.  Fact Sheet for Patients:  https://www.moore.com/  Fact Sheet for Healthcare Providers:  https://www.young.biz/  This test is no t yet approved or cleared by the [place] FDA and  has been authorized for detection and/or diagnosis of SARS-CoV-2 by FDA under an Emergency Use Authorization (EUA). This EUA will remain  in effect (meaning this test can be used) for the duration of the COVID-19 declaration under Section 564(b)(1) of the Act, 21 U.S.C. section 360bbb-3(b)(1), unless the authorization is terminated or revoked sooner.     Influenza A by PCR NEGATIVE NEGATIVE Final   Influenza B by PCR NEGATIVE NEGATIVE Final    Comment: (NOTE) The Xpert Xpress SARS-CoV-2/FLU/RSV assay is intended as an aid in  the diagnosis of influenza from Nasopharyngeal swab specimens and  should not be used as a sole basis for  treatment. Nasal washings and  aspirates are unacceptable for Xpert Xpress SARS-CoV-2/FLU/RSV  testing.  Fact Sheet for Patients: https://www.moore.com/  Fact Sheet for Healthcare Providers: https://www.young.biz/  This test is not yet approved or cleared by the [place] FDA and  has been authorized for detection and/or diagnosis of SARS-CoV-2 by  FDA under an Emergency Use Authorization (EUA). This EUA will remain  in effect (meaning this test can be used) for the duration of the  Covid-19 declaration under Section 564(b)(1) of the Act, 21  U.S.C. section 360bbb-3(b)(1), unless the authorization is  terminated or revoked. Performed at [facility], [address] [place], [place] [address]   Blood culture (routine x 2)     Status: None (Preliminary result)   Collection Time: 11/17/19 10:31 AM   Specimen:  BLOOD  Result Value Ref Range Status   Specimen Description BLOOD LEFT AC  Final   Special Requests   Final    BOTTLES DRAWN AEROBIC AND ANAEROBIC Blood Culture results may not be optimal due to an excessive volume of blood received in culture bottles   Culture   Final    NO GROWTH < 24 HOURS Performed at [facility], [address] [place], [place] [address]    Report Status PENDING  Incomplete  Blood culture (routine x 2)     Status: None (Preliminary result)   Collection Time: 11/17/19 10:31 AM   Specimen: BLOOD  Result Value Ref Range Status   Specimen Description BLOOD RIGHT AC  Final   Special Requests   Final    BOTTLES DRAWN AEROBIC AND ANAEROBIC Blood Culture results may not be optimal due to an excessive volume of blood received in culture bottles   Culture   Final    NO GROWTH < 24 HOURS Performed at [facility], [address], [place], [place] [address]    Report Status PENDING  Incomplete  Culture, sputum-assessment     Status: None   Collection Time: 11/17/19 11:04 AM   Specimen: Sputum  Result Value Ref Range Status   Specimen Description SPUTUM  Final   Special Requests NONE  Final   Sputum evaluation   Final    THIS SPECIMEN IS ACCEPTABLE FOR SPUTUM CULTURE Performed at [facility], [address], [place], [place] [address]    Report Status 11/17/2019 FINAL  Final         Radiology Studies: DG Chest 2 View  Result Date: 11/17/2019 CLINICAL DATA:  Shortness of breath, right-sided chest tightness, right shoulder and clavicular pain radiating to the right arm EXAM: CHEST - 2 VIEW COMPARISON:  Radiograph 04/24/2019 FINDINGS: Patchy consolidative opacities are present in the right lower lobe and lingula with associated airways thickening. Additional hazy opacities may be present in the left lung base as well. There is bilateral streaky atelectatic changes including more pronounced bandlike atelectasis in the right lung base  with asymmetric elevation of the right hemidiaphragm. The cardiomediastinal contours are unremarkable. No acute osseous or soft tissue abnormality. IMPRESSION: 1. Patchy consolidative opacities lower lungs with associated airways thickening could reflect a developing pneumonia in the appropriate clinical setting. A more masslike appearance is noted on the frontal radiograph in the periphery of the right lower lobe could reflect some more focal consolidation though could consider evaluation with CT or follow-up imaging to resolution. Electronically Signed   By: Kreg Shropshire M.D.   On: 11/17/2019 02:32   CT Angio Chest PE W and/or Wo Contrast  Result Date: 11/17/2019 CLINICAL DATA:  Right-sided chest pain and shortness of breath. High probability for pulmonary embolism. EXAM: CT ANGIOGRAPHY CHEST WITH CONTRAST TECHNIQUE: Multidetector CT imaging of the chest was performed using the standard protocol during bolus administration of intravenous contrast. Multiplanar CT image reconstructions and MIPs were obtained to evaluate the vascular anatomy. CONTRAST:  100mL OMNIPAQUE IOHEXOL 350 MG/ML SOLN COMPARISON:  None. FINDINGS: Cardiovascular: Pulmonary embolism is seen in posterior intralobar branches of the right lower lobe pulmonary artery. No left-sided pulmonary emboli identified. Mediastinum/Nodes: No pathologically enlarged lymph nodes or other masses identified. Lungs/Pleura: Airspace disease is seen in the right lower lobe, which could be due to pneumonia or infarct. Mild infiltrate or atelectasis is also seen in the posterior left lower lobe. No evidence of pleural effusion. Upper abdomen: No acute findings. Musculoskeletal: No suspicious bone lesions identified. Review of the MIP images confirms the above findings. IMPRESSION: Acute pulmonary embolism in posterior intralobar branches of right lower lobe pulmonary artery. Right lower lobe airspace disease, which could be due to pneumonia or pulmonary infarct.  Mild infiltrate or atelectasis in posterior left lower lobe. Critical Value/emergent results were called by telephone at the time of interpretation on 11/17/2019 at 9:53 am to provider DR. Jene EveryOBERT KINNER , who verbally acknowledged these results. Electronically Signed   By: Danae OrleansJohn A Stahl M.D.   On: 11/17/2019 09:57   US Venous Img Lower Bilateral (DVT)  Result Date: 11/17/2019 CLINICAL DATA:  29 year old female with history of pulmonary embolism and shortness of breath. EXAM: BILATERAL LOWER EXTREMITY VENOUS DOPPLER ULTRASOUND TECHNIQUE: Gray-scale sonography with graded compression, as well as color Doppler and duplex ultrasound were performed to evaluate the lower extremity deep venous systems from the level of the common femoral vein and including the common femoral, femoral, profunda femoral, popliteal and calf veins including the posterior tibial, peroneal and gastrocnemius veins when visible. The superficial great saphenous vein was also interrogated. Spectral Doppler was utilized to evaluate flow at rest and with distal augmentation maneuvers in the common femoral, femoral and popliteal veins. COMPARISON:  None. FINDINGS: RIGHT LOWER EXTREMITY Common Femoral Vein: No evidence of thrombus. Normal compressibility, respiratory phasicity and response to augmentation. Saphenofemoral Junction: No evidence of thrombus. Normal compressibility and flow on color Doppler imaging. Profunda Femoral Vein: No evidence of thrombus. Normal compressibility and flow on color Doppler imaging. Femoral Vein: No evidence of thrombus. Normal compressibility, respiratory phasicity and response to augmentation. Popliteal Vein: No evidence of thrombus. Normal compressibility, respiratory phasicity and response to augmentation. Calf Veins: No evidence of thrombus. Normal compressibility and flow on color Doppler imaging. Superficial Great Saphenous Vein: No evidence of thrombus. Normal compressibility. Other Findings:  None. LEFT LOWER  EXTREMITY Common Femoral Vein: No evidence of thrombus. Normal compressibility, respiratory phasicity and response to augmentation. Saphenofemoral Junction: No evidence of thrombus. Normal compressibility and flow on color Doppler imaging. Profunda Femoral Vein: No evidence of thrombus. Normal compressibility and flow on color Doppler imaging. Femoral Vein: No evidence of thrombus. Normal compressibility, respiratory phasicity and response to augmentation. Popliteal Vein: No evidence of thrombus. Normal compressibility, respiratory phasicity and response to augmentation. Calf Veins: No evidence of thrombus. Normal compressibility and flow on color Doppler imaging. Superficial Great Saphenous Vein: No evidence of thrombus. Normal compressibility. Other Findings:  None. IMPRESSION: No evidence of bilateral lower extremity deep venous thrombosis. Electronically Signed   By: Marliss Cootsylan  Suttle MD   On: 11/17/2019 11:39        Scheduled Meds: . escitalopram  20  mg Oral Daily  . losartan  50 mg Oral Daily   And  . hydrochlorothiazide  12.5 mg Oral Daily   Continuous Infusions: . sodium chloride 75 mL/hr at 11/18/19 0600  . azithromycin Stopped (11/17/19 1329)  . heparin 2,100 Units/hr (11/18/19 0600)  . sodium chloride       LOS: 0 days    Time spent: 35 mins     Charise Killian, MD [facility] Pager [phone]  If 7PM-7AM, please contact night-coverage [email or web address] 11/18/2019, 8:01 AM

## 2019-11-19 ENCOUNTER — Telehealth: Payer: Self-pay | Admitting: Physician Assistant

## 2019-11-19 LAB — BASIC METABOLIC PANEL
Anion gap: 10 (ref 5–15)
BUN: 14 mg/dL (ref 6–20)
CO2: 23 mmol/L (ref 22–32)
Calcium: 7.7 mg/dL — ABNORMAL LOW (ref 8.9–10.3)
Chloride: 105 mmol/L (ref 98–111)
Creatinine, Ser: 0.91 mg/dL (ref 0.44–1.00)
GFR calc Af Amer: >60 mL/min (ref >60)
GFR calc non Af Amer: >60 mL/min (ref >60)
Glucose, Bld: 123 mg/dL — ABNORMAL HIGH (ref 70–99)
Potassium: 3.6 mmol/L (ref 3.5–5.1)
Sodium: 138 mmol/L (ref 135–145)

## 2019-11-19 LAB — HEPARIN LEVEL (UNFRACTIONATED)
Heparin Unfractionated: 0.27 IU/mL — ABNORMAL LOW (ref 0.30–0.70)
Heparin Unfractionated: 0.19 IU/mL — ABNORMAL LOW (ref 0.30–0.70)
Heparin Unfractionated: 0.36 IU/mL (ref 0.30–0.70)

## 2019-11-19 LAB — IRON AND TIBC
Iron: 8 ug/dL — ABNORMAL LOW (ref 28–170)
Saturation Ratios: 2 % — ABNORMAL LOW (ref 10.4–31.8)
TIBC: 354 ug/dL (ref 250–450)
UIBC: 346 ug/dL

## 2019-11-19 LAB — CBC
HCT: 21.6 % — ABNORMAL LOW (ref 36.0–46.0)
Hemoglobin: 6.4 g/dL — ABNORMAL LOW (ref 12.0–15.0)
MCH: 21.7 pg — ABNORMAL LOW (ref 26.0–34.0)
MCHC: 29.6 g/dL — ABNORMAL LOW (ref 30.0–36.0)
MCV: 73.2 fL — ABNORMAL LOW (ref 80.0–100.0)
Platelets: 273 10*3/uL (ref 150–400)
RBC: 2.95 MIL/uL — ABNORMAL LOW (ref 3.87–5.11)
RDW: 14.2 % (ref 11.5–15.5)
WBC: 10.7 10*3/uL — ABNORMAL HIGH (ref 4.0–10.5)
nRBC: 0 % (ref 0.0–0.2)

## 2019-11-19 LAB — HEMOGLOBIN AND HEMATOCRIT, BLOOD
HCT: 22.1 % — ABNORMAL LOW (ref 36.0–46.0)
HCT: 22.8 % — ABNORMAL LOW (ref 36.0–46.0)
Hemoglobin: 6.9 g/dL — ABNORMAL LOW (ref 12.0–15.0)
Hemoglobin: 7 g/dL — ABNORMAL LOW (ref 12.0–15.0)

## 2019-11-19 LAB — PREPARE RBC (CROSSMATCH)

## 2019-11-19 LAB — ABO/RH: ABO/RH(D): B POS

## 2019-11-19 LAB — FERRITIN: Ferritin: 72 ng/mL (ref 11–307)

## 2019-11-19 MED ORDER — SODIUM CHLORIDE 0.9 % IV SOLN
150.0000 mg | INTRAVENOUS | Status: AC
  Administered 2019-11-19 – 2019-11-21 (×3): 150 mg via INTRAVENOUS
  Filled 2019-11-19 (×3): qty 7.5

## 2019-11-19 MED ORDER — SODIUM CHLORIDE 0.9% IV SOLUTION: Freq: Once | INTRAVENOUS | Status: AC

## 2019-11-19 MED ORDER — OXYCODONE HCL 5 MG PO TABS
10.0000 mg | ORAL_TABLET | ORAL | Status: DC | PRN
  Filled 2019-11-19: qty 2

## 2019-11-19 MED ORDER — HEPARIN BOLUS VIA INFUSION
2600.0000 [IU] | Freq: Once | INTRAVENOUS | Status: AC
  Administered 2019-11-19: 2600 [IU] via INTRAVENOUS
  Filled 2019-11-19: qty 2600

## 2019-11-19 MED ORDER — APIXABAN 5 MG PO TABS
10.0000 mg | ORAL_TABLET | Freq: Two times a day (BID) | ORAL | Status: DC
  Administered 2019-11-19 – 2019-11-21 (×4): 10 mg via ORAL
  Filled 2019-11-19 (×4): qty 2

## 2019-11-19 MED ORDER — APIXABAN 5 MG PO TABS: 5.0000 mg | ORAL_TABLET | Freq: Two times a day (BID) | ORAL | Status: DC

## 2019-11-19 MED ORDER — SODIUM CHLORIDE 0.9 % IV SOLN
INTRAVENOUS | Status: DC | PRN
  Administered 2019-11-19 – 2019-11-20 (×2): 250 mL via INTRAVENOUS

## 2019-11-19 MED ORDER — HEPARIN BOLUS VIA INFUSION
1300.0000 [IU] | Freq: Once | INTRAVENOUS | Status: AC
  Administered 2019-11-19: 1300 [IU] via INTRAVENOUS
  Filled 2019-11-19: qty 1300

## 2019-11-19 NOTE — Consult Note (Signed)
ANTICOAGULATION CONSULT NOTE  Pharmacy Consult for heparin Indication: pulmonary embolus  Patient Measurements: Height: 5\' 6"  (167.6 cm) Weight: 120 kg (264 lb 8.8 oz) IBW/kg (Calculated) : 59.3 Heparin Dosing Weight: 87.9  Labs: Recent Labs     0000 11/17/19 0209 11/17/19 0519 11/17/19 1030 11/17/19 2043 11/18/19 0231 11/18/19 1113 11/18/19 2011 11/19/19 0211 11/19/19 1009  HGB   < > 8.3*  --   --   --  7.2*  --   --  6.4*  --   HCT  --  27.1*  --   --   --  23.0*  --   --  21.6*  --   PLT  --  223  --   --   --  208  --   --  273  --   APTT  --   --   --  45*  --   --   --   --   --   --   LABPROT  --   --   --  14.5  --   --   --   --   --   --   INR  --   --   --  1.2  --   --   --   --   --   --   HEPARINUNFRC  --   --   --   --    < > 0.14*   < > 0.33 0.27* 0.19*  CREATININE  --  0.83  --   --   --  0.78  --   --  0.91  --   TROPONINIHS  --  5 5  --   --   --   --   --   --   --    < > = values in this interval not displayed.    Estimated Creatinine Clearance: 119.3 mL/min (by C-G formula based on SCr of 0.91 mg/dL).   Medical History: Past Medical History:  Diagnosis Date  . COVID-19 virus infection   . Depression   . HTN (hypertension)     Assessment: 29 year old female presenting with shortness of breath, right-sided chest tightness, and pain radiating down arm. PMH includes COVID (March). Pt has Mirena IUD, no history of DVT or PE. Appears to have no anticoagulant prior to admission. D-Dimer 2665. CT chest showing acute pulmonary embolism in posterior intralobar branches of rightlower lobe pulmonary artery.  Baseline aPTT 45s (slightly prolonged), PT-INR 1.2. Baseline CBC significant for Hgb 8.3 (decreased from 13.7 on 04/18/2018).   9/25 2043 HL < 0.1 9/26 0231 HL 0.14  9/26 1113 HL < 0.1  9/26 2011 HL 0.33, therapeutic x 1 9/27:  HL @ 0211 = 0.27 Will order Heparin 1300 units IV X 1 bolus and increase drip rate to 2500 units/hr. 9/27   Goal of  Therapy:  Heparin level 0.3-0.7 units/ml Monitor platelets by anticoagulation protocol: Yes   Plan:  9/27:  HL @ 1009 = 0.19 Will order Heparin 2600 units IV X 1 bolus and increase drip rate to 2700 units/hr. Will recheck HL 6 hrs after rate change.  Note that Hgb 6.4  plt 273. MD has ordered blood and recheck H&H this afternoon. Discussed with RN- no signs of bleeding noted. RN will continue to assess.  CBC daily while on heparin. F/u with transition to PO anticoagulation.   Kayvion Arneson A 11/19/2019 11:38 AM

## 2019-11-19 NOTE — Progress Notes (Signed)
Pt had a score of 4 on the RT assessment. The nebulizer treatments are changed to QID.

## 2019-11-19 NOTE — Consult Note (Signed)
ANTICOAGULATION CONSULT NOTE  Pharmacy Consult for heparin Indication: pulmonary embolus  Patient Measurements: Height: 5\' 6"  (167.6 cm) Weight: 120 kg (264 lb 8.8 oz) IBW/kg (Calculated) : 59.3 Heparin Dosing Weight: 87.9  Labs: Recent Labs     0000 11/17/19 0209 11/17/19 0519 11/17/19 1030 11/17/19 2043 11/18/19 0231 11/18/19 0231 11/18/19 1113 11/18/19 2011 11/19/19 0211  HGB   < > 8.3*  --   --   --  7.2*  --   --   --  6.4*  HCT  --  27.1*  --   --   --  23.0*  --   --   --  21.6*  PLT  --  223  --   --   --  208  --   --   --  273  APTT  --   --   --  45*  --   --   --   --   --   --   LABPROT  --   --   --  14.5  --   --   --   --   --   --   INR  --   --   --  1.2  --   --   --   --   --   --   HEPARINUNFRC  --   --   --   --    < > 0.14*   < > <0.10* 0.33 0.27*  CREATININE  --  0.83  --   --   --  0.78  --   --   --  0.91  TROPONINIHS  --  5 5  --   --   --   --   --   --   --    < > = values in this interval not displayed.    Estimated Creatinine Clearance: 119.3 mL/min (by C-G formula based on SCr of 0.91 mg/dL).   Medical History: Past Medical History:  Diagnosis Date  . COVID-19 virus infection   . Depression   . HTN (hypertension)     Assessment: 29 year old female presenting with shortness of breath, right-sided chest tightness, and pain radiating down arm. PMH includes COVID (March). Pt has Mirena IUD, no history of DVT or PE. Appears to have no anticoagulant prior to admission. D-Dimer 2665. CT chest showing acute pulmonary embolism in posterior intralobar branches of rightlower lobe pulmonary artery.  Baseline aPTT 45s (slightly prolonged), PT-INR 1.2. Baseline CBC significant for Hgb 8.3 (decreased from 13.7 on 04/18/2018).   9/25 2043 HL < 0.1 9/26 0231 HL 0.14  9/26 1113 HL < 0.1  9/26 2011 HL 0.33, therapeutic x 1  Goal of Therapy:  Heparin level 0.3-0.7 units/ml Monitor platelets by anticoagulation protocol: Yes   Plan:  Heparin  level is therapeutic. Will continue heparin infusion at 2350 units/hr. Recheck heparin level in 6 hours for confirmation. CBC daily while on heparin. F/u with transition to PO anticoagulation.   9/27:  HL @ 0211 = 0.27 Will order Heparin 1300 units IV X 1 bolus and increase drip rate to 2500 units/hr. Will recheck HL 6 hrs after rate change.   Alexandra Fleming D 11/19/2019 3:29 AM

## 2019-11-19 NOTE — Telephone Encounter (Signed)
Patient's mother Lebron Conners is calling to let Ricki Rodriguez know that the patient is in the hospital with blood clots on her lungs, and ne[place]. And her hemoglobin is low discussing possible blood transfusion. Mother wanted Ricki Rodriguez to know since Ricki Rodriguez is very caring and supportive. Please advise CB- [phone]

## 2019-11-19 NOTE — Progress Notes (Signed)
PROGRESS NOTE    Alexandra Fleming  ESP:[id number] DOB: 1990-05-31 DOA: 11/17/2019 PCP: Trey Sailors, PA-C    Assessment & Plan:   Principal Problem:   Pulmonary embolism (HCC) Active Problems:   CAP (community acquired pneumonia)   Depression   HTN (hypertension)   Acute respiratory failure with hypoxia (HCC)   Sepsis (HCC)   Pulmonary emboli (HCC)   Pulmonary embolism: still w/ shortness of breath & chest pain today. CTA showed acute pulmonary embolism in posterior intralobar branches of right lower lobe pulmonary artery. [place] of b/l LE neg for DVT. Etiology unclear, denies any long plane or car rides, no recent surgeries but has sedentary lifestyle and has IUD. Hypercoagulable work-up pending. Will d/c IV heparin drip & start eliquis if repeat H&H has improved. Pain meds changed to IV dilaudid & oxycodone prn as other pain meds were not improving pt's pain. Echo shows EF 55-60%, grade I diastolic dysfunction, no atrial shunts, & no right heart strain.   Sepsis: secondary to CAP. Meet criteria w/ leukocytosis, tachycardia & tachypnea. Continue on IV rocephin, azithromycin. Continue on bronchodilators and encourage incentive spirometry. Continue on IVFs. Blood cxs NGTD  CAP: continue on home dose of IV rocephin, azithromycin & bronchodilators. Encourage incentive spirometry. Legionella is pending. Strept is neg  Acute hypoxic respiratory failure: secondary to PE and CAP. Continue on supplemental oxygen and wean as tolerated, currently on 3L [place]. Encourage incentive spirometry. 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 Will start IV iron. Will transfuse 1 unit of pRBCs 11/19/19.   Depression: severity unknown. Continue on home dose of lexapro   HTN: continue on home dose of ARB, HCTZ.   Morbid obesity: BMI 43.7. Complicates overall care and prognosis     DVT prophylaxis: heparin drip Code Status: full  Family Communication: discussed  pt's care w/ pt's husband at bedside and answered his questions Disposition Plan: likely d/c back home  Status is: Inpatient  Remains inpatient appropriate because:IV treatments appropriate due to intensity of illness or inability to take PO and Inpatient level of care appropriate due to severity of illness   Dispo: The patient is from: Home              Anticipated d/c is to: Home              Anticipated d/c date is: 3 days              Patient currently is not medically stable to d/c.     Consultants:      Procedures:    Antimicrobials:    Subjective: Pt c/o shortness of breath & chest pain which slightly worse than day prior.   Objective: Vitals:   11/18/19 1210 11/18/19 1231 11/18/19 2004 11/19/19 0405  BP:  120/65 (!) 119/59 122/68  Pulse: (!) 101 98 (!) 108 (!) 104  Resp: 18 18 20 20   Temp:  98.6 F (37 C) 98.4 F (36.9 C) 98.2 F (36.8 C)  TempSrc:  Oral Oral Oral  SpO2: 93% 95% 94% 94%  Weight:      Height:        Intake/Output Summary (Last 24 hours) at 11/19/2019 0819 Last data filed at 11/19/2019 0650 Gross per 24 hour  Intake 2285.47 ml  Output 0 ml  Net 2285.47 ml   Filed Weights   11/17/19 0203  Weight: 120 kg    Examination:  General exam: Appears calm & comfortable. Morbidly obese  Respiratory system: diminished breath sounds b/l  Cardiovascular system: S1/S2+. No rubs or gallops Gastrointestinal system: Abdomen is obese, soft and nontender.  Hypoactive bowel sounds  Central nervous system: Alert and oriented. Moves all 4 extremities  Psychiatry: judgement and insight appear normal. Normal mood and affect     Data Reviewed: I have personally reviewed following labs and imaging studies  CBC: Recent Labs  Lab 11/17/19 0209 11/18/19 0231 11/19/19 0211  WBC 13.5* 9.5 10.7*  HGB 8.3* 7.2* 6.4*  HCT 27.1* 23.0* 21.6*  MCV 72.5* 71.7* 73.2*  PLT 223 208 273   Basic Metabolic Panel: Recent Labs  Lab 11/17/19 0209  11/18/19 0231 11/19/19 0211  NA 137 136 138  K 3.7 3.8 3.6  CL 105 105 105  CO2 21* 24 23  GLUCOSE 159* 124* 123*  BUN 21* 16 14  CREATININE 0.83 0.78 0.91  CALCIUM 8.5* 8.0* 7.7*   GFR: Estimated Creatinine Clearance: 119.3 mL/min (by C-G formula based on SCr of 0.91 mg/dL). Liver Function Tests: No results for input(s): AST, ALT, ALKPHOS, BILITOT, PROT, ALBUMIN in the last 168 hours. No results for input(s): LIPASE, AMYLASE in the last 168 hours. No results for input(s): AMMONIA in the last 168 hours. Coagulation Profile: Recent Labs  Lab 11/17/19 1030  INR 1.2   Cardiac Enzymes: No results for input(s): CKTOTAL, CKMB, CKMBINDEX, TROPONINI in the last 168 hours. BNP (last 3 results) No results for input(s): PROBNP in the last 8760 hours. HbA1C: No results for input(s): HGBA1C in the last 72 hours. CBG: No results for input(s): GLUCAP in the last 168 hours. Lipid Profile: No results for input(s): CHOL, HDL, LDLCALC, TRIG, CHOLHDL, LDLDIRECT in the last 72 hours. Thyroid Function Tests: No results for input(s): TSH, T4TOTAL, FREET4, T3FREE, THYROIDAB in the last 72 hours. Anemia Panel: Recent Labs    11/19/19 0211  FERRITIN 72  TIBC 354  IRON 8*   Sepsis Labs: Recent Labs  Lab 11/17/19 1150 11/18/19 0231  PROCALCITON 0.11  --   LATICACIDVEN 0.8 0.8    Recent Results (from the past 240 hour(s))  Respiratory Panel by RT PCR (Flu A&B, Covid) - Nasopharyngeal Swab     Status: None   Collection Time: 11/17/19  9:04 AM   Specimen: Nasopharyngeal Swab  Result Value Ref Range Status   SARS Coronavirus 2 by RT PCR NEGATIVE NEGATIVE Final    Comment: (NOTE) SARS-CoV-2 target nucleic acids are NOT DETECTED.  The SARS-CoV-2 RNA is generally detectable in upper respiratoy specimens during the acute phase of infection. The lowest concentration of SARS-CoV-2 viral copies this assay can detect is 131 copies/mL. A negative result does not preclude SARS-Cov-2 infection  and should not be used as the sole basis for treatment or other patient management decisions. A negative result may occur with  improper specimen collection/handling, submission of specimen other than nasopharyngeal swab, presence of viral mutation(s) within the areas targeted by this assay, and inadequate number of viral copies (<131 copies/mL). A negative result must be combined with clinical observations, patient history, and epidemiological information. The expected result is Negative.  Fact Sheet for Patients:  https://www.moore.com/  Fact Sheet for Healthcare Providers:  https://www.young.biz/  This test is no t yet approved or cleared by the [place] FDA and  has been authorized for detection and/or diagnosis of SARS-CoV-2 by FDA under an Emergency Use Authorization (EUA). This EUA will remain  in effect (meaning this test can be used) for the duration of the COVID-19 declaration  under Section 564(b)(1) of the Act, 21 U.S.C. section 360bbb-3(b)(1), unless the authorization is terminated or revoked sooner.     Influenza A by PCR NEGATIVE NEGATIVE Final   Influenza B by PCR NEGATIVE NEGATIVE Final    Comment: (NOTE) The Xpert Xpress SARS-CoV-2/FLU/RSV assay is intended as an aid in  the diagnosis of influenza from Nasopharyngeal swab specimens and  should not be used as a sole basis for treatment. Nasal washings and  aspirates are unacceptable for Xpert Xpress SARS-CoV-2/FLU/RSV  testing.  Fact Sheet for Patients: https://www.moore.com/  Fact Sheet for Healthcare Providers: https://www.young.biz/  This test is not yet approved or cleared by the [place] FDA and  has been authorized for detection and/or diagnosis of SARS-CoV-2 by  FDA under an Emergency Use Authorization (EUA). This EUA will remain  in effect (meaning this test can be used) for the duration of the  Covid-19 declaration  under Section 564(b)(1) of the Act, 21  U.S.C. section 360bbb-3(b)(1), unless the authorization is  terminated or revoked. Performed at [facility], [address] [place], [place] [address]   Blood culture (routine x 2)     Status: None (Preliminary result)   Collection Time: 11/17/19 10:31 AM   Specimen: BLOOD  Result Value Ref Range Status   Specimen Description BLOOD LEFT AC  Final   Special Requests   Final    BOTTLES DRAWN AEROBIC AND ANAEROBIC Blood Culture results may not be optimal due to an excessive volume of blood received in culture bottles   Culture   Final    NO GROWTH 2 DAYS Performed at [facility], [address], [place], [place] [address]    Report Status PENDING  Incomplete  Blood culture (routine x 2)     Status: None (Preliminary result)   Collection Time: 11/17/19 10:31 AM   Specimen: BLOOD  Result Value Ref Range Status   Specimen Description BLOOD RIGHT AC  Final   Special Requests   Final    BOTTLES DRAWN AEROBIC AND ANAEROBIC Blood Culture results may not be optimal due to an excessive volume of blood received in culture bottles   Culture   Final    NO GROWTH 2 DAYS Performed at [facility], [address], [place], [place] [address]    Report Status PENDING  Incomplete  Culture, sputum-assessment     Status: None   Collection Time: 11/17/19 11:04 AM   Specimen: Sputum  Result Value Ref Range Status   Specimen Description SPUTUM  Final   Special Requests NONE  Final   Sputum evaluation   Final    THIS SPECIMEN IS ACCEPTABLE FOR SPUTUM CULTURE Performed at [facility], [address] [place], [place] [address]    Report Status 11/17/2019 FINAL  Final  Culture, respiratory     Status: None (Preliminary result)   Collection Time: 11/17/19 11:04 AM   Specimen: SPU  Result Value Ref Range Status   Specimen Description   Final    SPUTUM Performed at [facility], [address]  [place], [place] [address]    Special Requests   Final    NONE Reflexed from [phone] Performed at [facility], [address] [place], [place] [address]    Gram Stain   Final    RARE WBC PRESENT, PREDOMINANTLY PMN MODERATE GRAM POSITIVE COCCI IN PAIRS IN CHAINS FEW GRAM NEGATIVE RODS RARE GRAM POSITIVE RODS Performed at [facility], [address]  [place], [place] [address]    Culture PENDING  Incomplete   Report Status PENDING  Incomplete         Radiology Studies: CT Angio Chest PE W and/or Wo Contrast  Result Date: 11/17/2019 CLINICAL DATA:  Right-sided chest pain and shortness of breath. High probability for pulmonary embolism. EXAM: CT ANGIOGRAPHY CHEST WITH CONTRAST TECHNIQUE: Multidetector CT imaging of the chest was performed using the standard protocol during bolus administration of intravenous contrast. Multiplanar CT image reconstructions and MIPs were obtained to evaluate the vascular anatomy. CONTRAST:  OMNIPAQUE IOHEXOL 350 MG/ML SOLN COMPARISON:  None. FINDINGS: Cardiovascular: Pulmonary embolism is seen in posterior intralobar branches of the right lower lobe pulmonary artery. No left-sided pulmonary emboli identified. Mediastinum/Nodes: No pathologically enlarged lymph nodes or other masses identified. Lungs/Pleura: Airspace disease is seen in the right lower lobe, which could be due to pneumonia or infarct. Mild infiltrate or atelectasis is also seen in the posterior left lower lobe. No evidence of pleural effusion. Upper abdomen: No acute findings. Musculoskeletal: No suspicious bone lesions identified. Review of the MIP images confirms the above findings. IMPRESSION: Acute pulmonary embolism in posterior intralobar branches of right lower lobe pulmonary artery. Right lower lobe airspace disease, which could be due to pneumonia or pulmonary infarct. Mild infiltrate or atelectasis in posterior left lower lobe. Critical Value/emergent results were called by  telephone at the time of interpretation on 11/17/2019 at 9:53 am to provider DR. Jene Every , who verbally acknowledged these results. Electronically Signed   By: Danae Orleans M.D.   On: 11/17/2019 09:57   US Venous Img Lower Bilateral (DVT)  Result Date: 11/17/2019 CLINICAL DATA:  29 year old female with history of pulmonary embolism and shortness of breath. EXAM: BILATERAL LOWER EXTREMITY VENOUS DOPPLER ULTRASOUND TECHNIQUE: Gray-scale sonography with graded compression, as well as color Doppler and duplex ultrasound were performed to evaluate the lower extremity deep venous systems from the level of the common femoral vein and including the common femoral, femoral, profunda femoral, popliteal and calf veins including the posterior tibial, peroneal and gastrocnemius veins when visible. The superficial great saphenous vein was also interrogated. Spectral Doppler was utilized to evaluate flow at rest and with distal augmentation maneuvers in the common femoral, femoral and popliteal veins. COMPARISON:  None. FINDINGS: RIGHT LOWER EXTREMITY Common Femoral Vein: No evidence of thrombus. Normal compressibility, respiratory phasicity and response to augmentation. Saphenofemoral Junction: No evidence of thrombus. Normal compressibility and flow on color Doppler imaging. Profunda Femoral Vein: No evidence of thrombus. Normal compressibility and flow on color Doppler imaging. Femoral Vein: No evidence of thrombus. Normal compressibility, respiratory phasicity and response to augmentation. Popliteal Vein: No evidence of thrombus. Normal compressibility, respiratory phasicity and response to augmentation. Calf Veins: No evidence of thrombus. Normal compressibility and flow on color Doppler imaging. Superficial Great Saphenous Vein: No evidence of thrombus. Normal compressibility. Other Findings:  None. LEFT LOWER EXTREMITY Common Femoral Vein: No evidence of thrombus. Normal compressibility, respiratory phasicity and  response to augmentation. Saphenofemoral Junction: No evidence of thrombus. Normal compressibility and flow on color Doppler imaging. Profunda Femoral Vein: No evidence of thrombus. Normal compressibility and flow on color Doppler imaging. Femoral Vein: No evidence of thrombus. Normal compressibility, respiratory phasicity and response to augmentation. Popliteal Vein: No evidence of thrombus. Normal compressibility, respiratory phasicity and response to augmentation. Calf Veins: No evidence of thrombus. Normal compressibility and flow on color Doppler imaging. Superficial Great Saphenous Vein: No evidence of thrombus. Normal compressibility. Other Findings:  None. IMPRESSION:  No evidence of bilateral lower extremity deep venous thrombosis. Electronically Signed   By: Marliss Coots MD   On: 11/17/2019 11:39   DG Chest Port 1 View  Result Date: 11/18/2019 CLINICAL DATA:  COVID and acute pulmonary embolism. EXAM: PORTABLE CHEST 1 VIEW COMPARISON:  11/17/2019. FINDINGS: Increasing bilateral LOWER lung airspace opacities are noted. The cardiomediastinal silhouette is unchanged. A probable small RIGHT pleural effusion is noted. There is no evidence of pneumothorax or acute bony abnormality. IMPRESSION: Increasing bilateral LOWER lung airspace opacities/pneumonia. Probable small RIGHT pleural effusion. Electronically Signed   By: Harmon Pier M.D.   On: 11/18/2019 09:24   ECHOCARDIOGRAM COMPLETE  Result Date: 11/18/2019    ECHOCARDIOGRAM REPORT   Patient Name:   JAELEY WIKER Date of Exam: 11/18/2019 Medical Rec #:  [id number]                Height:       66.0 in Accession #:    [id number]               Weight:       264.6 lb Date of Birth:  08/30/1990               BSA:          2.252 m Patient Age:    28 years                 BP:           130/77 mmHg Patient Gender: F                        HR:           105 bpm. Exam Location:  [facility] Procedure: 2D Echo Indications:     Pulmonary Embolus 415.19 / I26.99   History:         Patient has no prior history of Echocardiogram examinations.                  Risk Factors:Hypertension. Sepsis.  Sonographer:     Johnathan Hausen Referring Phys:  [id number] Charise Killian Diagnosing Phys: Adrian Blackwater MD IMPRESSIONS  1. Left ventricular ejection fraction, by estimation, is 55 to 60%. The left ventricle has normal function. The left ventricle has no regional wall motion abnormalities. Left ventricular diastolic parameters are consistent with Grade I diastolic dysfunction (impaired relaxation).  2. Right ventricular systolic function is normal. The right ventricular size is normal.  3. The mitral valve is normal in structure. Trivial mitral valve regurgitation. No evidence of mitral stenosis.  4. The aortic valve is normal in structure. Aortic valve regurgitation is not visualized. No aortic stenosis is present.  5. The inferior vena cava is normal in size with greater than 50% respiratory variability, suggesting right atrial pressure of 3 mmHg. FINDINGS  Left Ventricle: Left ventricular ejection fraction, by estimation, is 55 to 60%. The left ventricle has normal function. The left ventricle has no regional wall motion abnormalities. The left ventricular internal cavity size was normal in size. There is  no left ventricular hypertrophy. Left ventricular diastolic parameters are consistent with Grade I diastolic dysfunction (impaired relaxation). Right Ventricle: The right ventricular size is normal. No increase in right ventricular wall thickness. Right ventricular systolic function is normal. Left Atrium: Left atrial size was normal in size. Right Atrium: Right atrial size was normal in size. Pericardium: There is no evidence of pericardial effusion. Mitral Valve: The mitral  valve is normal in structure. Trivial mitral valve regurgitation. No evidence of mitral valve stenosis. Tricuspid Valve: The tricuspid valve is normal in structure. Tricuspid valve regurgitation is trivial. No  evidence of tricuspid stenosis. Aortic Valve: The aortic valve is normal in structure. Aortic valve regurgitation is not visualized. No aortic stenosis is present. Pulmonic Valve: The pulmonic valve was normal in structure. Pulmonic valve regurgitation is trivial. No evidence of pulmonic stenosis. Aorta: The aortic root is normal in size and structure. Venous: The inferior vena cava is normal in size with greater than 50% respiratory variability, suggesting right atrial pressure of 3 mmHg. IAS/Shunts: No atrial level shunt detected by color flow Doppler.  LEFT VENTRICLE PLAX 2D LVIDd:         3.89 cm  Diastology LVIDs:         2.67 cm  LV e' medial:    10.30 cm/s LV PW:         0.92 cm  LV E/e' medial:  10.3 LV IVS:        1.26 cm  LV e' lateral:   18.70 cm/s LVOT diam:     1.90 cm  LV E/e' lateral: 5.7 LVOT Area:     2.84 cm  RIGHT VENTRICLE             IVC RV S prime:     17.60 cm/s  IVC diam: 2.02 cm TAPSE (M-mode): 2.7 cm LEFT ATRIUM             Index       RIGHT ATRIUM           Index LA diam:        4.00 cm 1.78 cm/m  RA Area:     15.90 cm LA Vol (A2C):   61.8 ml 27.44 ml/m RA Volume:   41.90 ml  18.60 ml/m LA Vol (A4C):   43.5 ml 19.31 ml/m LA Biplane Vol: 52.6 ml 23.35 ml/m   AORTA Ao Root diam: 2.90 cm MITRAL VALVE                TRICUSPID VALVE MV Area (PHT): 4.83 cm     TR Peak grad:   31.6 mmHg MV Decel Time: 157 msec     TR Vmax:        281.00 cm/s MV E velocity: 106.00 cm/s MV A velocity: 92.40 cm/s   SHUNTS MV E/A ratio:  1.15         Systemic Diam: 1.90 cm Adrian BlackwaterShaukat Khan MD Electronically signed by Adrian BlackwaterShaukat Khan MD Signature Date/Time: 11/18/2019/2:17:17 PM    Final         Scheduled Meds: . sodium chloride   Intravenous Once  . escitalopram  20 mg Oral Daily  . losartan  50 mg Oral Daily   And  . hydrochlorothiazide  12.5 mg Oral Daily  . ipratropium-albuterol  3 mL Nebulization QID   Continuous Infusions: . azithromycin Stopped (11/18/19 1816)  . heparin 2,500 Units/hr  (11/19/19 0329)  . iron sucrose    . sodium chloride       LOS: 1 day    Time spent: 33 mins     Charise KillianJamiese M Zaineb Nowaczyk, MD [facility] Pager [phone]  If 7PM-7AM, please contact night-coverage [email or web address] 11/19/2019, 8:19 AM

## 2019-11-19 NOTE — Consult Note (Signed)
ANTICOAGULATION CONSULT NOTE  Pharmacy Consult for Apixaban Indication: pulmonary embolus  Patient Measurements: Height: 5\' 6"  (167.6 cm) Weight: 120 kg (264 lb 8.8 oz) IBW/kg (Calculated) : 59.3  Labs: Recent Labs     0000 11/17/19 0209 11/17/19 0519 11/17/19 1030 11/17/19 2043 11/18/19 0231 11/18/19 1113 11/19/19 0211 11/19/19 1009 11/19/19 1637  HGB   < > 8.3*  --   --   --  7.2*  --  6.4*  --  6.9*  HCT   < > 27.1*  --   --   --  23.0*  --  21.6*  --  22.1*  PLT  --  223  --   --   --  208  --  273  --   --   APTT  --   --   --  45*  --   --   --   --   --   --   LABPROT  --   --   --  14.5  --   --   --   --   --   --   INR  --   --   --  1.2  --   --   --   --   --   --   HEPARINUNFRC  --   --   --   --    < > 0.14*   < > 0.27* 0.19* 0.36  CREATININE  --  0.83  --   --   --  0.78  --  0.91  --   --   TROPONINIHS  --  5 5  --   --   --   --   --   --   --    < > = values in this interval not displayed.    Estimated Creatinine Clearance: 119.3 mL/min (by C-G formula based on SCr of 0.91 mg/dL).   Medical History: Past Medical History:  Diagnosis Date  . COVID-19 virus infection   . Depression   . HTN (hypertension)     Assessment: 29 year old female presenting with shortness of breath, right-sided chest tightness, and pain radiating down arm. PMH includes COVID (March). Pt has Mirena IUD, no history of DVT or PE. Appears to have no anticoagulant prior to admission. D-Dimer 2665. CT chest showing acute pulmonary embolism in posterior intralobar branches of rightlower lobe pulmonary artery.  Baseline aPTT 45s (slightly prolonged), PT-INR 1.2. ATIII 87% (normal). Hypercoagulable work-up labs pending. Baseline CBC significant for anemia thought to be resultant of iron deficiency.   Patient initially started on heparin infusion. Pharmacy has now been consulted to convert heparin infusion to apixaban.     Plan:  --9/27 at 1637 HL = 0.36, therapeutic. Continue  heparin infusion at 2700 units/hr. Current rate is suspicious for heparin resistance however ATIII was normal. Stop heparin drip at 2200. --Upon discontinuation of heparin infusion, start apixaban 10 mg BID x 7 days followed by apixaban 5 mg BID for duration of treatment --CBC per protocol  10/27 11/19/2019 5:46 PM

## 2019-11-20 LAB — CULTURE, RESPIRATORY W GRAM STAIN: Culture: NORMAL

## 2019-11-20 LAB — CBC
HCT: 22.8 % — ABNORMAL LOW (ref 36.0–46.0)
Hemoglobin: 7 g/dL — ABNORMAL LOW (ref 12.0–15.0)
MCH: 22.6 pg — ABNORMAL LOW (ref 26.0–34.0)
MCHC: 30.7 g/dL (ref 30.0–36.0)
MCV: 73.5 fL — ABNORMAL LOW (ref 80.0–100.0)
Platelets: 319 10*3/uL (ref 150–400)
RBC: 3.1 MIL/uL — ABNORMAL LOW (ref 3.87–5.11)
RDW: 14.6 % (ref 11.5–15.5)
WBC: 9.7 10*3/uL (ref 4.0–10.5)
nRBC: 0 % (ref 0.0–0.2)

## 2019-11-20 LAB — CARDIOLIPIN ANTIBODIES, IGG, IGM, IGA
Anticardiolipin IgA: <9 APL U/mL (ref 0–11)
Anticardiolipin IgG: 83 GPL U/mL — ABNORMAL HIGH (ref 0–14)
Anticardiolipin IgM: 17 MPL U/mL — ABNORMAL HIGH (ref 0–12)

## 2019-11-20 LAB — BASIC METABOLIC PANEL
Anion gap: 8 (ref 5–15)
BUN: 8 mg/dL (ref 6–20)
CO2: 24 mmol/L (ref 22–32)
Calcium: 7.9 mg/dL — ABNORMAL LOW (ref 8.9–10.3)
Chloride: 103 mmol/L (ref 98–111)
Creatinine, Ser: 0.76 mg/dL (ref 0.44–1.00)
GFR calc Af Amer: >60 mL/min (ref >60)
GFR calc non Af Amer: >60 mL/min (ref >60)
Glucose, Bld: 114 mg/dL — ABNORMAL HIGH (ref 70–99)
Potassium: 3.7 mmol/L (ref 3.5–5.1)
Sodium: 135 mmol/L (ref 135–145)

## 2019-11-20 LAB — BETA-2-GLYCOPROTEIN I ABS, IGG/M/A
Beta-2 Glyco I IgG: 76 GPI IgG units — ABNORMAL HIGH (ref 0–20)
Beta-2-Glycoprotein I IgA: 10 GPI IgA units (ref 0–25)
Beta-2-Glycoprotein I IgM: <9 GPI IgM units (ref 0–32)

## 2019-11-20 LAB — LEGIONELLA PNEUMOPHILA SEROGP 1 UR AG: L. pneumophila Serogp 1 Ur Ag: NEGATIVE

## 2019-11-20 LAB — HEMOGLOBIN AND HEMATOCRIT, BLOOD
HCT: 24.2 % — ABNORMAL LOW (ref 36.0–46.0)
Hemoglobin: 7.8 g/dL — ABNORMAL LOW (ref 12.0–15.0)

## 2019-11-20 LAB — PROTEIN C, TOTAL: Protein C, Total: 60 % (ref 60–150)

## 2019-11-20 LAB — HOMOCYSTEINE: Homocysteine: 8.5 umol/L (ref 0.0–14.5)

## 2019-11-20 MED ORDER — IPRATROPIUM-ALBUTEROL 0.5-2.5 (3) MG/3ML IN SOLN
3.0000 mL | Freq: Three times a day (TID) | RESPIRATORY_TRACT | Status: DC
  Administered 2019-11-20 – 2019-11-21 (×4): 3 mL via RESPIRATORY_TRACT
  Filled 2019-11-20 (×4): qty 3

## 2019-11-20 MED ORDER — LORAZEPAM 2 MG/ML IJ SOLN
0.5000 mg | Freq: Once | INTRAMUSCULAR | Status: AC
  Administered 2019-11-20: 0.5 mg via INTRAVENOUS
  Filled 2019-11-20: qty 1

## 2019-11-20 NOTE — Progress Notes (Signed)
PROGRESS NOTE    Alexandra Fleming  JKK:[id number] DOB: 22-Aug-1990 DOA: 11/17/2019 PCP: Trey Sailors, PA-C    Assessment & Plan:   Principal Problem:   Pulmonary embolism (HCC) Active Problems:   CAP (community acquired pneumonia)   Depression   HTN (hypertension)   Acute respiratory failure with hypoxia (HCC)   Sepsis (HCC)   Pulmonary emboli (HCC)   Pulmonary embolism: still w/ shortness of breath & chest pain today. CTA showed acute pulmonary embolism in posterior intralobar branches of right lower lobe pulmonary artery. [place] of b/l LE neg for DVT. Etiology unclear, denies any long plane or car rides, no recent surgeries but has sedentary lifestyle and has IUD. Hypercoagulable work-up pending still. Continue on eliquis. IV dilaudid and oxycodone prn. Echo shows EF 55-60%, grade I diastolic dysfunction, no atrial shunts, & no right heart strain.   Sepsis: secondary to CAP. Meet criteria w/ leukocytosis, tachycardia & tachypnea. Continue on IV rocephin, azithromycin. Continue on bronchodilators and encourage incentive spirometry. Continue on IVFs. Blood cxs NGTD  CAP: continue on home dose of IV rocephin, azithromycin & bronchodilators. Encourage incentive spirometry. Legionella is pending. Strept is neg  Acute hypoxic respiratory failure: secondary to PE and CAP. Respiratory status improved today. Weaned off of supplemental oxygen today   IDA: w/ component of acute blood loss anemia. Will continue IV iron. Hx of heavy periods. S/p  transfuse 1 unit of pRBCs 11/19/19 but H&H barely improved. Repeat H&H this afternoon. Pt denies any hematuria, hematemesis, hemoptysis, melena. No more NSAIDs, aspirin, pt verbalized her understanding     Depression: severity unknown. Continue on home dose of lexapro  HTN: will continue on home dose of losartan, HCTZ  Morbid obesity: BMI 43.7. Complicates overall care and prognosis     DVT prophylaxis: heparin drip Code Status: full    Family Communication: discussed pt's care w/ pt's husband at bedside and answered his questions Disposition Plan: likely d/c back home if H&H is stable and/or trending up   Status is: Inpatient  Remains inpatient appropriate because:IV treatments appropriate due to intensity of illness or inability to take PO and Inpatient level of care appropriate due to severity of illness   Dispo: The patient is from: Home              Anticipated d/c is to: Home              Anticipated d/c date is  1 day               Patient currently is not medically stable to d/c.     Consultants:      Procedures:    Antimicrobials:    Subjective: Pt c/o shortness of breath, improved from day prior   Objective: Vitals:   11/19/19 1741 11/19/19 1818 11/19/19 2035 11/20/19 0533  BP: 120/74 120/61 124/61 (!) 152/99  Pulse: (!) 111  (!) 105 (!) 110  Resp: 16  20 18   Temp: 100.3 F (37.9 C) 99.7 F (37.6 C) 98.4 F (36.9 C) 98.9 F (37.2 C)  TempSrc: Oral Oral Oral Oral  SpO2: 98%  96% 97%  Weight:      Height:        Intake/Output Summary (Last 24 hours) at 11/20/2019 0756 Last data filed at 11/20/2019 0044 Gross per 24 hour  Intake 2704.33 ml  Output --  Net 2704.33 ml   Filed Weights   11/17/19 0203  Weight: 120 kg    Examination:  General  exam: Appears calm and comfortable. Morbidly obese  Respiratory system: decreased breath sounds b/l. Cardiovascular system:  S1/S2+. No rubs or gallops  Gastrointestinal system: Abdomen is obese, soft and nontender.  Hypoactive bowel sounds  Central nervous system: Alert and oriented. Moves all 4 extremities   Psychiatry: judgement and insight appear normal. Normal mood and affect     Data Reviewed: I have personally reviewed following labs and imaging studies  CBC: Recent Labs  Lab 11/17/19 0209 11/17/19 0209 11/18/19 0231 11/19/19 0211 11/19/19 1637 11/19/19 2145 11/20/19 0457  WBC 13.5*  --  9.5 10.7*  --   --  9.7  HGB  8.3*   < > 7.2* 6.4* 6.9* 7.0* 7.0*  HCT 27.1*   < > 23.0* 21.6* 22.1* 22.8* 22.8*  MCV 72.5*  --  71.7* 73.2*  --   --  73.5*  PLT 223  --  208 273  --   --  319   < > = values in this interval not displayed.   Basic Metabolic Panel: Recent Labs  Lab 11/17/19 0209 11/18/19 0231 11/19/19 0211 11/20/19 0457  NA 137 136 138 135  K 3.7 3.8 3.6 3.7  CL 105 105 105 103  CO2 21* 24 23 24   GLUCOSE 159* 124* 123* 114*  BUN 21* 16 14 8   CREATININE 0.83 0.78 1.470.91 0.76  CALCIUM 8.5* 8.0* 7.7* 7.9*   GFR: Estimated Creatinine Clearance: 135.7 mL/min (by C-G formula based on SCr of 0.76 mg/dL). Liver Function Tests: No results for input(s): AST, ALT, ALKPHOS, BILITOT, PROT, ALBUMIN in the last 168 hours. No results for input(s): LIPASE, AMYLASE in the last 168 hours. No results for input(s): AMMONIA in the last 168 hours. Coagulation Profile: Recent Labs  Lab 11/17/19 1030  INR 1.2   Cardiac Enzymes: No results for input(s): CKTOTAL, CKMB, CKMBINDEX, TROPONINI in the last 168 hours. BNP (last 3 results) No results for input(s): PROBNP in the last 8760 hours. HbA1C: No results for input(s): HGBA1C in the last 72 hours. CBG: No results for input(s): GLUCAP in the last 168 hours. Lipid Profile: No results for input(s): CHOL, HDL, LDLCALC, TRIG, CHOLHDL, LDLDIRECT in the last 72 hours. Thyroid Function Tests: No results for input(s): TSH, T4TOTAL, FREET4, T3FREE, THYROIDAB in the last 72 hours. Anemia Panel: Recent Labs    11/19/19 0211  FERRITIN 72  TIBC 354  IRON 8*   Sepsis Labs: Recent Labs  Lab 11/17/19 1150 11/18/19 0231  PROCALCITON 0.11  --   LATICACIDVEN 0.8 0.8    Recent Results (from the past 240 hour(s))  Respiratory Panel by RT PCR (Flu A&B, Covid) - Nasopharyngeal Swab     Status: None   Collection Time: 11/17/19  9:04 AM   Specimen: Nasopharyngeal Swab  Result Value Ref Range Status   SARS Coronavirus 2 by RT PCR NEGATIVE NEGATIVE Final    Comment:  (NOTE) SARS-CoV-2 target nucleic acids are NOT DETECTED.  The SARS-CoV-2 RNA is generally detectable in upper respiratoy specimens during the acute phase of infection. The lowest concentration of SARS-CoV-2 viral copies this assay can detect is 131 copies/mL. A negative result does not preclude SARS-Cov-2 infection and should not be used as the sole basis for treatment or other patient management decisions. A negative result may occur with  improper specimen collection/handling, submission of specimen other than nasopharyngeal swab, presence of viral mutation(s) within the areas targeted by this assay, and inadequate number of viral copies (<131 copies/mL). A negative result must be  combined with clinical observations, patient history, and epidemiological information. The expected result is Negative.  Fact Sheet for Patients:  https://www.moore.com/  Fact Sheet for Healthcare Providers:  https://www.young.biz/  This test is no t yet approved or cleared by the [place] FDA and  has been authorized for detection and/or diagnosis of SARS-CoV-2 by FDA under an Emergency Use Authorization (EUA). This EUA will remain  in effect (meaning this test can be used) for the duration of the COVID-19 declaration under Section 564(b)(1) of the Act, 21 U.S.C. section 360bbb-3(b)(1), unless the authorization is terminated or revoked sooner.     Influenza A by PCR NEGATIVE NEGATIVE Final   Influenza B by PCR NEGATIVE NEGATIVE Final    Comment: (NOTE) The Xpert Xpress SARS-CoV-2/FLU/RSV assay is intended as an aid in  the diagnosis of influenza from Nasopharyngeal swab specimens and  should not be used as a sole basis for treatment. Nasal washings and  aspirates are unacceptable for Xpert Xpress SARS-CoV-2/FLU/RSV  testing.  Fact Sheet for Patients: https://www.moore.com/  Fact Sheet for Healthcare  Providers: https://www.young.biz/  This test is not yet approved or cleared by the [place] FDA and  has been authorized for detection and/or diagnosis of SARS-CoV-2 by  FDA under an Emergency Use Authorization (EUA). This EUA will remain  in effect (meaning this test can be used) for the duration of the  Covid-19 declaration under Section 564(b)(1) of the Act, 21  U.S.C. section 360bbb-3(b)(1), unless the authorization is  terminated or revoked. Performed at [facility], [address] [place], [place] [address]   Blood culture (routine x 2)     Status: None (Preliminary result)   Collection Time: 11/17/19 10:31 AM   Specimen: BLOOD  Result Value Ref Range Status   Specimen Description BLOOD LEFT AC  Final   Special Requests   Final    BOTTLES DRAWN AEROBIC AND ANAEROBIC Blood Culture results may not be optimal due to an excessive volume of blood received in culture bottles   Culture   Final    NO GROWTH 3 DAYS Performed at [facility], [address] [place], [place] [address]    Report Status PENDING  Incomplete  Blood culture (routine x 2)     Status: None (Preliminary result)   Collection Time: 11/17/19 10:31 AM   Specimen: BLOOD  Result Value Ref Range Status   Specimen Description BLOOD RIGHT AC  Final   Special Requests   Final    BOTTLES DRAWN AEROBIC AND ANAEROBIC Blood Culture results may not be optimal due to an excessive volume of blood received in culture bottles   Culture   Final    NO GROWTH 3 DAYS Performed at [facility], [address], [place], [place] [address]    Report Status PENDING  Incomplete  Culture, sputum-assessment     Status: None   Collection Time: 11/17/19 11:04 AM   Specimen: Sputum  Result Value Ref Range Status   Specimen Description SPUTUM  Final   Special Requests NONE  Final   Sputum evaluation   Final    THIS SPECIMEN IS ACCEPTABLE FOR SPUTUM CULTURE Performed at [facility], [address], [place], [place] [address]    Report Status 11/17/2019 FINAL  Final  Culture, respiratory     Status: None (Preliminary result)   Collection Time: 11/17/19 11:04 AM   Specimen: SPU  Result Value Ref Range Status   Specimen Description   Final  SPUTUM Performed at [facility], [address] [place], [place] [address]    Special Requests   Final    NONE Reflexed from [phone] Performed at [facility], [address] [place], [place] [address]    Gram Stain   Final    RARE WBC PRESENT, PREDOMINANTLY PMN MODERATE GRAM POSITIVE COCCI IN PAIRS IN CHAINS FEW GRAM NEGATIVE RODS RARE GRAM POSITIVE RODS    Culture   Final    CULTURE REINCUBATED FOR BETTER GROWTH Performed at [facility], [address], [place], [place] [address]    Report Status PENDING  Incomplete         Radiology Studies: DG Chest Port 1 View  Result Date: 11/18/2019 CLINICAL DATA:  COVID and acute pulmonary embolism. EXAM: PORTABLE CHEST 1 VIEW COMPARISON:  11/17/2019. FINDINGS: Increasing bilateral LOWER lung airspace opacities are noted. The cardiomediastinal silhouette is unchanged. A probable small RIGHT pleural effusion is noted. There is no evidence of pneumothorax or acute bony abnormality. IMPRESSION: Increasing bilateral LOWER lung airspace opacities/pneumonia. Probable small RIGHT pleural effusion. Electronically Signed   By: Harmon Pier M.D.   On: 11/18/2019 09:24   ECHOCARDIOGRAM COMPLETE  Result Date: 11/18/2019    ECHOCARDIOGRAM REPORT   Patient Name:   ESLI CLEMENTS Date of Exam: 11/18/2019 Medical Rec #:  [id number]                Height:       66.0 in Accession #:    [id number]               Weight:       264.6 lb Date of Birth:  11-27-1990               BSA:          2.252 m Patient Age:    28 years                 BP:           130/77 mmHg Patient Gender: F                        HR:           105 bpm. Exam Location:  [facility]  Procedure: 2D Echo Indications:     Pulmonary Embolus 415.19 / I26.99  History:         Patient has no prior history of Echocardiogram examinations.                  Risk Factors:Hypertension. Sepsis.  Sonographer:     Johnathan Hausen Referring Phys:  [id number] Charise Killian Diagnosing Phys: Adrian Blackwater MD IMPRESSIONS  1. Left ventricular ejection fraction, by estimation, is 55 to 60%. The left ventricle has normal function. The left ventricle has no regional wall motion abnormalities. Left ventricular diastolic parameters are consistent with Grade I diastolic dysfunction (impaired relaxation).  2. Right ventricular systolic function is normal. The right ventricular size is normal.  3. The mitral valve is normal in structure. Trivial mitral valve regurgitation. No evidence of mitral stenosis.  4. The aortic valve is normal in structure. Aortic valve regurgitation is not visualized. No aortic stenosis is present.  5. The inferior vena cava is normal in size with greater than 50% respiratory variability, suggesting right atrial pressure of 3 mmHg. FINDINGS  Left Ventricle: Left ventricular ejection fraction, by estimation, is 55 to 60%. The left ventricle  has normal function. The left ventricle has no regional wall motion abnormalities. The left ventricular internal cavity size was normal in size. There is  no left ventricular hypertrophy. Left ventricular diastolic parameters are consistent with Grade I diastolic dysfunction (impaired relaxation). Right Ventricle: The right ventricular size is normal. No increase in right ventricular wall thickness. Right ventricular systolic function is normal. Left Atrium: Left atrial size was normal in size. Right Atrium: Right atrial size was normal in size. Pericardium: There is no evidence of pericardial effusion. Mitral Valve: The mitral valve is normal in structure. Trivial mitral valve regurgitation. No evidence of mitral valve stenosis. Tricuspid Valve: The tricuspid  valve is normal in structure. Tricuspid valve regurgitation is trivial. No evidence of tricuspid stenosis. Aortic Valve: The aortic valve is normal in structure. Aortic valve regurgitation is not visualized. No aortic stenosis is present. Pulmonic Valve: The pulmonic valve was normal in structure. Pulmonic valve regurgitation is trivial. No evidence of pulmonic stenosis. Aorta: The aortic root is normal in size and structure. Venous: The inferior vena cava is normal in size with greater than 50% respiratory variability, suggesting right atrial pressure of 3 mmHg. IAS/Shunts: No atrial level shunt detected by color flow Doppler.  LEFT VENTRICLE PLAX 2D LVIDd:         3.89 cm  Diastology LVIDs:         2.67 cm  LV e' medial:    10.30 cm/s LV PW:         0.92 cm  LV E/e' medial:  10.3 LV IVS:        1.26 cm  LV e' lateral:   18.70 cm/s LVOT diam:     1.90 cm  LV E/e' lateral: 5.7 LVOT Area:     2.84 cm  RIGHT VENTRICLE             IVC RV S prime:     17.60 cm/s  IVC diam: 2.02 cm TAPSE (M-mode): 2.7 cm LEFT ATRIUM             Index       RIGHT ATRIUM           Index LA diam:        4.00 cm 1.78 cm/m  RA Area:     15.90 cm LA Vol (A2C):   61.8 ml 27.44 ml/m RA Volume:   41.90 ml  18.60 ml/m LA Vol (A4C):   43.5 ml 19.31 ml/m LA Biplane Vol: 52.6 ml 23.35 ml/m   AORTA Ao Root diam: 2.90 cm MITRAL VALVE                TRICUSPID VALVE MV Area (PHT): 4.83 cm     TR Peak grad:   31.6 mmHg MV Decel Time: 157 msec     TR Vmax:        281.00 cm/s MV E velocity: 106.00 cm/s MV A velocity: 92.40 cm/s   SHUNTS MV E/A ratio:  1.15         Systemic Diam: 1.90 cm Adrian Blackwater MD Electronically signed by Adrian Blackwater MD Signature Date/Time: 11/18/2019/2:17:17 PM    Final         Scheduled Meds: . apixaban  10 mg Oral BID   Followed by  . [START ON 11/26/2019] apixaban  5 mg Oral BID  . escitalopram  20 mg Oral Daily  . losartan  50 mg Oral Daily   And  . hydrochlorothiazide  12.5 mg Oral Daily  .  ipratropium-albuterol  3 mL Nebulization QID   Continuous Infusions: . sodium chloride Stopped (11/19/19 2028)  . azithromycin 250 mL/hr at 11/19/19 1530  . iron sucrose Stopped (11/19/19 1225)  . sodium chloride       LOS: 2 days    Time spent: 35 mins     Charise Killian, MD [facility] Pager [phone]  If 7PM-7AM, please contact night-coverage [email or web address] 11/20/2019, 7:56 AM

## 2019-11-20 NOTE — Telephone Encounter (Signed)
Returned call

## 2019-11-20 NOTE — Progress Notes (Signed)
Pt scored a 4 on the RT assessment. Nebulizer treatments are changed to TID.

## 2019-11-21 ENCOUNTER — Telehealth: Payer: Self-pay | Admitting: Internal Medicine

## 2019-11-21 DIAGNOSIS — I2699 Other pulmonary embolism without acute cor pulmonale: Secondary | ICD-10-CM

## 2019-11-21 DIAGNOSIS — I2693 Single subsegmental pulmonary embolism without acute cor pulmonale: Secondary | ICD-10-CM

## 2019-11-21 LAB — BASIC METABOLIC PANEL
Anion gap: 12 (ref 5–15)
BUN: 8 mg/dL (ref 6–20)
CO2: 25 mmol/L (ref 22–32)
Calcium: 8.3 mg/dL — ABNORMAL LOW (ref 8.9–10.3)
Chloride: 101 mmol/L (ref 98–111)
Creatinine, Ser: 0.67 mg/dL (ref 0.44–1.00)
GFR calc Af Amer: >60 mL/min (ref >60)
GFR calc non Af Amer: >60 mL/min (ref >60)
Glucose, Bld: 114 mg/dL — ABNORMAL HIGH (ref 70–99)
Potassium: 3.7 mmol/L (ref 3.5–5.1)
Sodium: 138 mmol/L (ref 135–145)

## 2019-11-21 LAB — CBC
HCT: 23.6 % — ABNORMAL LOW (ref 36.0–46.0)
Hemoglobin: 7.6 g/dL — ABNORMAL LOW (ref 12.0–15.0)
MCH: 23 pg — ABNORMAL LOW (ref 26.0–34.0)
MCHC: 32.2 g/dL (ref 30.0–36.0)
MCV: 71.3 fL — ABNORMAL LOW (ref 80.0–100.0)
Platelets: 356 10*3/uL (ref 150–400)
RBC: 3.31 MIL/uL — ABNORMAL LOW (ref 3.87–5.11)
RDW: 14.8 % (ref 11.5–15.5)
WBC: 10.4 10*3/uL (ref 4.0–10.5)
nRBC: 0.5 % — ABNORMAL HIGH (ref 0.0–0.2)

## 2019-11-21 LAB — FACTOR 5 LEIDEN

## 2019-11-21 LAB — PROTEIN S, TOTAL: Protein S Ag, Total: 103 % (ref 60–150)

## 2019-11-21 LAB — PROTEIN C ACTIVITY: Protein C Activity: 73 % (ref 73–180)

## 2019-11-21 LAB — PROTEIN S ACTIVITY: Protein S Activity: 65 % (ref 63–140)

## 2019-11-21 MED ORDER — LEVOFLOXACIN 750 MG PO TABS: 750.0000 mg | ORAL_TABLET | Freq: Every day | ORAL | 0 refills | Status: AC

## 2019-11-21 MED ORDER — HYDROXYZINE HCL 10 MG PO TABS
10.0000 mg | ORAL_TABLET | Freq: Three times a day (TID) | ORAL | Status: DC | PRN
  Administered 2019-11-21: 10 mg via ORAL
  Filled 2019-11-21 (×2): qty 1

## 2019-11-21 MED ORDER — APIXABAN (ELIQUIS) VTE STARTER PACK (10MG AND 5MG): ORAL_TABLET | ORAL | 0 refills | Status: DC

## 2019-11-21 NOTE — Discharge Summary (Signed)
Physician Discharge Summary  Alexandra Fleming ZOX:[id number] DOB: Jul 08, 1990 DOA: 11/17/2019  PCP: Trey Sailors, PA-C  Admit date: 11/17/2019 Discharge date: 11/21/2019  Admitted From: Home Disposition: Home  Recommendations for Outpatient Follow-up:  1. Follow up with PCP in 1 week 2. Follow up with hematology for anemia and abnormal hematologic workup 3. Outpatient sleep study 4. Please obtain BMP/CBC in one week 5. Please follow up on the following pending results: None  Home Health: None Equipment/Devices: None  Discharge Condition: Stable CODE STATUS: Full code Diet recommendation: Regular diet   Brief/Interim Summary:  Admission HPI written by Charise Killian, MD   HPI: Alexandra Fleming is a 29 y.o. female with medical history significant of hypertension, depression, COVID-19 infection in March, who presents with chest pain or shortness.  Patient states that she has been having chest pain shortness breath for 2 days, which has been progressively worsening.  The chest pain is located in the right side of chest, constant, sharp, pleuritic, 10 out of 10 in severity, radiating to the right shoulder, right clavicle and arm, right flank area.  Patient has cough with some brownish colored mucus production.  Denies fever or chills.  Patient does not have nausea, vomiting, diarrhea, abdominal pain, symptoms of UTI or unilateral weakness.  Denies tenderness in the calf areas. Patient was found to have oxygen desaturation to 90-91% on room air.  Currently 93-100% on 4 L oxygen. Pt has Mirena IUD.    Hospital course:  Pulmonary embolism CTA significant for right lower lobe pulmonary artery infarct. History of COVID-19 infection earlier this year. Has a progestin-only IUD. Patient started on heparin drip for treatment and transitioned to Eliquis. Patient with some tachycardia with mobility but otherwise asymptomatic. Physical therapy evaluated with no  recommendations for outpatient follow-up.  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 Discussed with hematology who recommended Lovenox or Coumadin for treatment as Eliquis is shown to be inferior. Discussed recommendations with patient and explained risks/benefits. Patient declined to stay in the hospital for Coumadin with bridge and cannot afford Lovenox injections as an outpatient and opted to continue Eliquis. Patient is to follow-up with hematology as an outpatient.  Community acquired pneumonia Patient initially given azithromycin. She completed her azithromycin but did not receive coverage for strep/stap. Patient discharged on Levaquin 750 mg for 5 days; penicillin allergy listed.  Acute respiratory failure with hypoxia Secondary to pulmonary embolism. Resolved. No oxygen requirement with ambulation.  Depression Anxiety Continue Lexapro  Iron deficiency anemia In setting of menstruating female, although no recent menses secondary to hormone IUD. Patient received three doses of Venofer prior to discharge. She will follow-up with hematology  Essential hypertension Continue Hyzaar  Morbid obesity Body mass index is 42.7 kg/m.   Sepsis Unlikely. More likely related to acute PE. Ruled out.  Somnolence I suspect patient has sleep apnea. Recommend outpatient sleep study.  Discharge Diagnoses:  Principal Problem:   Pulmonary embolism (HCC) Active Problems:   CAP (community acquired pneumonia)   Depression   HTN (hypertension)   Acute respiratory failure with hypoxia (HCC)   Pulmonary emboli [facility])    Discharge Instructions  Discharge Instructions    Call MD for:  difficulty breathing, headache or visual disturbances   Complete by: As directed    Call MD for:  severe uncontrolled pain   Complete by: As directed    Call MD for:  temperature >100.4   Complete  by: As directed     Diet - low sodium heart healthy   Complete by: As directed    Increase activity slowly   Complete by: As directed      Allergies as of 11/21/2019      Reactions   Amoxicillin    Penicillins       Medication List    STOP taking these medications   busPIRone 7.5 MG tablet Commonly known as: BUSPAR     TAKE these medications   Apixaban Starter Pack (  and ) Commonly known as: ELIQUIS STARTER PACK Take as directed on package: start with two-5mg  tablets twice daily for 7 days. On day 8, switch to one-5mg  tablet twice daily.   escitalopram 20 MG tablet Commonly known as: LEXAPRO TAKE 1 TABLET(20 MG) BY MOUTH DAILY   levofloxacin 750 MG tablet Commonly known as: Levaquin Take 1 tablet (750 mg total) by mouth daily for 5 days.   levonorgestrel 20 MCG/24HR IUD Commonly known as: MIRENA 1 each by Intrauterine route once.   losartan-hydrochlorothiazide 50-12.5 MG tablet Commonly known as: HYZAAR TAKE 1 TABLET BY MOUTH DAILY       Follow-up Information    Trey Sailors, PA-C. Schedule an appointment as soon as possible for a visit in 1 week(s).   Specialty: Physician Assistant Why: Hospital follow-up Contact information: [address] [place] [address] [phone]        Earna Coder, MD. Schedule an appointment as soon as possible for a visit in 1 week(s).   Specialties: Internal Medicine, Oncology Why: Anemia, blood clot Contact information: [address] [place] [address] [phone]              Allergies  Allergen Reactions  . Amoxicillin   . Penicillins     Consultations:  Hematology   Procedures/Studies: DG Chest 2 View  Result Date: 11/17/2019 CLINICAL DATA:  Shortness of breath, right-sided chest tightness, right shoulder and clavicular pain radiating to the right arm EXAM: CHEST - 2 VIEW COMPARISON:  Radiograph 04/24/2019 FINDINGS: Patchy consolidative opacities are present in the right  lower lobe and lingula with associated airways thickening. Additional hazy opacities may be present in the left lung base as well. There is bilateral streaky atelectatic changes including more pronounced bandlike atelectasis in the right lung base with asymmetric elevation of the right hemidiaphragm. The cardiomediastinal contours are unremarkable. No acute osseous or soft tissue abnormality. IMPRESSION: 1. Patchy consolidative opacities lower lungs with associated airways thickening could reflect a developing pneumonia in the appropriate clinical setting. A more masslike appearance is noted on the frontal radiograph in the periphery of the right lower lobe could reflect some more focal consolidation though could consider evaluation with CT or follow-up imaging to resolution. Electronically Signed   By: Kreg Shropshire M.D.   On: 11/17/2019 02:32   CT Angio Chest PE W and/or Wo Contrast  Result Date: 11/17/2019 CLINICAL DATA:  Right-sided chest pain and shortness of breath. High probability for pulmonary embolism. EXAM: CT ANGIOGRAPHY CHEST WITH CONTRAST TECHNIQUE: Multidetector CT imaging of the chest was performed using the standard protocol during bolus administration of intravenous contrast. Multiplanar CT image reconstructions and MIPs were obtained to evaluate the vascular anatomy. CONTRAST:  OMNIPAQUE IOHEXOL 350 MG/ML SOLN COMPARISON:  None. FINDINGS: Cardiovascular: Pulmonary embolism is seen in posterior intralobar branches of the right lower lobe pulmonary artery. No left-sided pulmonary emboli identified. Mediastinum/Nodes: No pathologically enlarged lymph nodes or other masses identified.  Lungs/Pleura: Airspace disease is seen in the right lower lobe, which could be due to pneumonia or infarct. Mild infiltrate or atelectasis is also seen in the posterior left lower lobe. No evidence of pleural effusion. Upper abdomen: No acute findings. Musculoskeletal: No suspicious bone lesions identified.  Review of the MIP images confirms the above findings. IMPRESSION: Acute pulmonary embolism in posterior intralobar branches of right lower lobe pulmonary artery. Right lower lobe airspace disease, which could be due to pneumonia or pulmonary infarct. Mild infiltrate or atelectasis in posterior left lower lobe. Critical Value/emergent results were called by telephone at the time of interpretation on 11/17/2019 at 9:53 am to provider DR. Jene Every , who verbally acknowledged these results. Electronically Signed   By: Danae Orleans M.D.   On: 11/17/2019 09:57   US Venous Img Lower Bilateral (DVT)  Result Date: 11/17/2019 CLINICAL DATA:  29 year old female with history of pulmonary embolism and shortness of breath. EXAM: BILATERAL LOWER EXTREMITY VENOUS DOPPLER ULTRASOUND TECHNIQUE: Gray-scale sonography with graded compression, as well as color Doppler and duplex ultrasound were performed to evaluate the lower extremity deep venous systems from the level of the common femoral vein and including the common femoral, femoral, profunda femoral, popliteal and calf veins including the posterior tibial, peroneal and gastrocnemius veins when visible. The superficial great saphenous vein was also interrogated. Spectral Doppler was utilized to evaluate flow at rest and with distal augmentation maneuvers in the common femoral, femoral and popliteal veins. COMPARISON:  None. FINDINGS: RIGHT LOWER EXTREMITY Common Femoral Vein: No evidence of thrombus. Normal compressibility, respiratory phasicity and response to augmentation. Saphenofemoral Junction: No evidence of thrombus. Normal compressibility and flow on color Doppler imaging. Profunda Femoral Vein: No evidence of thrombus. Normal compressibility and flow on color Doppler imaging. Femoral Vein: No evidence of thrombus. Normal compressibility, respiratory phasicity and response to augmentation. Popliteal Vein: No evidence of thrombus. Normal compressibility, respiratory  phasicity and response to augmentation. Calf Veins: No evidence of thrombus. Normal compressibility and flow on color Doppler imaging. Superficial Great Saphenous Vein: No evidence of thrombus. Normal compressibility. Other Findings:  None. LEFT LOWER EXTREMITY Common Femoral Vein: No evidence of thrombus. Normal compressibility, respiratory phasicity and response to augmentation. Saphenofemoral Junction: No evidence of thrombus. Normal compressibility and flow on color Doppler imaging. Profunda Femoral Vein: No evidence of thrombus. Normal compressibility and flow on color Doppler imaging. Femoral Vein: No evidence of thrombus. Normal compressibility, respiratory phasicity and response to augmentation. Popliteal Vein: No evidence of thrombus. Normal compressibility, respiratory phasicity and response to augmentation. Calf Veins: No evidence of thrombus. Normal compressibility and flow on color Doppler imaging. Superficial Great Saphenous Vein: No evidence of thrombus. Normal compressibility. Other Findings:  None. IMPRESSION: No evidence of bilateral lower extremity deep venous thrombosis. Electronically Signed   By: Marliss Coots MD   On: 11/17/2019 11:39   DG Chest Port 1 View  Result Date: 11/18/2019 CLINICAL DATA:  COVID and acute pulmonary embolism. EXAM: PORTABLE CHEST 1 VIEW COMPARISON:  11/17/2019. FINDINGS: Increasing bilateral LOWER lung airspace opacities are noted. The cardiomediastinal silhouette is unchanged. A probable small RIGHT pleural effusion is noted. There is no evidence of pneumothorax or acute bony abnormality. IMPRESSION: Increasing bilateral LOWER lung airspace opacities/pneumonia. Probable small RIGHT pleural effusion. Electronically Signed   By: Harmon Pier M.D.   On: 11/18/2019 09:24   ECHOCARDIOGRAM COMPLETE  Result Date: 11/18/2019    ECHOCARDIOGRAM REPORT   Patient Name:   SHILO PHILIPSON Date of Exam: 11/18/2019 Medical  Rec #:  [id number]                Height:        66.0 in Accession #:    [id number]               Weight:       264.6 lb Date of Birth:  1991-02-09               BSA:          2.252 m Patient Age:    28 years                 BP:           130/77 mmHg Patient Gender: F                        HR:           105 bpm. Exam Location:  [facility] Procedure: 2D Echo Indications:     Pulmonary Embolus 415.19 / I26.99  History:         Patient has no prior history of Echocardiogram examinations.                  Risk Factors:Hypertension. Sepsis.  Sonographer:     Johnathan HausenSharika Mucker Referring Phys:  [id number] Charise KillianJAMIESE M WILLIAMS Diagnosing Phys: Adrian BlackwaterShaukat Khan MD IMPRESSIONS  1. Left ventricular ejection fraction, by estimation, is 55 to 60%. The left ventricle has normal function. The left ventricle has no regional wall motion abnormalities. Left ventricular diastolic parameters are consistent with Grade I diastolic dysfunction (impaired relaxation).  2. Right ventricular systolic function is normal. The right ventricular size is normal.  3. The mitral valve is normal in structure. Trivial mitral valve regurgitation. No evidence of mitral stenosis.  4. The aortic valve is normal in structure. Aortic valve regurgitation is not visualized. No aortic stenosis is present.  5. The inferior vena cava is normal in size with greater than 50% respiratory variability, suggesting right atrial pressure of 3 mmHg. FINDINGS  Left Ventricle: Left ventricular ejection fraction, by estimation, is 55 to 60%. The left ventricle has normal function. The left ventricle has no regional wall motion abnormalities. The left ventricular internal cavity size was normal in size. There is  no left ventricular hypertrophy. Left ventricular diastolic parameters are consistent with Grade I diastolic dysfunction (impaired relaxation). Right Ventricle: The right ventricular size is normal. No increase in right ventricular wall thickness. Right ventricular systolic function is normal. Left Atrium: Left atrial size was  normal in size. Right Atrium: Right atrial size was normal in size. Pericardium: There is no evidence of pericardial effusion. Mitral Valve: The mitral valve is normal in structure. Trivial mitral valve regurgitation. No evidence of mitral valve stenosis. Tricuspid Valve: The tricuspid valve is normal in structure. Tricuspid valve regurgitation is trivial. No evidence of tricuspid stenosis. Aortic Valve: The aortic valve is normal in structure. Aortic valve regurgitation is not visualized. No aortic stenosis is present. Pulmonic Valve: The pulmonic valve was normal in structure. Pulmonic valve regurgitation is trivial. No evidence of pulmonic stenosis. Aorta: The aortic root is normal in size and structure. Venous: The inferior vena cava is normal in size with greater than 50% respiratory variability, suggesting right atrial pressure of 3 mmHg. IAS/Shunts: No atrial level shunt detected by color flow Doppler.  LEFT VENTRICLE PLAX 2D LVIDd:         3.89 cm  Diastology LVIDs:  2.67 cm  LV e' medial:    10.30 cm/s LV PW:         0.92 cm  LV E/e' medial:  10.3 LV IVS:        1.26 cm  LV e' lateral:   18.70 cm/s LVOT diam:     1.90 cm  LV E/e' lateral: 5.7 LVOT Area:     2.84 cm  RIGHT VENTRICLE             IVC RV S prime:     17.60 cm/s  IVC diam: 2.02 cm TAPSE (M-mode): 2.7 cm LEFT ATRIUM             Index       RIGHT ATRIUM           Index LA diam:        4.00 cm 1.78 cm/m  RA Area:     15.90 cm LA Vol (A2C):   61.8 ml 27.44 ml/m RA Volume:   41.90 ml  18.60 ml/m LA Vol (A4C):   43.5 ml 19.31 ml/m LA Biplane Vol: 52.6 ml 23.35 ml/m   AORTA Ao Root diam: 2.90 cm MITRAL VALVE                TRICUSPID VALVE MV Area (PHT): 4.83 cm     TR Peak grad:   31.6 mmHg MV Decel Time: 157 msec     TR Vmax:        281.00 cm/s MV E velocity: 106.00 cm/s MV A velocity: 92.40 cm/s   SHUNTS MV E/A ratio:  1.15         Systemic Diam: 1.90 cm Adrian Blackwater MD Electronically signed by Adrian Blackwater MD Signature Date/Time:  11/18/2019/2:17:17 PM    Final       Subjective: Some mild dyspnea. Anxious.   Discharge Exam: Vitals:   11/21/19 1413 11/21/19 1430  BP: 139/89   Pulse: 100   Resp: 16   Temp: 98.8 F (37.1 C)   SpO2: 94% 94%   Vitals:   11/21/19 0844 11/21/19 0906 11/21/19 1413 11/21/19 1430  BP:  (!) 146/94 139/89   Pulse:   100   Resp:   16   Temp:   98.8 F (37.1 C)   TempSrc:   Oral   SpO2: 90%  94% 94%  Weight:      Height:        General: Pt is alert, awake, not in acute distress Cardiovascular: Tachycardia, regular rhythm, S1/S2 +, no rubs, no gallops Respiratory: CTA bilaterally, no wheezing, no rhonchi Abdominal: Soft, NT, ND, bowel sounds + Extremities: no edema, no cyanosis Neuro: dozing off multiple times   The results of significant diagnostics from this hospitalization (including imaging, microbiology, ancillary and laboratory) are listed below for reference.     Microbiology: Recent Results (from the past 240 hour(s))  Respiratory Panel by RT PCR (Flu A&B, Covid) - Nasopharyngeal Swab     Status: None   Collection Time: 11/17/19  9:04 AM   Specimen: Nasopharyngeal Swab  Result Value Ref Range Status   SARS Coronavirus 2 by RT PCR NEGATIVE NEGATIVE Final    Comment: (NOTE) SARS-CoV-2 target nucleic acids are NOT DETECTED.  The SARS-CoV-2 RNA is generally detectable in upper respiratoy specimens during the acute phase of infection. The lowest concentration of SARS-CoV-2 viral copies this assay can detect is 131 copies/mL. A negative result does not preclude SARS-Cov-2 infection and should not be used as the sole  basis for treatment or other patient management decisions. A negative result may occur with  improper specimen collection/handling, submission of specimen other than nasopharyngeal swab, presence of viral mutation(s) within the areas targeted by this assay, and inadequate number of viral copies (<131 copies/mL). A negative result must be combined with  clinical observations, patient history, and epidemiological information. The expected result is Negative.  Fact Sheet for Patients:  https://www.moore.com/  Fact Sheet for Healthcare Providers:  https://www.young.biz/  This test is no t yet approved or cleared by the [place] FDA and  has been authorized for detection and/or diagnosis of SARS-CoV-2 by FDA under an Emergency Use Authorization (EUA). This EUA will remain  in effect (meaning this test can be used) for the duration of the COVID-19 declaration under Section 564(b)(1) of the Act, 21 U.S.C. section 360bbb-3(b)(1), unless the authorization is terminated or revoked sooner.     Influenza A by PCR NEGATIVE NEGATIVE Final   Influenza B by PCR NEGATIVE NEGATIVE Final    Comment: (NOTE) The Xpert Xpress SARS-CoV-2/FLU/RSV assay is intended as an aid in  the diagnosis of influenza from Nasopharyngeal swab specimens and  should not be used as a sole basis for treatment. Nasal washings and  aspirates are unacceptable for Xpert Xpress SARS-CoV-2/FLU/RSV  testing.  Fact Sheet for Patients: https://www.moore.com/  Fact Sheet for Healthcare Providers: https://www.young.biz/  This test is not yet approved or cleared by the [place] FDA and  has been authorized for detection and/or diagnosis of SARS-CoV-2 by  FDA under an Emergency Use Authorization (EUA). This EUA will remain  in effect (meaning this test can be used) for the duration of the  Covid-19 declaration under Section 564(b)(1) of the Act, 21  U.S.C. section 360bbb-3(b)(1), unless the authorization is  terminated or revoked. Performed at [facility], [address] [place], [place] [address]   Blood culture (routine x 2)     Status: None (Preliminary result)   Collection Time: 11/17/19 10:31 AM   Specimen: BLOOD  Result Value Ref Range Status   Specimen Description  BLOOD LEFT AC  Final   Special Requests   Final    BOTTLES DRAWN AEROBIC AND ANAEROBIC Blood Culture results may not be optimal due to an excessive volume of blood received in culture bottles   Culture   Final    NO GROWTH 4 DAYS Performed at [facility], [address], [place], [place] [address]    Report Status PENDING  Incomplete  Blood culture (routine x 2)     Status: None (Preliminary result)   Collection Time: 11/17/19 10:31 AM   Specimen: BLOOD  Result Value Ref Range Status   Specimen Description BLOOD RIGHT AC  Final   Special Requests   Final    BOTTLES DRAWN AEROBIC AND ANAEROBIC Blood Culture results may not be optimal due to an excessive volume of blood received in culture bottles   Culture   Final    NO GROWTH 4 DAYS Performed at [facility], [address] [place], [place] [address]    Report Status PENDING  Incomplete  Culture, sputum-assessment     Status: None   Collection Time: 11/17/19 11:04 AM   Specimen: Sputum  Result Value Ref Range Status   Specimen Description SPUTUM  Final   Special Requests NONE  Final   Sputum evaluation   Final    THIS SPECIMEN IS ACCEPTABLE FOR SPUTUM CULTURE Performed at [facility], [address]  [place], [place] [address]    Report Status 11/17/2019 FINAL  Final  Culture, respiratory     Status: None   Collection Time: 11/17/19 11:04 AM   Specimen: SPU  Result Value Ref Range Status   Specimen Description   Final    SPUTUM Performed at [facility], [address] [place], [place] [address]    Special Requests   Final    NONE Reflexed from [id number] Performed at [facility], [address] [place], [place] [address]    Gram Stain   Final    RARE WBC PRESENT, PREDOMINANTLY PMN MODERATE GRAM POSITIVE COCCI IN PAIRS IN CHAINS FEW GRAM NEGATIVE RODS RARE GRAM POSITIVE RODS    Culture   Final    ABUNDANT Normal respiratory flora-no Staph aureus or Pseudomonas  seen Performed at [facility], [address], [place], [place] [address]    Report Status 11/20/2019 FINAL  Final     Labs: BNP (last 3 results) No results for input(s): BNP in the last 8760 hours. Basic Metabolic Panel: Recent Labs  Lab 11/17/19 0209 11/18/19 0231 11/19/19 0211 11/20/19 0457 11/21/19 0343  NA 137 136 138 135 138  K 3.7 3.8 3.6 3.7 3.7  CL 105 105 105 103 101  CO2 21* 24 23 24 25   GLUCOSE 159* 124* 123* 114* 114*  BUN 21* 16 14 8 8   CREATININE 0.83 0.78 0.91 0.76 0.67  CALCIUM 8.5* 8.0* 7.7* 7.9* 8.3*   Liver Function Tests: No results for input(s): AST, ALT, ALKPHOS, BILITOT, PROT, ALBUMIN in the last 168 hours. No results for input(s): LIPASE, AMYLASE in the last 168 hours. No results for input(s): AMMONIA in the last 168 hours. CBC: Recent Labs  Lab 11/17/19 0209 11/17/19 0209 11/18/19 0231 11/18/19 0231 11/19/19 0211 11/19/19 0211 11/19/19 1637 11/19/19 2145 11/20/19 0457 11/20/19 1659 11/21/19 0343  WBC 13.5*  --  9.5  --  10.7*  --   --   --  9.7  --  10.4  HGB 8.3*   < > 7.2*   < > 6.4*   < > 6.9* 7.0* 7.0* 7.8* 7.6*  HCT 27.1*   < > 23.0*   < > 21.6*   < > 22.1* 22.8* 22.8* 24.2* 23.6*  MCV 72.5*  --  71.7*  --  73.2*  --   --   --  73.5*  --  71.3*  PLT 223  --  208  --  273  --   --   --  319  --  356   < > = values in this interval not displayed.   Cardiac Enzymes: No results for input(s): CKTOTAL, CKMB, CKMBINDEX, TROPONINI in the last 168 hours. BNP: Invalid input(s): POCBNP CBG: No results for input(s): GLUCAP in the last 168 hours. D-Dimer No results for input(s): DDIMER in the last 72 hours. Hgb A1c No results for input(s): HGBA1C in the last 72 hours. Lipid Profile No results for input(s): CHOL, HDL, LDLCALC, TRIG, CHOLHDL, LDLDIRECT in the last 72 hours. Thyroid function studies No results for input(s): TSH, T4TOTAL, T3FREE, THYROIDAB in the last 72 hours.  Invalid input(s): FREET3 Anemia work up Recent  Labs    11/19/19 0211  FERRITIN 72  TIBC 354  IRON 8*   Urinalysis No results found for: COLORURINE, APPEARANCEUR, LABSPEC, PHURINE, GLUCOSEU, HGBUR, BILIRUBINUR, KETONESUR, PROTEINUR, UROBILINOGEN, NITRITE, LEUKOCYTESUR Sepsis Labs Invalid input(s): PROCALCITONIN,  WBC,  LACTICIDVEN Microbiology Recent Results (from the past 240 hour(s))  Respiratory Panel by RT PCR (Flu A&B, Covid) - Nasopharyngeal Swab     Status: None   Collection Time: 11/17/19  9:04 AM   Specimen: Nasopharyngeal Swab  Result Value Ref Range Status   SARS Coronavirus 2 by RT PCR NEGATIVE NEGATIVE Final    Comment: (NOTE) SARS-CoV-2 target nucleic acids are NOT DETECTED.  The SARS-CoV-2 RNA is generally detectable in upper respiratoy specimens during the acute phase of infection. The lowest concentration of SARS-CoV-2 viral copies this assay can detect is 131 copies/mL. A negative result does not preclude SARS-Cov-2 infection and should not be used as the sole basis for treatment or other patient management decisions. A negative result may occur with  improper specimen collection/handling, submission of specimen other than nasopharyngeal swab, presence of viral mutation(s) within the areas targeted by this assay, and inadequate number of viral copies (<131 copies/mL). A negative result must be combined with clinical observations, patient history, and epidemiological information. The expected result is Negative.  Fact Sheet for Patients:  https://www.moore.com/  Fact Sheet for Healthcare Providers:  https://www.young.biz/  This test is no t yet approved or cleared by the [place] FDA and  has been authorized for detection and/or diagnosis of SARS-CoV-2 by FDA under an Emergency Use Authorization (EUA). This EUA will remain  in effect (meaning this test can be used) for the duration of the COVID-19 declaration under Section 564(b)(1) of the Act, 21  U.S.C. section 360bbb-3(b)(1), unless the authorization is terminated or revoked sooner.     Influenza A by PCR NEGATIVE NEGATIVE Final   Influenza B by PCR NEGATIVE NEGATIVE Final    Comment: (NOTE) The Xpert Xpress SARS-CoV-2/FLU/RSV assay is intended as an aid in  the diagnosis of influenza from Nasopharyngeal swab specimens and  should not be used as a sole basis for treatment. Nasal washings and  aspirates are unacceptable for Xpert Xpress SARS-CoV-2/FLU/RSV  testing.  Fact Sheet for Patients: https://www.moore.com/  Fact Sheet for Healthcare Providers: https://www.young.biz/  This test is not yet approved or cleared by the [place] FDA and  has been authorized for detection and/or diagnosis of SARS-CoV-2 by  FDA under an Emergency Use Authorization (EUA). This EUA will remain  in effect (meaning this test can be used) for the duration of the  Covid-19 declaration under Section 564(b)(1) of the Act, 21  U.S.C. section 360bbb-3(b)(1), unless the authorization is  terminated or revoked. Performed at [facility], [address] [place], [place] [address]   Blood culture (routine x 2)     Status: None (Preliminary result)   Collection Time: 11/17/19 10:31 AM   Specimen: BLOOD  Result Value Ref Range Status   Specimen Description BLOOD LEFT AC  Final   Special Requests   Final    BOTTLES DRAWN AEROBIC AND ANAEROBIC Blood Culture results may not be optimal due to an excessive volume of blood received in culture bottles   Culture   Final    NO GROWTH 4 DAYS Performed at [facility], [address], [place], [place] [address]    Report Status PENDING  Incomplete  Blood culture (routine x 2)     Status: None (Preliminary result)   Collection Time: 11/17/19 10:31 AM   Specimen: BLOOD  Result Value Ref Range Status   Specimen Description BLOOD RIGHT [facility]  Final   Special Requests   Final    BOTTLES DRAWN AEROBIC  AND ANAEROBIC Blood Culture results may not be optimal due to an excessive  volume of blood received in culture bottles   Culture   Final    NO GROWTH 4 DAYS Performed at [facility], [address] [place], [place] [address]    Report Status PENDING  Incomplete  Culture, sputum-assessment     Status: None   Collection Time: 11/17/19 11:04 AM   Specimen: Sputum  Result Value Ref Range Status   Specimen Description SPUTUM  Final   Special Requests NONE  Final   Sputum evaluation   Final    THIS SPECIMEN IS ACCEPTABLE FOR SPUTUM CULTURE Performed at [facility], [address], [place], [place] [address]    Report Status 11/17/2019 FINAL  Final  Culture, respiratory     Status: None   Collection Time: 11/17/19 11:04 AM   Specimen: SPU  Result Value Ref Range Status   Specimen Description   Final    SPUTUM Performed at [facility], [address], [place], [place] [address]    Special Requests   Final    NONE Reflexed from [id number] Performed at [facility], [address] [place], [place] [address]    Gram Stain   Final    RARE WBC PRESENT, PREDOMINANTLY PMN MODERATE GRAM POSITIVE COCCI IN PAIRS IN CHAINS FEW GRAM NEGATIVE RODS RARE GRAM POSITIVE RODS    Culture   Final    ABUNDANT Normal respiratory flora-no Staph aureus or Pseudomonas seen Performed at [facility], [address] [place], [place] [address]    Report Status 11/20/2019 FINAL  Final     Time coordinating discharge: 35 minutes  SIGNED:   Jacquelin Hawking, MD [facility] 11/21/2019, 3:13 PM

## 2019-11-21 NOTE — Progress Notes (Signed)
Discharge instructions discussed with patient and spouse, both verbalized understanding. Denies questions.

## 2019-11-21 NOTE — Discharge Instructions (Signed)
Alexandra Fleming,   You were in the hospital because of a blood clot in addition to pneumonia.  She was treated with antibiotics and blood thinners.  She will go home with continued antibiotics.  With regard to your blood clot, you will go home with a blood thinner called Eliquis.  You also need to follow-up with a hematologist for your blood count in addition to your anemia.   Information on my medicine - ELIQUIS (apixaban)  This medication education was reviewed with me or my healthcare representative as part of my discharge preparation.   Why was Eliquis prescribed for you? Eliquis was prescribed for you to reduce the risk of forming blood clots that can cause a stroke if you have a medical condition called atrial fibrillation (a type of irregular heartbeat) OR to reduce the risk of a blood clots forming after orthopedic surgery.  What do You need to know about Eliquis ? Take your Eliquis TWICE DAILY - one tablet in the morning and one tablet in the evening with or without food.  It would be best to take the doses about the same time each day.  If you have difficulty swallowing the tablet whole please discuss with your pharmacist how to take the medication safely.  Take Eliquis exactly as prescribed by your doctor and DO NOT stop taking Eliquis without talking to the doctor who prescribed the medication.  Stopping may increase your risk of developing a new clot or stroke.  Refill your prescription before you run out.  After discharge, you should have regular check-up appointments with your healthcare provider that is prescribing your Eliquis.  In the future your dose may need to be changed if your kidney function or weight changes by a significant amount or as you get older.  What do you do if you miss a dose? If you miss a dose, take it as soon as you remember on the same day and resume taking twice daily.  Do not take more than one dose of ELIQUIS at the same  time.  Important Safety Information A possible side effect of Eliquis is bleeding. You should call your healthcare provider right away if you experience any of the following: ? Bleeding from an injury or your nose that does not stop. ? Unusual colored urine (red or dark brown) or unusual colored stools (red or black). ? Unusual bruising for unknown reasons. ? A serious fall or if you hit your head (even if there is no bleeding).  Some medicines may interact with Eliquis and might increase your risk of bleeding or clotting while on Eliquis. To help avoid this, consult your healthcare provider or pharmacist prior to using any new prescription or non-prescription medications, including herbals, vitamins, non-steroidal anti-inflammatory drugs (NSAIDs) and supplements.  This website has more information on Eliquis (apixaban): [email or web address].cy.

## 2019-11-21 NOTE — Evaluation (Signed)
Physical Therapy Evaluation Patient Details Name: Alexandra Fleming MRN: [id number] DOB: Jul 03, 1990 Today's Date: 11/21/2019   History of Present Illness  Pt is a 29 y.o. female presenting to hospital 9/25 with SOB, R sided chest pain, and R flank pain.  Pt admitted with acute PE and sepsis d/t possible community acquired PNA.  PMH includes h/o COVID-19 in March with Covid PNA, anxiety, IUD use.  Clinical Impression  Prior to hospital admission, pt was independent with functional mobility; lives with her husband in 1 level home with 3 STE with L railing.  Pt pleasant and appearing eager to get up and go for a walk.  Currently pt is independent with bed mobility, transfers, and ambulation 320 feet (no AD use); modified independent ambulating 3 steps with L railing.  O2 sats 90% or greater on room air during sessions activities.  HR 103-106 bpm at rest; HR increased to 120-121 bpm with ambulation and briefly 135 bpm after performing stairs.  Pt and pt's husband educated on pacing and activity modification (d/t elevated HR with exertional activity): both verbalizing appropriate understanding.  No further acute PT needs identified; will sign off.  Please re-consult PT if pt's status changes and acute PT needs are identified.    Follow Up Recommendations No PT follow up    Equipment Recommendations  None recommended by PT    Recommendations for Other Services       Precautions / Restrictions Precautions Precautions: None Restrictions Weight Bearing Restrictions: No      Mobility  Bed Mobility Overal bed mobility: Independent                Transfers Overall transfer level: Independent Equipment used: None             General transfer comment: steady safe transfers noted  Ambulation/Gait Ambulation/Gait assistance: Independent Gait Distance (Feet): 320 Feet Assistive device: None Gait Pattern/deviations: WFL(Within Functional Limits)     General Gait Details:  steady ambulation; therapist cued pt to walk a little slower to avoid elevated HR with ambulation  Stairs Stairs: Yes Stairs assistance: Modified independent (Device/Increase time) Stair Management: One rail Left;Step to pattern;Forwards Number of Stairs: 3 General stair comments: steady safe stairs navigation  Wheelchair Mobility    Modified Rankin (Stroke Patients Only)       Balance Overall balance assessment: Independent                                           Pertinent Vitals/Pain Pain Assessment: No/denies pain    Home Living Family/patient expects to be discharged to:: Private residence Living Arrangements: Spouse/significant other Available Help at Discharge: Family Type of Home: House Home Access: Stairs to enter Entrance Stairs-Rails: Left Entrance Stairs-Number of Steps: 3 Home Layout: One level Home Equipment: None      Prior Function Level of Independence: Independent         Comments: Working; (+) driving; no falls in past year     Hand Dominance        Extremity/Trunk Assessment   Upper Extremity Assessment Upper Extremity Assessment: Overall WFL for tasks assessed    Lower Extremity Assessment Lower Extremity Assessment: Overall WFL for tasks assessed    Cervical / Trunk Assessment Cervical / Trunk Assessment: Normal  Communication   Communication: No difficulties  Cognition Arousal/Alertness: Awake/alert Behavior During Therapy: WFL for tasks assessed/performed Overall Cognitive Status:  Within Functional Limits for tasks assessed                                        General Comments   Nursing cleared pt for participation in physical therapy.  Pt agreeable to PT session.  Pt's husband present during session.    Exercises     Assessment/Plan    PT Assessment Patent does not need any further PT services  PT Problem List         PT Treatment Interventions      PT Goals (Current goals  can be found in the Care Plan section)  Acute Rehab PT Goals Patient Stated Goal: to walk more and go home PT Goal Formulation: With patient Time For Goal Achievement: 12/05/19 Potential to Achieve Goals: Good    Frequency     Barriers to discharge        Co-evaluation               AM-PAC PT "6 Clicks" Mobility  Outcome Measure Help needed turning from your back to your side while in a flat bed without using bedrails?: None Help needed moving from lying on your back to sitting on the side of a flat bed without using bedrails?: None Help needed moving to and from a bed to a chair (including a wheelchair)?: None Help needed standing up from a chair using your arms (e.g., wheelchair or bedside chair)?: None Help needed to walk in hospital room?: None Help needed climbing 3-5 steps with a railing? : None 6 Click Score: 24    End of Session Equipment Utilized During Treatment: Gait belt Activity Tolerance: Patient tolerated treatment well Patient left: with call bell/phone within reach;with family/visitor present (sitting on edge of bed with pt's husband present) Nurse Communication: Mobility status;Precautions;Other (comment) (pt's HR and O2 vitals during session) PT Visit Diagnosis: Other abnormalities of gait and mobility (R26.89)    Time: 1113-1150 PT Time Calculation (min) (ACUTE ONLY): 37 min   Charges:   PT Evaluation $PT Eval Low Complexity: 1 Low PT Treatments $Therapeutic Exercise: 8-22 mins $Therapeutic Activity: 8-22 mins       Hendricks Limes, PT 11/21/19, 12:53 PM

## 2019-11-21 NOTE — Telephone Encounter (Signed)
Labs entered.

## 2019-11-21 NOTE — Addendum Note (Signed)
Addended by: Mercer Pod E on: 11/21/2019 03:53 PM   Modules accepted: Orders

## 2019-11-21 NOTE — Telephone Encounter (Signed)
On 9/29-discussed with the hospitalist regarding new onset of acute PE/post Covid lupus anticoagulant antibodies positive.  In general would recommend Lovenox; Coumadin therapeutic.  As per the hospitalist-patient declines staying in hospital.  Patient currently on Eliquis clinically stable.  M-schedule follow-up/NEW Pt- in the clinic next week-MD; labs- cbc/cmp.  Thanks GB

## 2019-11-22 ENCOUNTER — Telehealth: Payer: Self-pay

## 2019-11-22 LAB — TYPE AND SCREEN
ABO/RH(D): B POS
Antibody Screen: POSITIVE
Unit division: 0
Unit division: 0
Unit division: 0

## 2019-11-22 LAB — BPAM RBC
Blood Product Expiration Date: 202110212359
Blood Product Expiration Date: 202110212359
Blood Product Expiration Date: 202110282359
ISSUE DATE / TIME: 202109271753
Unit Type and Rh: 5100
Unit Type and Rh: 7300
Unit Type and Rh: 7300

## 2019-11-22 LAB — CULTURE, BLOOD (ROUTINE X 2)
Culture: NO GROWTH
Culture: NO GROWTH

## 2019-11-22 LAB — PTT-LA MIX: PTT-LA Mix: 61.5 s — ABNORMAL HIGH (ref 0.0–48.9)

## 2019-11-22 LAB — HEXAGONAL PHASE PHOSPHOLIPID: Hexagonal Phase Phospholipid: 19 s — ABNORMAL HIGH (ref 0–11)

## 2019-11-22 LAB — DRVVT MIX: dRVVT Mix: 58.2 s — ABNORMAL HIGH (ref 0.0–40.4)

## 2019-11-22 LAB — LUPUS ANTICOAGULANT PANEL
DRVVT: 62.4 s — ABNORMAL HIGH (ref 0.0–47.0)
PTT Lupus Anticoagulant: 59.4 s — ABNORMAL HIGH (ref 0.0–51.9)

## 2019-11-22 LAB — DRVVT CONFIRM: dRVVT Confirm: 1.5 ratio — ABNORMAL HIGH (ref 0.8–1.2)

## 2019-11-22 LAB — PROTHROMBIN GENE MUTATION

## 2019-11-22 NOTE — Telephone Encounter (Signed)
°  Patient was recently discharged from the hospital on 11/21/19.  No TCM completed, patient does not qualify for TCM services due to [employer] coverage.  Per discharge summary patient needs follow up with PCP. HFU scheduled for 11/28/19 @ 1:20 PM with PCP. Lorain Childes!

## 2019-11-28 ENCOUNTER — Ambulatory Visit: Payer: BC Managed Care – PPO | Admitting: Physician Assistant

## 2019-11-28 ENCOUNTER — Encounter: Payer: Self-pay | Admitting: Physician Assistant

## 2019-11-28 ENCOUNTER — Other Ambulatory Visit: Payer: Self-pay

## 2019-11-28 VITALS — BP 122/85 | HR 113 | Resp 20 | Ht 66.0 in | Wt 266.2 lb

## 2019-11-28 DIAGNOSIS — J189 Pneumonia, unspecified organism: Secondary | ICD-10-CM

## 2019-11-28 DIAGNOSIS — R29818 Other symptoms and signs involving the nervous system: Secondary | ICD-10-CM

## 2019-11-28 DIAGNOSIS — I2693 Single subsegmental pulmonary embolism without acute cor pulmonale: Secondary | ICD-10-CM

## 2019-11-28 DIAGNOSIS — D649 Anemia, unspecified: Secondary | ICD-10-CM

## 2019-11-28 NOTE — Progress Notes (Signed)
Established patient visit   Patient: Alexandra Fleming   DOB: 03-21-1990   29 y.o. Female  MRN: [id number] Visit Date: 11/28/2019  Today's healthcare provider: Trey Sailors, PA-C   Chief Complaint  Patient presents with  . 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   Follow up Hospitalization  Patient was admitted to [facility] on 11/18/2019 and discharged on 11/21/2019. She had severe chest pain on 11/17/2019.  She was treated for CAP of her RLL and also pulmonary embolism Treatment for this included azthromycin 250 mg 6 day course,  levofloxacin 750mg  x 5 days, heparin and ultimately eliquis.  Telephone follow up was done on 11/22/2019 She reports good compliance with treatment. During hospitalization, her course was complicated by anemia requiring blood transfusion.  She reports this condition is improved. However, patient reports that she is still feeling very fatigued and a hacking cough. She reports some mental fog. She reports completing her course of Levaquin and remains on Eliquis. She denies fevers, chills. She reports decreased endurance. She has an upcoming appointment on 11/30/2019 with the hematology/oncology clinic   Patient also reports during her hospitalization she was told her oxygen drops at night. Husband also reports she stops breathing at night and appears like she is choking.   Pulse Readings from Last 4 Encounters:  11/28/19 (!) 113  11/21/19 100  07/19/19 92  03/12/19 92        Medications: Outpatient Medications Prior to Visit  Medication Sig  . APIXABAN (ELIQUIS) VTE STARTER PACK (10MG  AND 5MG ) Take as directed on package: start with two-5mg  tablets twice daily for 7 days. On day 8, switch to one-5mg  tablet twice daily.  03/14/19 escitalopram (LEXAPRO) 20 MG tablet TAKE 1 TABLET(20 MG) BY MOUTH DAILY  . levonorgestrel (MIRENA) 20 MCG/24HR IUD 1 each by  Intrauterine route once.  losartan-hydrochlorothiazide (HYZAAR) 50-12.5 MG tablet TAKE 1 TABLET BY MOUTH DAILY   No facility-administered medications prior to visit.    Review of Systems  Constitutional: Positive for activity change and fatigue.  Respiratory: Positive for cough and shortness of breath.   Cardiovascular: Negative for chest pain, palpitations and leg swelling.  Neurological: Positive for headaches.      Objective    BP 122/85   Pulse (!) 113   Resp 20   Ht 5\' 6"  (1.676 m)   Wt 266 lb 3.2 oz (120.7 kg)   SpO2 98%   BMI 42.97 kg/m    Physical Exam Constitutional:      Appearance: Normal appearance.  Cardiovascular:     Rate and Rhythm: Normal rate and regular rhythm.     Heart sounds: Normal heart sounds.  Pulmonary:     Effort: Pulmonary effort is normal.     Breath sounds: Normal breath sounds.  Skin:    General: Skin is warm and dry.  Neurological:     Mental Status: She is alert and oriented to person, place, and time. Mental status is at baseline.  Psychiatric:        Mood and Affect: Mood normal.        Behavior: Behavior normal.       No results found for any visits on 11/28/19.  Assessment & Plan    1. Anemia, unspecified type  Suspect she may need additional labs with oncology and have deferred labs today so she might get them checked at once  on 11/30/2019/   2. Community acquired pneumonia of right lower lobe of lung  Completed abx. CXR in 6 weeks to assess resolution.  - DG Chest 2 View; Future  3. Single subsegmental pulmonary embolism without acute cor pulmonale (HCC)  She is following up with hematology, currently on Eliquis.    4. Suspected sleep apnea   - Home sleep test    Return if symptoms worsen or fail to improve.      ITrey Sailors, PA-C, have reviewed all documentation for this visit. The documentation on 11/28/19 for the exam, diagnosis, procedures, and orders are all accurate and complete.  The  entirety of the information documented in the History of Present Illness, Review of Systems and Physical Exam were personally obtained by me. Portions of this information were initially documented by Anson Oregon, CMA and reviewed by me for thoroughness and accuracy.     Maryella Shivers  [facility] [phone] (phone) [phone] (fax)  [facility]

## 2019-11-28 NOTE — Patient Instructions (Signed)
Community-Acquired Pneumonia, Adult Pneumonia is an infection of the lungs. It causes swelling in the airways of the lungs. Mucus and fluid may also build up inside the airways. One type of pneumonia can happen while a person is in a hospital. A different type can happen when a person is not in a hospital (community-acquired pneumonia).  What are the causes?  This condition is caused by germs (viruses, bacteria, or fungi). Some types of germs can be passed from one person to another. This can happen when you breathe in droplets from the cough or sneeze of an infected person. What increases the risk? You are more likely to develop this condition if you:  Have a long-term (chronic) disease, such as: ? Chronic obstructive pulmonary disease (COPD). ? Asthma. ? Cystic fibrosis. ? Congestive heart failure. ? Diabetes. ? Kidney disease.  Have HIV.  Have sickle cell disease.  Have had your spleen removed.  Do not take good care of your teeth and mouth (poor dental hygiene).  Have a medical condition that increases the risk of breathing in droplets from your own mouth and nose.  Have a weakened body defense system (immune system).  Are a smoker.  Travel to areas where the germs that cause this illness are common.  Are around certain animals or the places they live. What are the signs or symptoms?  A dry cough.  A wet (productive) cough.  Fever.  Sweating.  Chest pain. This often happens when breathing deeply or coughing.  Fast breathing or trouble breathing.  Shortness of breath.  Shaking chills.  Feeling tired (fatigue).  Muscle aches. How is this treated? Treatment for this condition depends on many things. Most adults can be treated at home. In some cases, treatment must happen in a hospital. Treatment may include:  Medicines given by mouth or through an IV tube.  Being given extra oxygen.  Respiratory therapy. In rare cases, treatment for very bad pneumonia  may include:  Using a machine to help you breathe.  Having a procedure to remove fluid from around your lungs. Follow these instructions at home: Medicines  Take over-the-counter and prescription medicines only as told by your doctor. ? Only take cough medicine if you are losing sleep.  If you were prescribed an antibiotic medicine, take it as told by your doctor. Do not stop taking the antibiotic even if you start to feel better. General instructions   Sleep with your head and neck raised (elevated). You can do this by sleeping in a recliner or by putting a few pillows under your head.  Rest as needed. Get at least 8 hours of sleep each night.  Drink enough water to keep your pee (urine) pale yellow.  Eat a healthy diet that includes plenty of vegetables, fruits, whole grains, low-fat dairy products, and lean protein.  Do not use any products that contain nicotine or tobacco. These include cigarettes, e-cigarettes, and chewing tobacco. If you need help quitting, ask your doctor.  Keep all follow-up visits as told by your doctor. This is important. How is this prevented? A shot (vaccine) can help prevent pneumonia. Shots are often suggested for:  People older than 31 years of age.  People older than 31 years of age who: ? Are having cancer treatment. ? Have long-term (chronic) lung disease. ? Have problems with their body's defense system. You may also prevent pneumonia if you take these actions:  Get the flu (influenza) shot every year.  Go to the dentist as   often as told.  Wash your hands often. If you cannot use soap and water, use hand sanitizer. Contact a doctor if:  You have a fever.  You lose sleep because your cough medicine does not help. Get help right away if:  You are short of breath and it gets worse.  You have more chest pain.  Your sickness gets worse. This is very serious if: ? You are an older adult. ? Your body's defense system is weak.  You  cough up blood. Summary  Pneumonia is an infection of the lungs.  Most adults can be treated at home. Some will need treatment in a hospital.  Drink enough water to keep your pee pale yellow.  Get at least 8 hours of sleep each night. This information is not intended to replace advice given to you by your health care provider. Make sure you discuss any questions you have with your health care provider. Document Revised: 05/31/2018 Document Reviewed: 10/06/2017 Elsevier Patient Education  2020 [employer]  

## 2019-11-30 ENCOUNTER — Encounter: Payer: Self-pay | Admitting: Internal Medicine

## 2019-11-30 ENCOUNTER — Inpatient Hospital Stay: Payer: BC Managed Care – PPO

## 2019-11-30 ENCOUNTER — Other Ambulatory Visit: Payer: Self-pay

## 2019-11-30 ENCOUNTER — Inpatient Hospital Stay: Payer: BC Managed Care – PPO | Attending: Internal Medicine | Admitting: Internal Medicine

## 2019-11-30 DIAGNOSIS — Z8616 Personal history of COVID-19: Secondary | ICD-10-CM | POA: Diagnosis not present

## 2019-11-30 DIAGNOSIS — R5381 Other malaise: Secondary | ICD-10-CM | POA: Diagnosis not present

## 2019-11-30 DIAGNOSIS — I2699 Other pulmonary embolism without acute cor pulmonale: Secondary | ICD-10-CM | POA: Diagnosis not present

## 2019-11-30 DIAGNOSIS — R110 Nausea: Secondary | ICD-10-CM | POA: Insufficient documentation

## 2019-11-30 DIAGNOSIS — D509 Iron deficiency anemia, unspecified: Secondary | ICD-10-CM | POA: Insufficient documentation

## 2019-11-30 DIAGNOSIS — R5383 Other fatigue: Secondary | ICD-10-CM | POA: Diagnosis not present

## 2019-11-30 DIAGNOSIS — D649 Anemia, unspecified: Secondary | ICD-10-CM

## 2019-11-30 DIAGNOSIS — R0602 Shortness of breath: Secondary | ICD-10-CM | POA: Insufficient documentation

## 2019-11-30 DIAGNOSIS — Z7901 Long term (current) use of anticoagulants: Secondary | ICD-10-CM | POA: Insufficient documentation

## 2019-11-30 DIAGNOSIS — R760 Raised antibody titer: Secondary | ICD-10-CM | POA: Insufficient documentation

## 2019-11-30 LAB — CBC WITH DIFFERENTIAL/PLATELET
Abs Immature Granulocytes: 0.14 10*3/uL — ABNORMAL HIGH (ref 0.00–0.07)
Basophils Absolute: 0 10*3/uL (ref 0.0–0.1)
Basophils Relative: 0 %
Eosinophils Absolute: 0.3 10*3/uL (ref 0.0–0.5)
Eosinophils Relative: 3 %
HCT: 29.4 % — ABNORMAL LOW (ref 36.0–46.0)
Hemoglobin: 9 g/dL — ABNORMAL LOW (ref 12.0–15.0)
Immature Granulocytes: 2 %
Lymphocytes Relative: 21 %
Lymphs Abs: 1.9 10*3/uL (ref 0.7–4.0)
MCH: 22.7 pg — ABNORMAL LOW (ref 26.0–34.0)
MCHC: 30.6 g/dL (ref 30.0–36.0)
MCV: 74.2 fL — ABNORMAL LOW (ref 80.0–100.0)
Monocytes Absolute: 0.6 10*3/uL (ref 0.1–1.0)
Monocytes Relative: 6 %
Neutro Abs: 6 10*3/uL (ref 1.7–7.7)
Neutrophils Relative %: 68 %
Platelets: 276 10*3/uL (ref 150–400)
RBC: 3.96 MIL/uL (ref 3.87–5.11)
RDW: 16.7 % — ABNORMAL HIGH (ref 11.5–15.5)
WBC: 9 10*3/uL (ref 4.0–10.5)
nRBC: 0 % (ref 0.0–0.2)

## 2019-11-30 LAB — COMPREHENSIVE METABOLIC PANEL
ALT: 21 U/L (ref 0–44)
AST: 17 U/L (ref 15–41)
Albumin: 3.5 g/dL (ref 3.5–5.0)
Alkaline Phosphatase: 59 U/L (ref 38–126)
Anion gap: 9 (ref 5–15)
BUN: 15 mg/dL (ref 6–20)
CO2: 27 mmol/L (ref 22–32)
Calcium: 8.6 mg/dL — ABNORMAL LOW (ref 8.9–10.3)
Chloride: 102 mmol/L (ref 98–111)
Creatinine, Ser: 0.82 mg/dL (ref 0.44–1.00)
GFR, Estimated: >60 mL/min (ref >60)
Glucose, Bld: 114 mg/dL — ABNORMAL HIGH (ref 70–99)
Potassium: 3.9 mmol/L (ref 3.5–5.1)
Sodium: 138 mmol/L (ref 135–145)
Total Bilirubin: 0.4 mg/dL (ref 0.3–1.2)
Total Protein: 7.6 g/dL (ref 6.5–8.1)

## 2019-11-30 LAB — FERRITIN: Ferritin: 119 ng/mL (ref 11–307)

## 2019-11-30 LAB — RETICULOCYTES
Immature Retic Fract: 30.5 % — ABNORMAL HIGH (ref 2.3–15.9)
RBC.: 3.97 MIL/uL (ref 3.87–5.11)
Retic Count, Absolute: 110 10*3/uL (ref 19.0–186.0)
Retic Ct Pct: 2.8 % (ref 0.4–3.1)

## 2019-11-30 LAB — VITAMIN B12: Vitamin B-12: 251 pg/mL (ref 180–914)

## 2019-11-30 LAB — LACTATE DEHYDROGENASE: LDH: 165 U/L (ref 98–192)

## 2019-11-30 LAB — C-REACTIVE PROTEIN: CRP: 5.2 mg/dL — ABNORMAL HIGH (ref <1.0)

## 2019-11-30 LAB — FOLATE: Folate: 10.5 ng/mL (ref >5.9)

## 2019-11-30 NOTE — Progress Notes (Signed)
[facility] CONSULT NOTE  Patient Care Team: Maryella Shivers as PCP - General (Physician Assistant) Wenda Overland, LCSW as [facility] Care Management (Licensed Clinical Social Worker)  CHIEF COMPLAINTS/PURPOSE OF CONSULTATION: DVT/PE  # SEP 25th, 2021- Acute pulmonary embolism in posterior intralobar branches of right lower lobe pulmonary artery; Right lower lobe airspace disease-pneumonia or pulmonary infarct. Mild infiltrate or atelectasis in posterior left lower lobe; PRIOT O HEPARIN [drawn in ER ] APTT- 45; lupus anticoagulant/DRVVT- POSITIVE; Anticardiolipin IgG-HIGHE; IgM- indeterminate; Beta2 GP- IgG- HIGH; PTT-LA- POSITIVE; HEXAGONALPHASE- POSITIVE  # ANEMIA- Hb 7.8 [sep 2021]; iron saturation 2% ferritin-77 s/p PRBC transfusion; Venofer  # COVID-19 [Jan 2021; NO mabs therapy] Oncology History   No history exists.     HISTORY OF PRESENTING ILLNESS:  Alexandra Fleming 29 y.o.  female with a history of newly diagnosed PE right lower lobe; right lower lobe pneumonia is here for initial consultation.   Patient was diagnosed with Covid-19-end of January February 2021. Patient did not need hospitalization; or monoclonal therapy. Patient is currently vaccinated to Covid.  Patient was recently admitted to hospital for worsening shortness of breath chest pain pleuritic. As noted on the CTA above-right lobar PE; pneumonia. Hypercoagulable work-up initiated-positive for antiphospholipid antibody syndrome/DRVVT. Patient noted to have anemia during PRBC transfusion. Patient also treated with antibiotics. Patient was initially treated with IV heparin; discharge on Eliquis.  Patient denies any swelling in the legs. Denies any blood in stools or black stools. Intermittent right chest wall pain/pleuritic pain. Not any worse.  With regards risk factors: Long distance travel- none Immobilization/trauma: none Previous history of DVT/PE: none Family  history: none Birth control pills: IUD/mirena [3- years]; Hx of heavy menstrual cycles prior; no blood in stools or urine.   Review of Systems  Constitutional: Positive for malaise/fatigue. Negative for chills, diaphoresis, fever and weight loss.  HENT: Negative for nosebleeds and sore throat.   Eyes: Negative for double vision.  Respiratory: Positive for shortness of breath. Negative for cough, hemoptysis, sputum production and wheezing.   Cardiovascular: Negative for chest pain, palpitations, orthopnea and leg swelling.  Gastrointestinal: Positive for nausea. Negative for abdominal pain, blood in stool, constipation, diarrhea, heartburn, melena and vomiting.  Genitourinary: Negative for dysuria, frequency and urgency.  Musculoskeletal: Negative for back pain and joint pain.  Skin: Negative.  Negative for itching and rash.  Neurological: Negative for dizziness, tingling, focal weakness, weakness and headaches.  Endo/Heme/Allergies: Does not bruise/bleed easily.  Psychiatric/Behavioral: Negative for depression. The patient is not nervous/anxious and does not have insomnia.      MEDICAL HISTORY:  Past Medical History:  Diagnosis Date  . COVID-19 virus infection   . Depression   . HTN (hypertension)     SURGICAL HISTORY: Past Surgical History:  Procedure Laterality Date  . WISDOM TOOTH EXTRACTION      SOCIAL HISTORY: Social History   Socioeconomic History  . Marital status: Married    Spouse name: Not on file  . Number of children: Not on file  . Years of education: Not on file  . Highest education level: Not on file  Occupational History  . Not on file  Tobacco Use  . Smoking status: Never Smoker  . Smokeless tobacco: Never Used  Substance and Sexual Activity  . Alcohol use: Yes    Alcohol/week: 6.0 standard drinks    Types: 2 Glasses of wine, 4 Cans of beer per week  . Drug use: No  . Sexual  activity: Not on file  Other Topics Concern  . Not on file  Social  History Narrative   No smoking; ocassional alcohol. Used to be Web designerelementary music teacher; MicrobiologistUNC- administrative assistant. Live sin graham/husband.    Social Determinants of Health   Financial Resource Strain: Medium Risk  . Difficulty of Paying Living Expenses: Somewhat hard  Food Insecurity: No Food Insecurity  . Worried About Programme researcher, broadcasting/film/videounning Out of Food in the Last Year: Never true  . Ran Out of Food in the Last Year: Never true  Transportation Needs: No Transportation Needs  . Lack of Transportation (Medical): No  . Lack of Transportation (Non-Medical): No  Physical Activity: Inactive  . Days of Exercise per Week: 0 days  . Minutes of Exercise per Session: 0 min  Stress:   . Feeling of Stress : Not on file  Social Connections: Moderately Isolated  . Frequency of Communication with Friends and Family: More than three times a week  . Frequency of Social Gatherings with Friends and Family: More than three times a week  . Attends Religious Services: Never  . Active Member of Clubs or Organizations: No  . Attends BankerClub or Organization Meetings: Never  . Marital Status: Married  Catering managerntimate Partner Violence:   . Fear of Current or Ex-Partner: Not on file  . Emotionally Abused: Not on file  . Physically Abused: Not on file  . Sexually Abused: Not on file    FAMILY HISTORY: Family History  Problem Relation Age of Onset  . Allergies Mother   . Breast cancer Mother 246       DCIS  . High Cholesterol Father   . Hypertension Father   . Sleep apnea Father   . Diabetes Maternal Grandmother   . Hypertension Maternal Grandmother   . COPD Maternal Grandfather   . Thyroid disease Paternal Grandmother   . Hypertension Paternal Grandmother   . Hyperlipidemia Paternal Grandmother   . Sleep apnea Paternal Grandfather   . Hypertension Paternal Grandfather   . Hyperlipidemia Paternal Grandfather   . Hypertension Paternal Aunt   . Anxiety disorder Paternal Aunt   . Hypertension Maternal Uncle      ALLERGIES:  is allergic to amoxicillin and penicillins.  MEDICATIONS:  Current Outpatient Medications  Medication Sig Dispense Refill  . APIXABAN (ELIQUIS) VTE STARTER PACK (10MG  AND 5MG ) Take as directed on package: start with two-5mg  tablets twice daily for 7 days. On day 8, switch to one-5mg  tablet twice daily. 1 each 0  . escitalopram (LEXAPRO) 20 MG tablet TAKE 1 TABLET(20 MG) BY MOUTH DAILY 90 tablet 0  . hydrOXYzine (ATARAX/VISTARIL) 10 MG tablet Take 10 mg by mouth 3 (three) times daily as needed.    Marland Kitchen. levonorgestrel (MIRENA) 20 MCG/24HR IUD 1 each by Intrauterine route once.    Marland Kitchen. losartan-hydrochlorothiazide (HYZAAR) 50-12.5 MG tablet TAKE 1 TABLET BY MOUTH DAILY 90 tablet 0   No current facility-administered medications for this visit.      Marland Kitchen.  PHYSICAL EXAMINATION:  Vitals:   11/30/19 1103  BP: 118/83  Pulse: 92  Resp: 18  Temp: 98.8 F (37.1 C)  SpO2: 97%   Filed Weights   11/30/19 1103  Weight: 264 lb 12.8 oz (120.1 kg)    Physical Exam Constitutional:      Comments: Patient walk independently. Accompanied by her husband. No acute distress.  HENT:     Head: Normocephalic and atraumatic.     Mouth/Throat:     Pharynx: No oropharyngeal exudate.  Eyes:  Pupils: Pupils are equal, round, and reactive to light.  Cardiovascular:     Rate and Rhythm: Normal rate and regular rhythm.  Pulmonary:     Effort: Pulmonary effort is normal. No respiratory distress.     Breath sounds: Normal breath sounds. No wheezing.  Abdominal:     General: Bowel sounds are normal. There is no distension.     Palpations: Abdomen is soft. There is no mass.     Tenderness: There is no abdominal tenderness. There is no guarding or rebound.  Musculoskeletal:        General: No tenderness. Normal range of motion.     Cervical back: Normal range of motion and neck supple.  Skin:    General: Skin is warm.  Neurological:     Mental Status: She is alert and oriented to person,  place, and time.  Psychiatric:        Mood and Affect: Affect normal.      LABORATORY DATA:  I have reviewed the data as listed Lab Results  Component Value Date   WBC 9.0 11/30/2019   HGB 9.0 (L) 11/30/2019   HCT 29.4 (L) 11/30/2019   MCV 74.2 (L) 11/30/2019   PLT 276 11/30/2019   Recent Labs    07/19/19 1106 11/17/19 0209 11/19/19 0211 11/20/19 0457 11/21/19 0343  NA 136   < > 138 135 138  K 4.3   < > 3.6 3.7 3.7  CL 101   < > 105 103 101  CO2 20   < > GLUCOSE 90   < > 123* 114* 114*  BUN 16   < > CREATININE 0.86   < > 0.91 0.76 0.67  CALCIUM 9.6   < > 7.7* 7.9* 8.3*  GFRNONAA 91   < > >60 >60 >60  GFRAA 105   < > >60 >60 >60  PROT 7.2  --   --   --   --   ALBUMIN 4.3  --   --   --   --   AST 15  --   --   --   --   ALT 12  --   --   --   --   ALKPHOS 87  --   --   --   --   BILITOT 0.4  --   --   --   --    < > = values in this interval not displayed.    RADIOGRAPHIC STUDIES: I have personally reviewed the radiological images as listed and agreed with the findings in the report. DG Chest 2 View  Result Date: 11/17/2019 CLINICAL DATA:  Shortness of breath, right-sided chest tightness, right shoulder and clavicular pain radiating to the right arm EXAM: CHEST - 2 VIEW COMPARISON:  Radiograph 04/24/2019 FINDINGS: Patchy consolidative opacities are present in the right lower lobe and lingula with associated airways thickening. Additional hazy opacities may be present in the left lung base as well. There is bilateral streaky atelectatic changes including more pronounced bandlike atelectasis in the right lung base with asymmetric elevation of the right hemidiaphragm. The cardiomediastinal contours are unremarkable. No acute osseous or soft tissue abnormality. IMPRESSION: 1. Patchy consolidative opacities lower lungs with associated airways thickening could reflect a developing pneumonia in the appropriate clinical setting. A more masslike appearance is  noted on the frontal radiograph in the periphery of the right lower lobe could reflect some more focal consolidation though could  consider evaluation with CT or follow-up imaging to resolution. Electronically Signed   By: Kreg Shropshire M.D.   On: 11/17/2019 02:32   CT Angio Chest PE W and/or Wo Contrast  Result Date: 11/17/2019 CLINICAL DATA:  Right-sided chest pain and shortness of breath. High probability for pulmonary embolism. EXAM: CT ANGIOGRAPHY CHEST WITH CONTRAST TECHNIQUE: Multidetector CT imaging of the chest was performed using the standard protocol during bolus administration of intravenous contrast. Multiplanar CT image reconstructions and MIPs were obtained to evaluate the vascular anatomy. CONTRAST:  OMNIPAQUE IOHEXOL 350 MG/ML SOLN COMPARISON:  None. FINDINGS: Cardiovascular: Pulmonary embolism is seen in posterior intralobar branches of the right lower lobe pulmonary artery. No left-sided pulmonary emboli identified. Mediastinum/Nodes: No pathologically enlarged lymph nodes or other masses identified. Lungs/Pleura: Airspace disease is seen in the right lower lobe, which could be due to pneumonia or infarct. Mild infiltrate or atelectasis is also seen in the posterior left lower lobe. No evidence of pleural effusion. Upper abdomen: No acute findings. Musculoskeletal: No suspicious bone lesions identified. Review of the MIP images confirms the above findings. IMPRESSION: Acute pulmonary embolism in posterior intralobar branches of right lower lobe pulmonary artery. Right lower lobe airspace disease, which could be due to pneumonia or pulmonary infarct. Mild infiltrate or atelectasis in posterior left lower lobe. Critical Value/emergent results were called by telephone at the time of interpretation on 11/17/2019 at 9:53 am to provider DR. Jene Every , who verbally acknowledged these results. Electronically Signed   By: Danae Orleans M.D.   On: 11/17/2019 09:57   US Venous Img Lower  Bilateral (DVT)  Result Date: 11/17/2019 CLINICAL DATA:  29 year old female with history of pulmonary embolism and shortness of breath. EXAM: BILATERAL LOWER EXTREMITY VENOUS DOPPLER ULTRASOUND TECHNIQUE: Gray-scale sonography with graded compression, as well as color Doppler and duplex ultrasound were performed to evaluate the lower extremity deep venous systems from the level of the common femoral vein and including the common femoral, femoral, profunda femoral, popliteal and calf veins including the posterior tibial, peroneal and gastrocnemius veins when visible. The superficial great saphenous vein was also interrogated. Spectral Doppler was utilized to evaluate flow at rest and with distal augmentation maneuvers in the common femoral, femoral and popliteal veins. COMPARISON:  None. FINDINGS: RIGHT LOWER EXTREMITY Common Femoral Vein: No evidence of thrombus. Normal compressibility, respiratory phasicity and response to augmentation. Saphenofemoral Junction: No evidence of thrombus. Normal compressibility and flow on color Doppler imaging. Profunda Femoral Vein: No evidence of thrombus. Normal compressibility and flow on color Doppler imaging. Femoral Vein: No evidence of thrombus. Normal compressibility, respiratory phasicity and response to augmentation. Popliteal Vein: No evidence of thrombus. Normal compressibility, respiratory phasicity and response to augmentation. Calf Veins: No evidence of thrombus. Normal compressibility and flow on color Doppler imaging. Superficial Great Saphenous Vein: No evidence of thrombus. Normal compressibility. Other Findings:  None. LEFT LOWER EXTREMITY Common Femoral Vein: No evidence of thrombus. Normal compressibility, respiratory phasicity and response to augmentation. Saphenofemoral Junction: No evidence of thrombus. Normal compressibility and flow on color Doppler imaging. Profunda Femoral Vein: No evidence of thrombus. Normal compressibility and flow on color Doppler  imaging. Femoral Vein: No evidence of thrombus. Normal compressibility, respiratory phasicity and response to augmentation. Popliteal Vein: No evidence of thrombus. Normal compressibility, respiratory phasicity and response to augmentation. Calf Veins: No evidence of thrombus. Normal compressibility and flow on color Doppler imaging. Superficial Great Saphenous Vein: No evidence of thrombus. Normal compressibility. Other Findings:  None. IMPRESSION: No evidence  of bilateral lower extremity deep venous thrombosis. Electronically Signed   By: Marliss Coots MD   On: 11/17/2019 11:39   DG Chest Port 1 View  Result Date: 11/18/2019 CLINICAL DATA:  COVID and acute pulmonary embolism. EXAM: PORTABLE CHEST 1 VIEW COMPARISON:  11/17/2019. FINDINGS: Increasing bilateral LOWER lung airspace opacities are noted. The cardiomediastinal silhouette is unchanged. A probable small RIGHT pleural effusion is noted. There is no evidence of pneumothorax or acute bony abnormality. IMPRESSION: Increasing bilateral LOWER lung airspace opacities/pneumonia. Probable small RIGHT pleural effusion. Electronically Signed   By: Harmon Pier M.D.   On: 11/18/2019 09:24   ECHOCARDIOGRAM COMPLETE  Result Date: 11/18/2019    ECHOCARDIOGRAM REPORT   Patient Name:   Alexandra Fleming Date of Exam: 11/18/2019 Medical Rec #:  [id number]                Height:       66.0 in Accession #:    [id number]               Weight:       264.6 lb Date of Birth:  December 25, 1990               BSA:          2.252 m Patient Age:    28 years                 BP:           130/77 mmHg Patient Gender: F                        HR:           105 bpm. Exam Location:  [facility] Procedure: 2D Echo Indications:     Pulmonary Embolus 415.19 / I26.99  History:         Patient has no prior history of Echocardiogram examinations.                  Risk Factors:Hypertension. Sepsis.  Sonographer:     Johnathan Hausen Referring Phys:  [id number] Charise Killian Diagnosing Phys:  Adrian Blackwater MD IMPRESSIONS  1. Left ventricular ejection fraction, by estimation, is 55 to 60%. The left ventricle has normal function. The left ventricle has no regional wall motion abnormalities. Left ventricular diastolic parameters are consistent with Grade I diastolic dysfunction (impaired relaxation).  2. Right ventricular systolic function is normal. The right ventricular size is normal.  3. The mitral valve is normal in structure. Trivial mitral valve regurgitation. No evidence of mitral stenosis.  4. The aortic valve is normal in structure. Aortic valve regurgitation is not visualized. No aortic stenosis is present.  5. The inferior vena cava is normal in size with greater than 50% respiratory variability, suggesting right atrial pressure of 3 mmHg. FINDINGS  Left Ventricle: Left ventricular ejection fraction, by estimation, is 55 to 60%. The left ventricle has normal function. The left ventricle has no regional wall motion abnormalities. The left ventricular internal cavity size was normal in size. There is  no left ventricular hypertrophy. Left ventricular diastolic parameters are consistent with Grade I diastolic dysfunction (impaired relaxation). Right Ventricle: The right ventricular size is normal. No increase in right ventricular wall thickness. Right ventricular systolic function is normal. Left Atrium: Left atrial size was normal in size. Right Atrium: Right atrial size was normal in size. Pericardium: There is no evidence of pericardial effusion. Mitral Valve: The mitral valve is  normal in structure. Trivial mitral valve regurgitation. No evidence of mitral valve stenosis. Tricuspid Valve: The tricuspid valve is normal in structure. Tricuspid valve regurgitation is trivial. No evidence of tricuspid stenosis. Aortic Valve: The aortic valve is normal in structure. Aortic valve regurgitation is not visualized. No aortic stenosis is present. Pulmonic Valve: The pulmonic valve was normal in structure.  Pulmonic valve regurgitation is trivial. No evidence of pulmonic stenosis. Aorta: The aortic root is normal in size and structure. Venous: The inferior vena cava is normal in size with greater than 50% respiratory variability, suggesting right atrial pressure of 3 mmHg. IAS/Shunts: No atrial level shunt detected by color flow Doppler.  LEFT VENTRICLE PLAX 2D LVIDd:         3.89 cm  Diastology LVIDs:         2.67 cm  LV e' medial:    10.30 cm/s LV PW:         0.92 cm  LV E/e' medial:  10.3 LV IVS:        1.26 cm  LV e' lateral:   18.70 cm/s LVOT diam:     1.90 cm  LV E/e' lateral: 5.7 LVOT Area:     2.84 cm  RIGHT VENTRICLE             IVC RV S prime:     17.60 cm/s  IVC diam: 2.02 cm TAPSE (M-mode): 2.7 cm LEFT ATRIUM             Index       RIGHT ATRIUM           Index LA diam:        4.00 cm 1.78 cm/m  RA Area:     15.90 cm LA Vol (A2C):   61.8 ml 27.44 ml/m RA Volume:   41.90 ml  18.60 ml/m LA Vol (A4C):   43.5 ml 19.31 ml/m LA Biplane Vol: 52.6 ml 23.35 ml/m   AORTA Ao Root diam: 2.90 cm MITRAL VALVE                TRICUSPID VALVE MV Area (PHT): 4.83 cm     TR Peak grad:   31.6 mmHg MV Decel Time: 157 msec     TR Vmax:        281.00 cm/s MV E velocity: 106.00 cm/s MV A velocity: 92.40 cm/s   SHUNTS MV E/A ratio:  1.15         Systemic Diam: 1.90 cm Adrian Blackwater MD Electronically signed by Adrian Blackwater MD Signature Date/Time: 11/18/2019/2:17:17 PM    Final     ASSESSMENT & PLAN:   Pulmonary embolism on right [facility]) #Pulmonary embolism right lobar arteries- [sep 25th, 2021]-etiology likely post Covid/lupus antibodies see discussion below.  I would recommend anticoagulation for at least 3 months.  Patient tolerating Eliquis fairly well continue Eliquis.   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 Will repeat blood work in 12 weeks.  #Severe iron deficient anemia-hemoglobin 7.6; iron saturation low;Ferritin- eleavted- ? Etiology- will repeat labs today. Recommend iron  infusions if iron studies are low.   Thank you for allowing me to participate in the care of your pleasant patient. Please do not hesitate to contact me with questions or concerns in the interim.  # DISPOSITION: # labs today-ordered # follow up TBD-Dr.B  All questions were answered. The patient knows to call the clinic with any problems, questions or concerns.    Earna Coder, MD 11/30/2019 12:32 PM

## 2019-11-30 NOTE — Assessment & Plan Note (Addendum)
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  I would recommend anticoagulation for at least 3 months.  Patient tolerating Eliquis fairly well continue Eliquis.   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 Will repeat blood work in 12 weeks.  #Severe iron deficient anemia-hemoglobin 7.6; iron saturation low;Ferritin- eleavted- ? Etiology- will repeat labs today. Recommend iron infusions if iron studies are low.   Thank you for allowing me to participate in the care of your pleasant patient. Please do not hesitate to contact me with questions or concerns in the interim.  # DISPOSITION: # labs today-ordered # follow up TBD-Dr.B

## 2019-12-02 LAB — HAPTOGLOBIN: Haptoglobin: 308 mg/dL — ABNORMAL HIGH (ref 33–278)

## 2019-12-13 ENCOUNTER — Telehealth: Payer: Self-pay | Admitting: Internal Medicine

## 2019-12-13 DIAGNOSIS — I2699 Other pulmonary embolism without acute cor pulmonale: Secondary | ICD-10-CM

## 2019-12-13 NOTE — Telephone Encounter (Signed)
On 10/20-left a detailed voicemail for the patient that her hemoglobin is improving from 7.5-9.5.  However iron studies not suggestive of iron deficiency; CRP elevated.  Likely secondary to inflammation/recent pneumonia.  I would not recommend any iron infusions at this time.  #For now I would recommend total of 3 months of anticoagulation [until end of December 2021].  We will repeat antiphospholipid antibody labs after coming off anticoagulation [early 2022].  Recommend continued follow-up with PCP.    C-schedule follow-up-in 4 weeks-MD labs; CBC BMP CRP- Dr.B.   FYI-Ms. Pollack.

## 2019-12-13 NOTE — Addendum Note (Signed)
Addended by: Mercer Pod E on: 12/13/2019 02:01 PM   Modules accepted: Orders

## 2019-12-13 NOTE — Telephone Encounter (Signed)
Labs ordered.

## 2019-12-14 ENCOUNTER — Telehealth: Payer: Self-pay | Admitting: *Deleted

## 2019-12-14 NOTE — Telephone Encounter (Signed)
Patient called reporting that she had a partial message from Dr B and asked for a return call to know what the plan is. I read Dr Senaida Lange note form yesterday which is detailed and relayed this to her. She will await a return call from scheduling for an appointment in 4 weeks

## 2019-12-18 ENCOUNTER — Encounter: Payer: Self-pay | Admitting: Physician Assistant

## 2019-12-18 ENCOUNTER — Encounter: Payer: Self-pay | Admitting: Internal Medicine

## 2019-12-18 ENCOUNTER — Other Ambulatory Visit: Payer: Self-pay

## 2019-12-18 DIAGNOSIS — I2699 Other pulmonary embolism without acute cor pulmonale: Secondary | ICD-10-CM

## 2019-12-18 MED ORDER — APIXABAN 5 MG PO TABS: ORAL_TABLET | ORAL | 1 refills | Status: DC

## 2019-12-19 ENCOUNTER — Inpatient Hospital Stay (HOSPITAL_BASED_OUTPATIENT_CLINIC_OR_DEPARTMENT_OTHER): Payer: BC Managed Care – PPO | Admitting: Oncology

## 2019-12-19 ENCOUNTER — Telehealth: Payer: Self-pay | Admitting: Internal Medicine

## 2019-12-19 ENCOUNTER — Ambulatory Visit
Admission: RE | Admit: 2019-12-19 | Discharge: 2019-12-19 | Disposition: A | Discharge status: Home / Self Care | Payer: BC Managed Care – PPO | Source: Ambulatory Visit | Attending: Oncology | Admitting: Oncology

## 2019-12-19 ENCOUNTER — Inpatient Hospital Stay
Admission: EM | Admit: 2019-12-19 | Discharge: 2019-12-22 | DRG: 193 | Disposition: A | Discharge status: Home / Self Care | Payer: BC Managed Care – PPO | Attending: Internal Medicine | Admitting: Internal Medicine

## 2019-12-19 ENCOUNTER — Emergency Department: Payer: BC Managed Care – PPO

## 2019-12-19 ENCOUNTER — Inpatient Hospital Stay: Payer: BC Managed Care – PPO

## 2019-12-19 ENCOUNTER — Encounter: Payer: Self-pay | Admitting: Emergency Medicine

## 2019-12-19 ENCOUNTER — Other Ambulatory Visit: Payer: Self-pay | Admitting: *Deleted

## 2019-12-19 ENCOUNTER — Telehealth: Payer: Self-pay | Admitting: Oncology

## 2019-12-19 ENCOUNTER — Other Ambulatory Visit: Payer: Self-pay

## 2019-12-19 VITALS — BP 145/96 | HR 104 | Temp 99.0°F | Resp 19 | Wt 268.0 lb

## 2019-12-19 DIAGNOSIS — R079 Chest pain, unspecified: Secondary | ICD-10-CM

## 2019-12-19 DIAGNOSIS — I2699 Other pulmonary embolism without acute cor pulmonale: Secondary | ICD-10-CM | POA: Diagnosis not present

## 2019-12-19 DIAGNOSIS — Z66 Do not resuscitate: Secondary | ICD-10-CM | POA: Diagnosis present

## 2019-12-19 DIAGNOSIS — J918 Pleural effusion in other conditions classified elsewhere: Secondary | ICD-10-CM | POA: Diagnosis present

## 2019-12-19 DIAGNOSIS — R059 Cough, unspecified: Secondary | ICD-10-CM

## 2019-12-19 DIAGNOSIS — Z86711 Personal history of pulmonary embolism: Secondary | ICD-10-CM

## 2019-12-19 DIAGNOSIS — Z20822 Contact with and (suspected) exposure to covid-19: Secondary | ICD-10-CM | POA: Diagnosis present

## 2019-12-19 DIAGNOSIS — U099 Post covid-19 condition, unspecified: Secondary | ICD-10-CM | POA: Diagnosis present

## 2019-12-19 DIAGNOSIS — J189 Pneumonia, unspecified organism: Secondary | ICD-10-CM | POA: Diagnosis not present

## 2019-12-19 DIAGNOSIS — Z975 Presence of (intrauterine) contraceptive device: Secondary | ICD-10-CM

## 2019-12-19 DIAGNOSIS — F418 Other specified anxiety disorders: Secondary | ICD-10-CM | POA: Diagnosis present

## 2019-12-19 DIAGNOSIS — R000 Tachycardia, unspecified: Secondary | ICD-10-CM | POA: Diagnosis present

## 2019-12-19 DIAGNOSIS — Z79899 Other long term (current) drug therapy: Secondary | ICD-10-CM

## 2019-12-19 DIAGNOSIS — D6861 Antiphospholipid syndrome: Secondary | ICD-10-CM | POA: Diagnosis present

## 2019-12-19 DIAGNOSIS — J90 Pleural effusion, not elsewhere classified: Secondary | ICD-10-CM | POA: Diagnosis not present

## 2019-12-19 DIAGNOSIS — I10 Essential (primary) hypertension: Secondary | ICD-10-CM | POA: Diagnosis present

## 2019-12-19 DIAGNOSIS — D721 Eosinophilia, unspecified: Secondary | ICD-10-CM | POA: Diagnosis present

## 2019-12-19 DIAGNOSIS — R9389 Abnormal findings on diagnostic imaging of other specified body structures: Secondary | ICD-10-CM

## 2019-12-19 DIAGNOSIS — R52 Pain, unspecified: Secondary | ICD-10-CM

## 2019-12-19 DIAGNOSIS — R0602 Shortness of breath: Secondary | ICD-10-CM | POA: Diagnosis not present

## 2019-12-19 DIAGNOSIS — Z793 Long term (current) use of hormonal contraceptives: Secondary | ICD-10-CM

## 2019-12-19 DIAGNOSIS — D509 Iron deficiency anemia, unspecified: Secondary | ICD-10-CM | POA: Diagnosis present

## 2019-12-19 DIAGNOSIS — F32A Depression, unspecified: Secondary | ICD-10-CM | POA: Diagnosis present

## 2019-12-19 DIAGNOSIS — Z9889 Other specified postprocedural states: Secondary | ICD-10-CM

## 2019-12-19 DIAGNOSIS — Z7901 Long term (current) use of anticoagulants: Secondary | ICD-10-CM

## 2019-12-19 DIAGNOSIS — F419 Anxiety disorder, unspecified: Secondary | ICD-10-CM | POA: Diagnosis present

## 2019-12-19 DIAGNOSIS — Z880 Allergy status to penicillin: Secondary | ICD-10-CM

## 2019-12-19 LAB — COMPREHENSIVE METABOLIC PANEL
ALT: 14 U/L (ref 0–44)
AST: 18 U/L (ref 15–41)
Albumin: 3.6 g/dL (ref 3.5–5.0)
Alkaline Phosphatase: 59 U/L (ref 38–126)
Anion gap: 10 (ref 5–15)
BUN: 10 mg/dL (ref 6–20)
CO2: 23 mmol/L (ref 22–32)
Calcium: 8.5 mg/dL — ABNORMAL LOW (ref 8.9–10.3)
Chloride: 103 mmol/L (ref 98–111)
Creatinine, Ser: 0.82 mg/dL (ref 0.44–1.00)
GFR, Estimated: >60 mL/min (ref >60)
Glucose, Bld: 117 mg/dL — ABNORMAL HIGH (ref 70–99)
Potassium: 3.9 mmol/L (ref 3.5–5.1)
Sodium: 136 mmol/L (ref 135–145)
Total Bilirubin: 0.7 mg/dL (ref 0.3–1.2)
Total Protein: 7.4 g/dL (ref 6.5–8.1)

## 2019-12-19 LAB — BASIC METABOLIC PANEL
Anion gap: 10 (ref 5–15)
BUN: 9 mg/dL (ref 6–20)
CO2: 25 mmol/L (ref 22–32)
Calcium: 9.1 mg/dL (ref 8.9–10.3)
Chloride: 100 mmol/L (ref 98–111)
Creatinine, Ser: 0.86 mg/dL (ref 0.44–1.00)
GFR, Estimated: >60 mL/min (ref >60)
Glucose, Bld: 143 mg/dL — ABNORMAL HIGH (ref 70–99)
Potassium: 3.5 mmol/L (ref 3.5–5.1)
Sodium: 135 mmol/L (ref 135–145)

## 2019-12-19 LAB — PROTIME-INR
INR: 1.2 (ref 0.8–1.2)
Prothrombin Time: 14.8 seconds (ref 11.4–15.2)

## 2019-12-19 LAB — APTT: aPTT: 49 seconds — ABNORMAL HIGH (ref 24–36)

## 2019-12-19 LAB — CBC WITH DIFFERENTIAL/PLATELET
Abs Immature Granulocytes: 0.04 10*3/uL (ref 0.00–0.07)
Basophils Absolute: 0.1 10*3/uL (ref 0.0–0.1)
Basophils Relative: 1 %
Eosinophils Absolute: 0.5 10*3/uL (ref 0.0–0.5)
Eosinophils Relative: 6 %
HCT: 31.9 % — ABNORMAL LOW (ref 36.0–46.0)
Hemoglobin: 9.9 g/dL — ABNORMAL LOW (ref 12.0–15.0)
Immature Granulocytes: 0 %
Lymphocytes Relative: 14 %
Lymphs Abs: 1.3 10*3/uL (ref 0.7–4.0)
MCH: 23 pg — ABNORMAL LOW (ref 26.0–34.0)
MCHC: 31 g/dL (ref 30.0–36.0)
MCV: 74.2 fL — ABNORMAL LOW (ref 80.0–100.0)
Monocytes Absolute: 0.5 10*3/uL (ref 0.1–1.0)
Monocytes Relative: 5 %
Neutro Abs: 7.2 10*3/uL (ref 1.7–7.7)
Neutrophils Relative %: 74 %
Platelets: 185 10*3/uL (ref 150–400)
RBC: 4.3 MIL/uL (ref 3.87–5.11)
RDW: 16.6 % — ABNORMAL HIGH (ref 11.5–15.5)
WBC: 9.6 10*3/uL (ref 4.0–10.5)
nRBC: 0 % (ref 0.0–0.2)

## 2019-12-19 LAB — C-REACTIVE PROTEIN: CRP: 4.3 mg/dL — ABNORMAL HIGH (ref <1.0)

## 2019-12-19 LAB — POC URINE PREG, ED: Preg Test, Ur: NEGATIVE

## 2019-12-19 LAB — CBC
HCT: 32.1 % — ABNORMAL LOW (ref 36.0–46.0)
Hemoglobin: 10 g/dL — ABNORMAL LOW (ref 12.0–15.0)
MCH: 23.3 pg — ABNORMAL LOW (ref 26.0–34.0)
MCHC: 31.2 g/dL (ref 30.0–36.0)
MCV: 74.7 fL — ABNORMAL LOW (ref 80.0–100.0)
Platelets: 188 10*3/uL (ref 150–400)
RBC: 4.3 MIL/uL (ref 3.87–5.11)
RDW: 16.8 % — ABNORMAL HIGH (ref 11.5–15.5)
WBC: 9.4 10*3/uL (ref 4.0–10.5)
nRBC: 0 % (ref 0.0–0.2)

## 2019-12-19 LAB — RESPIRATORY PANEL BY RT PCR (FLU A&B, COVID)
Influenza A by PCR: NEGATIVE
Influenza B by PCR: NEGATIVE
SARS Coronavirus 2 by RT PCR: NEGATIVE

## 2019-12-19 LAB — LACTIC ACID, PLASMA: Lactic Acid, Venous: 0.9 mmol/L (ref 0.5–1.9)

## 2019-12-19 MED ORDER — ESCITALOPRAM OXALATE 10 MG PO TABS
20.0000 mg | ORAL_TABLET | Freq: Every day | ORAL | Status: DC
  Administered 2019-12-20 – 2019-12-22 (×3): 20 mg via ORAL
  Filled 2019-12-19 (×3): qty 2

## 2019-12-19 MED ORDER — SENNOSIDES-DOCUSATE SODIUM 8.6-50 MG PO TABS: 1.0000 | ORAL_TABLET | Freq: Every evening | ORAL | Status: DC | PRN

## 2019-12-19 MED ORDER — APIXABAN 5 MG PO TABS
5.0000 mg | ORAL_TABLET | Freq: Two times a day (BID) | ORAL | Status: DC
  Administered 2019-12-20 – 2019-12-22 (×5): 5 mg via ORAL
  Filled 2019-12-19 (×5): qty 1

## 2019-12-19 MED ORDER — ACETAMINOPHEN 500 MG PO TABS
1000.0000 mg | ORAL_TABLET | Freq: Once | ORAL | Status: AC
  Administered 2019-12-19: 1000 mg via ORAL

## 2019-12-19 MED ORDER — ACETAMINOPHEN 650 MG RE SUPP: 650.0000 mg | Freq: Four times a day (QID) | RECTAL | Status: DC | PRN

## 2019-12-19 MED ORDER — LOSARTAN POTASSIUM-HCTZ 50-12.5 MG PO TABS: 1.0000 | ORAL_TABLET | Freq: Every day | ORAL | Status: DC

## 2019-12-19 MED ORDER — ONDANSETRON HCL 4 MG/2ML IJ SOLN: 4.0000 mg | Freq: Four times a day (QID) | INTRAMUSCULAR | Status: DC | PRN

## 2019-12-19 MED ORDER — TRAZODONE HCL 50 MG PO TABS
50.0000 mg | ORAL_TABLET | Freq: Every evening | ORAL | Status: DC | PRN
  Administered 2019-12-19 – 2019-12-21 (×3): 50 mg via ORAL
  Filled 2019-12-19 (×2): qty 1

## 2019-12-19 MED ORDER — ONDANSETRON HCL 4 MG PO TABS: 4.0000 mg | ORAL_TABLET | Freq: Four times a day (QID) | ORAL | Status: DC | PRN

## 2019-12-19 MED ORDER — HYDROCOD POLST-CPM POLST ER 10-8 MG/5ML PO SUER
5.0000 mL | Freq: Two times a day (BID) | ORAL | 0 refills | Status: DC | PRN
Start: 2019-12-19 — End: 2020-03-28

## 2019-12-19 MED ORDER — HYDROCHLOROTHIAZIDE 12.5 MG PO CAPS
12.5000 mg | ORAL_CAPSULE | Freq: Every day | ORAL | Status: DC
  Administered 2019-12-20 – 2019-12-21 (×2): 12.5 mg via ORAL
  Filled 2019-12-19 (×2): qty 1

## 2019-12-19 MED ORDER — IOHEXOL 350 MG/ML SOLN
75.0000 mL | Freq: Once | INTRAVENOUS | Status: AC | PRN
  Administered 2019-12-19: 75 mL via INTRAVENOUS

## 2019-12-19 MED ORDER — SODIUM CHLORIDE 0.9% FLUSH
3.0000 mL | Freq: Two times a day (BID) | INTRAVENOUS | Status: DC
  Administered 2019-12-20 – 2019-12-22 (×5): 3 mL via INTRAVENOUS

## 2019-12-19 MED ORDER — METHYLPREDNISOLONE SODIUM SUCC 40 MG IJ SOLR
40.0000 mg | Freq: Two times a day (BID) | INTRAMUSCULAR | Status: DC
  Administered 2019-12-19 – 2019-12-22 (×6): 40 mg via INTRAVENOUS
  Filled 2019-12-19 (×6): qty 1

## 2019-12-19 MED ORDER — PREDNISONE 10 MG (21) PO TBPK: ORAL_TABLET | ORAL | 0 refills | Status: DC

## 2019-12-19 MED ORDER — LOSARTAN POTASSIUM 50 MG PO TABS
50.0000 mg | ORAL_TABLET | Freq: Every day | ORAL | Status: DC
  Administered 2019-12-20 – 2019-12-21 (×2): 50 mg via ORAL
  Filled 2019-12-19 (×2): qty 1

## 2019-12-19 MED ORDER — ACETAMINOPHEN 325 MG PO TABS
650.0000 mg | ORAL_TABLET | Freq: Four times a day (QID) | ORAL | Status: DC | PRN
  Administered 2019-12-21: 650 mg via ORAL
  Filled 2019-12-19: qty 2

## 2019-12-19 NOTE — Telephone Encounter (Signed)
Larrie Kass, we were happy to.  Unfortunately it looks like she may have a new pneumonia (possibly Covid) versus worsening of pneumonia from September.  Dr. Donneta Romberg recommended she be worked up in the emergency room.  Patient and mother were agreeable to go.  Durenda Hurt, NP 12/19/2019 12:57 PM

## 2019-12-19 NOTE — Progress Notes (Signed)
g

## 2019-12-19 NOTE — H&P (Signed)
History and Physical    Alexandra Fleming OVZ:[id number] DOB: 08-09-1990 DOA: 12/19/2019  PCP: Trey Sailors, PA-C  Patient coming from: Home  I have personally briefly reviewed patient's old medical records in [facility]  Chief Complaint: Shortness of breath  HPI: Alexandra Fleming is a 29 y.o. female with medical history significant for hypertension, anemia, depression/anxiety, and history of presumed COVID-19 pneumonia in January-February 2021 (did not require hospitalization, no formal positive Covid test) with recently diagnosed PE who presents to the ED for evaluation of progressive shortness of breath.  Patient was recently hospitalized 9/25-9/29/21 for acute right lower lobe PE with associated pulmonary infarct.  She was initially treated with IV heparin and transition to Eliquis.  She was treated with empiric antibiotics for possible pneumonia at that time as well.  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  This was felt post Covid related per hematology and patient was kept on Eliquis anticoagulation.  Patient was seen in follow-up in hematology clinic earlier today (12/19/2019) with complaint of continued shortness of breath and right-sided back and chest pain. A follow-up CTA chest PE study was obtained which showed progressive diffuse patchy nodular airspace disease involving all lobes of both lungs, markedly increased compared to prior with area of collapse/consolidation noted in the right lower lobe with moderate right sided pleural effusion.  Trace PE also identified and subsegmental pulmonary arteries to the left lower lobe.  Patient was notified of the results and advised to present to the ED for further evaluation and management.  Patient states that she did miss 2 days of Eliquis (10/24 and 10/25) due to running out of her medications before being able to refill her prescription which she has now restarted.     Patient states that she has been having persistent shortness of breath occurring with minimal activity.  Has some discomfort with deep inspiration.  She has been having right lateral chest discomfort as well as discomfort under both shoulder blades.  She has not had any radiating chest pain or left-sided chest pain.  She reports subjective fever, chills, diaphoresis, and nausea without emesis.  She has had a cough occasionally productive of scant watery yellow sputum.  She has had tinges of blood in her sputum at times but no significant hemoptysis.  She denies any other obvious bleeding.  She has not seen any swelling in her feet or legs.  ED Course:  Initial vitals showed BP 154/105, pulse 101, RR 13, SPO2 96% on room air.  Labs show WBC 9.4, hemoglobin 10.0, platelets 188,000, sodium 135, potassium 3.5, bicarb 25, BUN 9, creatinine 0.86, serum glucose 143, urine pregnancy test negative.  SARS-CoV-2 PCR is negative.  Influenza A/B PCR's are negative.  Blood cultures were obtained and pending.  The hospitalist service was consulted to admit for further evaluation and management.  Review of Systems: All systems reviewed and are negative except as documented in history of present illness above.   Past Medical History:  Diagnosis Date  . COVID-19 virus infection   . Depression   . HTN (hypertension)     Past Surgical History:  Procedure Laterality Date  . WISDOM TOOTH EXTRACTION      Social History:  reports that she has never smoked. She has never used smokeless tobacco. She reports current alcohol use of about 6.0 standard drinks of alcohol per week. She reports that she does not use drugs.  Allergies  Allergen Reactions  . Amoxicillin   . Penicillins  Family History  Problem Relation Age of Onset  . Allergies Mother   . Breast cancer Mother 77       DCIS  . High Cholesterol Father   . Hypertension Father   . Sleep apnea Father   . Diabetes Maternal Grandmother    . Hypertension Maternal Grandmother   . COPD Maternal Grandfather   . Thyroid disease Paternal Grandmother   . Hypertension Paternal Grandmother   . Hyperlipidemia Paternal Grandmother   . Sleep apnea Paternal Grandfather   . Hypertension Paternal Grandfather   . Hyperlipidemia Paternal Grandfather   . Hypertension Paternal Aunt   . Anxiety disorder Paternal Aunt   . Hypertension Maternal Uncle      Prior to Admission medications   Medication Sig Start Date End Date Taking? Authorizing Provider  apixaban (ELIQUIS) 5 MG TABS tablet Take 2 tablets (10mg ) twice daily for 7 days, then 1 tablet (5mg ) twice daily 12/18/19   , PA-C  chlorpheniramine-HYDROcodone (TUSSIONEX) 10-8 MG/5ML SUER Take 5 mLs by mouth every 12 (twelve) hours as needed for cough. 12/19/19   Trey Sailors, NP  escitalopram (LEXAPRO) 20 MG tablet TAKE 1 TABLET(20 MG) BY MOUTH DAILY 11/15/19   Mauro Kaufmann, PA-C  hydrOXYzine (ATARAX/VISTARIL) 10 MG tablet Take 10 mg by mouth 3 (three) times daily as needed. Patient not taking: Reported on 12/19/2019    [provider]  levonorgestrel (MIRENA) 20 MCG/24HR IUD 1 each by Intrauterine route once.    [provider]  losartan-hydrochlorothiazide (HYZAAR) 50-12.5 MG tablet TAKE 1 TABLET BY MOUTH DAILY 11/14/19   12/21/2019, PA-C  predniSONE (STERAPRED UNI-PAK 21 TAB) 10 MG (21) TBPK tablet Take as directed. 12/19/19   Trey Sailors, NP    Physical Exam: Vitals:   12/19/19 1843 12/19/19 1919 12/19/19 1933  BP: (!) 154/105  (!) 147/109  Pulse: (!) 101  89  Resp: 13  16  Temp:  98.2 F (36.8 C)   TempSrc:  Oral   SpO2: 96%  94%   Constitutional: Sitting up in bed, NAD, calm, comfortable Eyes: PERRL, lids and conjunctivae normal ENMT: Mucous membranes are moist. Posterior pharynx clear of any exudate or lesions.Normal dentition.  Neck: normal, supple, no masses. Respiratory: Inspiratory crackles right lung base, no  wheezing.  Normal respiratory effort. No accessory muscle use.  Cardiovascular: Regular rate and rhythm, no murmurs / rubs / gallops. No extremity edema. 2+ pedal pulses. Abdomen: no tenderness, no masses palpated. No hepatosplenomegaly. Bowel sounds positive.  Musculoskeletal: no clubbing / cyanosis. No joint deformity upper and lower extremities. Good ROM, no contractures. Normal muscle tone.  Skin: no rashes, lesions, ulcers. No induration Neurologic: CN 2-12 grossly intact. Sensation intact, Strength 5/5 in all 4.  Psychiatric: Normal judgment and insight. Alert and oriented x 3. Normal mood.   Labs on Admission: I have personally reviewed following labs and imaging studies  CBC: Recent Labs  Lab 12/19/19 0819 12/19/19 1330  WBC 9.6 9.4  NEUTROABS 7.2  --   HGB 9.9* 10.0*  HCT 31.9* 32.1*  MCV 74.2* 74.7*  PLT 185 188   Basic Metabolic Panel: Recent Labs  Lab 12/19/19 0819 12/19/19 1330  NA 136 135  K 3.9 3.5  CL 103 100  CO2 23 25  GLUCOSE 117* 143*  BUN 10 9  CREATININE 0.82 0.86  CALCIUM 8.5* 9.1   GFR: Estimated Creatinine Clearance: 126 mL/min (by C-G formula based on SCr of 0.86 mg/dL). Liver Function Tests:  Recent Labs  Lab 12/19/19 0819  AST 18  ALT 14  ALKPHOS 59  BILITOT 0.7  PROT 7.4  ALBUMIN 3.6   No results for input(s): LIPASE, AMYLASE in the last 168 hours. No results for input(s): AMMONIA in the last 168 hours. Coagulation Profile: Recent Labs  Lab 12/19/19 1915  INR 1.2   Cardiac Enzymes: No results for input(s): CKTOTAL, CKMB, CKMBINDEX, TROPONINI in the last 168 hours. BNP (last 3 results) No results for input(s): PROBNP in the last 8760 hours. HbA1C: No results for input(s): HGBA1C in the last 72 hours. CBG: No results for input(s): GLUCAP in the last 168 hours. Lipid Profile: No results for input(s): CHOL, HDL, LDLCALC, TRIG, CHOLHDL, LDLDIRECT in the last 72 hours. Thyroid Function Tests: No results for input(s): TSH,  T4TOTAL, FREET4, T3FREE, THYROIDAB in the last 72 hours. Anemia Panel: No results for input(s): VITAMINB12, FOLATE, FERRITIN, TIBC, IRON, RETICCTPCT in the last 72 hours. Urine analysis: No results found for: COLORURINE, APPEARANCEUR, LABSPEC, PHURINE, GLUCOSEU, HGBUR, BILIRUBINUR, KETONESUR, PROTEINUR, UROBILINOGEN, NITRITE, LEUKOCYTESUR  Radiological Exams on Admission: CT ANGIO CHEST PE W OR WO CONTRAST  Result Date: 12/19/2019 CLINICAL DATA:  Chest pain.  Shortness of breath. EXAM: CT ANGIOGRAPHY CHEST WITH CONTRAST TECHNIQUE: Multidetector CT imaging of the chest was performed using the standard protocol during bolus administration of intravenous contrast. Multiplanar CT image reconstructions and MIPs were obtained to evaluate the vascular anatomy. CONTRAST:  75mL OMNIPAQUE IOHEXOL 350 MG/ML SOLN COMPARISON:  11/17/2019 FINDINGS: Cardiovascular: The heart size is normal. No substantial pericardial effusion. No thoracic aortic aneurysm. No large central pulmonary embolus. Trace pulmonary embolus identified in subsegmental pulmonary arteries to the left lower lobe (image 129/series 5 and image 190/series 5). No evidence for acute pulmonary embolus in the right lung. Mediastinum/Nodes: No mediastinal lymphadenopathy. There is no hilar lymphadenopathy. The esophagus has normal imaging features. There is no axillary lymphadenopathy. Lungs/Pleura: There is diffuse patchy and nodular airspace disease involving all lobes of both lungs. Areas of collapse/consolidation are noted in the right lower lobe and there is a small to moderate right pleural effusion. Upper Abdomen: Unremarkable. Musculoskeletal: No worrisome lytic or sclerotic osseous abnormality. Review of the MIP images confirms the above findings. IMPRESSION: 1. Trace pulmonary embolus identified in subsegmental pulmonary arteries to the left lower lobe. No evidence for acute pulmonary embolus in the right lung. 2. Diffuse patchy and nodular airspace  disease involving all lobes of both lungs. This is substantially progressed since 11/17/2019 as the patient had no upper lobe or right middle lobe involvement on the prior exam. Areas of collapse/consolidation are noted in the right lower lobe and there is a small to moderate right pleural effusion. Imaging features most likely secondary to infectious/inflammatory etiology. Atypical or viral pneumonia could have this appearance. Critical Value/emergent results were called by me at the time of interpretation on 12/19/2019 at 12:20 pm to provider JENNIFER BURNS , who verbally acknowledged these results. Electronically Signed   By: Kennith CenterEric  Mansell M.D.   On: 12/19/2019 12:33    EKG: Personally reviewed. Sinus rhythm without acute ischemic changes.  Assessment/Plan Principal Problem:   Pleural effusion on right Active Problems:   Pulmonary embolism (HCC)   Community acquired pneumonia, bilateral   Depression with anxiety   HTN (hypertension)  Alliana Yolonda KidaStinson Dain is a 29 y.o. female with medical history significant for hypertension, iron deficiency anemia, depression/anxiety, and history of presumed COVID-19 pneumonia in January-February 2021 (did not require hospitalization, no formal positive Covid test)  with recently diagnosed PE who is admitted with right-sided pleural effusion.  Post Covid bilateral pneumonia with right-sided pleural effusion: Patient with presumed COVID-19 pneumonia Jan/Feb 2021 not requiring hospitalization at that time.  Returns with diffuse patchy airspace disease involving all lungs of both lobes, significantly progressed compared to prior CTA.  Has associated right-sided pleural effusion with symptoms of pleurisy.  Suspect inflammatory/viral process at this time and less likely bacterial pneumonia.  SARS-CoV-2 PCR is negative. -Start IV Solu-Medrol 40 mg twice daily -Right-sided US thoracentesis ordered for a.m.-send labs for cell count/culture, cytology, protein, LDH,  glucose -Supplemental oxygen if needed -Incentive spirometer and flutter valve -Hold antibiotics at this time  Pulmonary emboli: CTA 9/25 showed acute PE of right lower lobe with associated pulmonary infarct.  Repeat CTA 10/27 shows acute trace PE and subsegmental pulmonary arteries to the left lower lobe.  Previous hypercoagulable work-up positive for antiphospholipid antibody syndrome was felt to be post Covid related per hematology. -Resume Eliquis tomorrow after plan thoracentesis  Iron deficiency anemia: Stable without evidence of significant bleeding.  Continue to monitor.  Hypertension: Continue losartan-HCTZ.  Depression/anxiety: Continue Lexapro.  DVT prophylaxis: Eliquis Code Status: DNR, confirmed with patient on admission Family Communication: Discussed with patient, she has discussed with family Disposition Plan: From home and likely discharge to home pending further work-up/management of respiratory illness Consults called: None Admission status:  Status is: Observation  The patient remains OBS appropriate and will d/c before 2 midnights.  Dispo: The patient is from: Home              Anticipated d/c is to: Home              Anticipated d/c date is: 1 day              Patient currently is not medically stable to d/c.   Darreld Mclean MD [facility]  If 7PM-7AM, please contact night-coverage [email or web address]  12/19/2019, 8:00 PM

## 2019-12-19 NOTE — Telephone Encounter (Signed)
On 10/26-spoke to patient regarding her concerns for intermittent chest pains.  Unclear if this is pleurisy versus PE [unlikely].  Patient states that she missed appx- 4 days of eliquis-as she had difficulty feeling the prescription.  Recommend evaluation in symptom management clinic-labs CBC CMP CRP. Pt in agreement.

## 2019-12-19 NOTE — Progress Notes (Addendum)
Symptom Management Consult note [facility]: Lambert Alexandra W  [phone] [phone]  Patient Care Team: Paulene Floor as PCP - General (Physician Assistant)   Name of the patient: Alexandra Fleming  [id number]  Aug 16, 1990   Date of visit: 12/19/2019   Diagnosis- PE/PNA on eliquis  Chief complaint/ Reason for visit- Chest pain  Heme/Onc history:  Oncology History   No history exists.   Mrs. Fleming is a 29 year old female with past medical history significant for hypertension, anxiety, depression, symptomatic anemia who was recently diagnosed with a pulmonary embolus and right lower lobe pneumonia on 11/17/2019 and right lower lobe pulmonary artery.  She was started on Eliquis and antibiotics.  She also had a positive lupus anticoagulant and positive for antiphospholipid antibody syndrome/DRV VT.  She was also found to be anemic with a hemoglobin of 7.8 requiring packed red blood cells and iron infusions.  She will require anticoagulation for at least 3 months.  Interval history- Today, she presents to symptom management for concerns of intermittent chest pain that she feels to be worse than when she was hospitalized.  She has been experiencing right-sided back and chest pain that radiates down and across her back.  Pain is intermittent and sharp lasting about 20 minutes.  She has been taking Tylenol without relief.  She also complains of a cough since she was diagnosed with pneumonia.  Cough is worse at bedtime interrupting her sleep.  She completed her antibiotics as prescribed she continues to have shortness of breath with exertion.  She denies any fevers although reports a low-grade temperature today during our visit.  Missed 2 days (Sunday and Monday) worth of Eliquis secondary to difficulty filling the medication.  We started last night.  ECOG FS:1 - Symptomatic but completely ambulatory  Review of systems- Review of Systems  Constitutional:  Positive for fever and malaise/fatigue. Negative for chills and weight loss.  HENT: Negative for congestion, ear pain and tinnitus.   Eyes: Negative.  Negative for blurred vision and double vision.  Respiratory: Positive for cough and shortness of breath. Negative for sputum production.   Cardiovascular: Positive for chest pain. Negative for palpitations and leg swelling.  Gastrointestinal: Negative.  Negative for abdominal pain, constipation, diarrhea, nausea and vomiting.  Genitourinary: Negative for dysuria, frequency and urgency.  Musculoskeletal: Negative for back pain and falls.  Skin: Negative.  Negative for rash.  Neurological: Positive for weakness. Negative for headaches.  Endo/Heme/Allergies: Negative.  Does not bruise/bleed easily.  Psychiatric/Behavioral: Negative for depression. The patient is nervous/anxious. The patient does not have insomnia.      Current treatment-on Eliquis  Allergies  Allergen Reactions  . Amoxicillin   . Penicillins      Past Medical History:  Diagnosis Date  . COVID-19 virus infection   . Depression   . HTN (hypertension)      Past Surgical History:  Procedure Laterality Date  . WISDOM TOOTH EXTRACTION      Social History   Socioeconomic History  . Marital status: Married    Spouse name: Not on file  . Number of children: Not on file  . Years of education: Not on file  . Highest education level: Not on file  Occupational History  . Not on file  Tobacco Use  . Smoking status: Never Smoker  . Smokeless tobacco: Never Used  Substance and Sexual Activity  . Alcohol use: Yes    Alcohol/week: 6.0 standard drinks    Types: 2 Glasses  of wine, 4 Cans of beer per week  . Drug use: No  . Sexual activity: Not on file  Other Topics Concern  . Not on file  Social History Narrative   No smoking; ocassional alcohol. Used to be Educational psychologist; Naval architect. Live sin graham/husband.    Social Determinants of  Health   Financial Resource Strain: Medium Risk  . Difficulty of Paying Living Expenses: Somewhat hard  Food Insecurity: No Food Insecurity  . Worried About Charity fundraiser in the Last Year: Never true  . Ran Out of Food in the Last Year: Never true  Transportation Needs: No Transportation Needs  . Lack of Transportation (Medical): No  . Lack of Transportation (Non-Medical): No  Physical Activity: Inactive  . Days of Exercise per Week: 0 days  . Minutes of Exercise per Session: 0 min  Stress:   . Feeling of Stress : Not on file  Social Connections: Moderately Isolated  . Frequency of Communication with Friends and Family: More than three times a week  . Frequency of Social Gatherings with Friends and Family: More than three times a week  . Attends Religious Services: Never  . Active Member of Clubs or Organizations: No  . Attends Archivist Meetings: Never  . Marital Status: Married  Human resources officer Violence:   . Fear of Current or Ex-Partner: Not on file  . Emotionally Abused: Not on file  . Physically Abused: Not on file  . Sexually Abused: Not on file    Family History  Problem Relation Age of Onset  . Allergies Mother   . Breast cancer Mother 1       DCIS  . High Cholesterol Father   . Hypertension Father   . Sleep apnea Father   . Diabetes Maternal Grandmother   . Hypertension Maternal Grandmother   . COPD Maternal Grandfather   . Thyroid disease Paternal Grandmother   . Hypertension Paternal Grandmother   . Hyperlipidemia Paternal Grandmother   . Sleep apnea Paternal Grandfather   . Hypertension Paternal Grandfather   . Hyperlipidemia Paternal Grandfather   . Hypertension Paternal Aunt   . Anxiety disorder Paternal Aunt   . Hypertension Maternal Uncle      Current Outpatient Medications:  .  apixaban (ELIQUIS) 5 MG TABS tablet, Take 2 tablets (20m) twice daily for 7 days, then 1 tablet (554m twice daily, Disp: 60 tablet, Rfl: 1 .   escitalopram (LEXAPRO) 20 MG tablet, TAKE 1 TABLET(20 MG) BY MOUTH DAILY, Disp: 90 tablet, Rfl: 0 .  levonorgestrel (MIRENA) 20 MCG/24HR IUD, 1 each by Intrauterine route once., Disp: , Rfl:  .  losartan-hydrochlorothiazide (HYZAAR) 50-12.5 MG tablet, TAKE 1 TABLET BY MOUTH DAILY, Disp: 90 tablet, Rfl: 0 .  chlorpheniramine-HYDROcodone (TUSSIONEX) 10-8 MG/5ML SUER, Take 5 mLs by mouth every 12 (twelve) hours as needed for cough., Disp: 140 mL, Rfl: 0 .  hydrOXYzine (ATARAX/VISTARIL) 10 MG tablet, Take 10 mg by mouth 3 (three) times daily as needed. (Patient not taking: Reported on 12/19/2019), Disp: , Rfl:  .  predniSONE (STERAPRED UNI-PAK 21 TAB) 10 MG (21) TBPK tablet, Take as directed., Disp: 21 tablet, Rfl: 0  Physical exam:  Vitals:   12/19/19 0829 12/19/19 0838  BP:  (!) 145/96  Pulse:  (!) 104  Resp:  19  Temp:  99 F (37.2 C)  TempSrc:  Oral  SpO2:  97%  Weight: 268 lb (121.6 kg)    Physical Exam Constitutional:  Appearance: Normal appearance.  HENT:     Head: Normocephalic and atraumatic.  Eyes:     Pupils: Pupils are equal, round, and reactive to light.  Cardiovascular:     Rate and Rhythm: Normal rate and regular rhythm.     Heart sounds: Normal heart sounds. No murmur heard.   Pulmonary:     Effort: Pulmonary effort is normal.     Breath sounds: Normal breath sounds. No wheezing.  Abdominal:     General: Bowel sounds are normal. There is no distension.     Palpations: Abdomen is soft.     Tenderness: There is no abdominal tenderness.  Musculoskeletal:        General: Normal range of motion.     Cervical back: Normal range of motion.  Skin:    General: Skin is warm and dry.     Findings: No rash.  Neurological:     Mental Status: She is alert and oriented to person, place, and time.  Psychiatric:        Judgment: Judgment normal.      CMP Latest Ref Rng & Units 12/19/2019  Glucose 70 - 99 mg/dL 117(H)  BUN 6 - 20 mg/dL 10  Creatinine 0.44 - 1.00  mg/dL 0.82  Sodium 135 - 145 mmol/L 136  Potassium 3.5 - 5.1 mmol/L 3.9  Chloride 98 - 111 mmol/L 103  CO2 22 - 32 mmol/L 23  Calcium 8.9 - 10.3 mg/dL 8.5(L)  Total Protein 6.5 - 8.1 g/dL 7.4  Total Bilirubin 0.3 - 1.2 mg/dL 0.7  Alkaline Phos 38 - 126 U/L 59  AST 15 - 41 U/L 18  ALT 0 - 44 U/L 14   CBC Latest Ref Rng & Units 12/19/2019  WBC 4.0 - 10.5 K/uL 9.6  Hemoglobin 12.0 - 15.0 g/dL 9.9(L)  Hematocrit 36 - 46 % 31.9(L)  Platelets 150 - 400 K/uL 185    No images are attached to the encounter.  No results found.   Assessment and plan- Patient is a 29 y.o. female who presents to symptom management for concerns of right-sided chest pain that appears to be worsening since discharge from the hospital.  Pulmonary embolus-diagnosed on 11/19/2019 after presenting to the emergency room with shortness of breath and chest pain.  She also was diagnosed with pneumonia and treated with IV antibiotics and discharged with Levaquin x5 days.  She is currently on Eliquis.  She has missed 2 days of Eliquis (Sunday and Monday) secondary to difficulty getting prescription filled.  She restarted last night.  Pneumonia/cough-status post Z-Pak in the hospital and 5 days of Levaquin p.o.  She completed her course.  Continues to have a cough that is worse at bedtime.  Will get imaging to rule out recurrent pneumonia.  We also can try some cough medicine to help at nighttime given she is experiencing some insomnia.  Tachycardia-likely secondary to existing pulmonary embolus/pneumonia plus anxiety.  Will get EKG to rule out arrhythmia.  Will get imaging see above.  Pleurisy-likely secondary to existing PE.  Will get CT angio to rule out additional PE given she missed several days of her anticoagulation EKG. Will also start steroids.  Microcytic anemia-secondary to PE.  Received 2 doses of IV iron and packed red blood cells while in the hospital.  Repeat ferritin level 119 on 11/30/2019.  Does not require  additional iron at this time.  Significant improvement of hemoglobin today.  Suspect this will continue to improve over time.  Plan: Labs-CBC,  CMP and ESR-fairly unremarkable EKG-normal sinus rhythm CTA-we will call with results Rx steroid taper for pleurisy. Possible antibiotics if persistent pneumonia.  We will wait for imaging. Rx Tussionex for cough.  Disposition: Return to clinic as scheduled.     Visit Diagnosis 1. Tachycardia   2. Pulmonary embolism on right ([place])   3. Cough   4. Chest pain, unspecified type     Patient expressed understanding and was in agreement with this plan. She also understands that She can call clinic at any time with any questions, concerns, or complaints.   Greater than 50% was spent in counseling and coordination of care with this patient including but not limited to discussion of the relevant topics above (See A&P) including, but not limited to diagnosis and management of acute and chronic medical conditions.   Thank you for allowing me to participate in the care of this very pleasant patient.    Jacquelin Hawking, NP [place] [facility] Cell - [phone] Pager- [phone] 12/19/2019 10:55 AM

## 2019-12-19 NOTE — Telephone Encounter (Signed)
HI Alexandra Fleming,   I ordered at home sleep study a couple of weeks ago. Patient hasn't heard anything. She is currently in the ER and I do believe she will be hospitalized so it may be best to call in a few days.   Ricki Rodriguez

## 2019-12-19 NOTE — Telephone Encounter (Signed)
On 10/27-reviewed the patient's clinical status with nurse practitioner Boneta Lucks.  CT scan-report still pending [?  New small PE].  Independently reviewed shows multifocal worsening pneumonia.  Covid versus others.  Prior history of Covid infection in jan/feb 2021.  Recommend evaluation at the emergency room.  Possible need for pulmonary evaluation/bronc etc.  Discussion the patient and the mother.  They agree with the treatment plan.  FYI-Ms. Vernell Morgans, PA-C

## 2019-12-19 NOTE — Progress Notes (Signed)
It seems this pain has really been happening intermittently since leaving the  Hospital. Even before she missed 4 consecutive days of eliquis. Pain comes out of nowhere. It occurred this am while laying in the bed. Feels like the intial pain of being diagnosed (intense, blinding) with PE. Pain can be  on R lateral side (like a stitch) or between shoulder blades on the Right side. If the pain is on her Left side it is always behind her shoulder. She now thinks she feels worse then in the hospital. Cough is aggravating, worse she thinks, non productive.

## 2019-12-19 NOTE — ED Notes (Signed)
Pt provided EKG reading from 09:38 this morning at the cancer center.  Per Dr. Scotty Court, no EKG needs to be performed at this time for the patient.

## 2019-12-19 NOTE — ED Triage Notes (Signed)
Pt comes into the ED via POV stating her Cancer Md sent her over here d/t results of her CT scan.  Pt has CT scan done a while ago which showed PE and pneumonia.  Pain progressed and pt was sent for another CT today where it shows the PE still present and worsening pneumonia and small pneumothorax. Pt states her pain is on the right side of her abdomen and and chest. Pt has dyspnea with exertion. Denies any CP or abdominal pain.  Pt still currently on eliquis.

## 2019-12-19 NOTE — Telephone Encounter (Signed)
Re: imaging  Received phone call from Dr. Jackquline Bosch from [facility] radiology to discuss patient's recent CT scan.   Scan reads trace pulmonary embolus and subsegmental pulmonary artery to the left lower lobe and diffuse patchy and nodular airspace disease involving all lobes of both lungs.  This is substantially progressed since 11/17/2019 as the patient had no upper lobe or right middle lobe involvement on the prior exam.  Areas of collapse/consolidation are noted in the right lower lobe and there is small to moderate right pleural effusion.  Imaging features most likely secondary to infectious./Inflammatory etiology.  Atypical or viral pneumonia could have this appearance.  Spoke with Dr. Donneta Romberg who recommends evaluation in the emergency room.  Unclear if this is a new infection like COVID-19 pneumonia versus worsening pneumonia from her initial emergency room visit in September.  Patient and mother are agreeable to be seen in the emergency room.  Durenda Hurt, NP 12/19/2019 12:55 PM

## 2019-12-19 NOTE — Telephone Encounter (Addendum)
Thanks for seeing her, Dr. Leonard Schwartz and Victorino Dike, and glad you were able to get imaging so quickly. I'll be following along.   Best, Ricki Rodriguez

## 2019-12-19 NOTE — ED Provider Notes (Signed)
[facility] Emergency Department Provider Note    First MD Initiated Contact with Patient 12/19/19 1823     (approximate)  I have reviewed the triage vital signs and the nursing notes.   HISTORY  Chief Complaint Pneumonia    HPI Alexandra Fleming is a 29 y.o. female below listed past medical history with previous diagnosis of COVID-19 pneumonia Alexandra Fleming is here subsequently developing PE now on Eliquis as well as recent treated for pneumonia presents to the ER for worsening shortness of breath cough congestion orthopnea and chills.  Had outpatient work-up at hematology clinic including CT today which showed evidence of progression of pneumonia consolidations in the lungs with diffuse groundglass patchy opacity associated with moderate effusion and persistent PE.  Given her symptoms was sent to the ER for further evaluation.    Past Medical History:  Diagnosis Date  . COVID-19 virus infection   . Depression   . HTN (hypertension)    Family History  Problem Relation Age of Onset  . Allergies Mother   . Breast cancer Mother 77       DCIS  . High Cholesterol Father   . Hypertension Father   . Sleep apnea Father   . Diabetes Maternal Grandmother   . Hypertension Maternal Grandmother   . COPD Maternal Grandfather   . Thyroid disease Paternal Grandmother   . Hypertension Paternal Grandmother   . Hyperlipidemia Paternal Grandmother   . Sleep apnea Paternal Grandfather   . Hypertension Paternal Grandfather   . Hyperlipidemia Paternal Grandfather   . Hypertension Paternal Aunt   . Anxiety disorder Paternal Aunt   . Hypertension Maternal Uncle    Past Surgical History:  Procedure Laterality Date  . WISDOM TOOTH EXTRACTION     Patient Active Problem List   Diagnosis Date Noted  . Pleural effusion on right 12/19/2019  . Symptomatic anemia 11/30/2019  . Pulmonary embolism on right (HCC) 11/18/2019  . Pulmonary embolism (HCC) 11/17/2019  .  Community acquired pneumonia, bilateral 11/17/2019  . Depression with anxiety 11/17/2019  . HTN (hypertension) 11/17/2019  . Acute respiratory failure with hypoxia (HCC) 11/17/2019  . Sepsis (HCC) 11/17/2019  . Anxiety 03/18/2017      Prior to Admission medications   Medication Sig Start Date End Date Taking? Authorizing Provider  apixaban (ELIQUIS) 5 MG TABS tablet Take 2 tablets (10mg ) twice daily for 7 days, then 1 tablet (5mg ) twice daily 12/18/19   , PA-C  chlorpheniramine-HYDROcodone (TUSSIONEX) 10-8 MG/5ML SUER Take 5 mLs by mouth every 12 (twelve) hours as needed for cough. 12/19/19   Trey Sailors, NP  escitalopram (LEXAPRO) 20 MG tablet TAKE 1 TABLET(20 MG) BY MOUTH DAILY 11/15/19   Mauro Kaufmann, PA-C  hydrOXYzine (ATARAX/VISTARIL) 10 MG tablet Take 10 mg by mouth 3 (three) times daily as needed. Patient not taking: Reported on 12/19/2019    [provider]  levonorgestrel (MIRENA) 20 MCG/24HR IUD 1 each by Intrauterine route once.    [provider]  losartan-hydrochlorothiazide (HYZAAR) 50-12.5 MG tablet TAKE 1 TABLET BY MOUTH DAILY 11/14/19   12/21/2019, PA-C  predniSONE (STERAPRED UNI-PAK 21 TAB) 10 MG (21) TBPK tablet Take as directed. 12/19/19   Trey Sailors, NP    Allergies Amoxicillin and Penicillins    Social History Social History   Tobacco Use  . Smoking status: Never Smoker  . Smokeless tobacco: Never Used  Substance Use Topics  . Alcohol use: Yes  Alcohol/week: 6.0 standard drinks    Types: 2 Glasses of wine, 4 Cans of beer per week  . Drug use: No    Review of Systems Patient denies headaches, rhinorrhea, blurry vision, numbness, shortness of breath, chest pain, edema, cough, abdominal pain, nausea, vomiting, diarrhea, dysuria, fevers, rashes or hallucinations unless otherwise stated above in HPI. ____________________________________________   PHYSICAL EXAM:  VITAL SIGNS: Vitals:    12/19/19 1843 12/19/19 1919  BP: (!) 154/105   Pulse: (!) 101   Resp: 13   Temp:  98.2 F (36.8 C)  SpO2: 96%     Constitutional: Alert and oriented.  Eyes: Conjunctivae are normal.  Head: Atraumatic. Nose: No congestion/rhinnorhea. Mouth/Throat: Mucous membranes are moist.   Neck: No stridor. Painless ROM.  Cardiovascular: Normal rate, regular rhythm. Grossly normal heart sounds.  Good peripheral circulation. Respiratory: Normal respiratory effort.  No retractions. Lungs with scattered crackle, diminished posterior bs Gastrointestinal: Soft and nontender. No distention. No abdominal bruits. No CVA tenderness. Genitourinary:  Musculoskeletal: No lower extremity tenderness nor edema.  No joint effusions. Neurologic:  Normal speech and language. No gross focal neurologic deficits are appreciated. No facial droop Skin:  Skin is warm, dry and intact. No rash noted. Psychiatric: Mood and affect are normal. Speech and behavior are normal.  ____________________________________________   LABS (all labs ordered are listed, but only abnormal results are displayed)  Results for orders placed or performed during the hospital encounter of 12/19/19 (from the past 24 hour(s))  Basic metabolic panel     Status: Abnormal   Collection Time: 12/19/19  1:30 PM  Result Value Ref Range   Sodium 135 135 - 145 mmol/L   Potassium 3.5 3.5 - 5.1 mmol/L   Chloride 100 98 - 111 mmol/L   CO2 25 22 - 32 mmol/L   Glucose, Bld 143 (H) 70 - 99 mg/dL   BUN 9 6 - 20 mg/dL   Creatinine, Ser 7.74 0.44 - 1.00 mg/dL   Calcium 9.1 8.9 - 12.8 mg/dL   GFR, Estimated >78 >67 mL/min   Anion gap 10 5 - 15  CBC     Status: Abnormal   Collection Time: 12/19/19  1:30 PM  Result Value Ref Range   WBC 9.4 4.0 - 10.5 K/uL   RBC 4.30 3.87 - 5.11 MIL/uL   Hemoglobin 10.0 (L) 12.0 - 15.0 g/dL   HCT 67.2 (L) 36 - 46 %   MCV 74.7 (L) 80.0 - 100.0 fL   MCH 23.3 (L) 26.0 - 34.0 pg   MCHC 31.2 30.0 - 36.0 g/dL   RDW 09.4  (H) 70.9 - 15.5 %   Platelets 188 150 - 400 K/uL   nRBC 0.0 0.0 - 0.2 %  POC urine preg, ED     Status: None   Collection Time: 12/19/19  1:58 PM  Result Value Ref Range   Preg Test, Ur Negative Negative  Respiratory Panel by RT PCR (Flu A&B, Covid) - Nasopharyngeal Swab     Status: None   Collection Time: 12/19/19  4:22 PM   Specimen: Nasopharyngeal Swab  Result Value Ref Range   SARS Coronavirus 2 by RT PCR NEGATIVE NEGATIVE   Influenza A by PCR NEGATIVE NEGATIVE   Influenza B by PCR NEGATIVE NEGATIVE   ____________________________________________  EKG My review and personal interpretation at Time: 9:38   Indication: sob  Rate: 95  Rhythm: sinus Axis: normal Other: normal intervals, no stemi ____________________________________________  RADIOLOGY  I personally reviewed all radiographic images  ordered to evaluate for the above acute complaints and reviewed radiology reports and findings.  These findings were personally discussed with the patient.  Please see medical record for radiology report.  ____________________________________________   PROCEDURES  Procedure(s) performed:  Procedures    Critical Care performed: no ____________________________________________   INITIAL IMPRESSION / ASSESSMENT AND PLAN / ED COURSE  Pertinent labs & imaging results that were available during my care of the patient were reviewed by me and considered in my medical decision making (see chart for details).   DDX: Asthma, copd, CHF, pna, ptx, malignancy, Pe, anemia   Alexandra Fleming is a 29 y.o. who presents to the ED with presentation as described above.  Patient with known PEs no worsening PE burden she is on Eliquis but CT imaging is concerning for worsening inflammatory infectious process associated with moderate effusion.  She is not hypoxic at rest but clinically does feel that she is worsening.  Currently not febrile no white count.  Patient sent from hematology clinic  for admission for pulmonology consultation further evaluation.  I will sign off further infectious markers but her white count is normal no fever no hypoxia therefore will hold off on initiating broad-spectrum antibiotics at this time.  This simply may be more inflammatory post Covid syndrome type of presentation.  Will discuss with hospitalist for admission     The patient was evaluated in Emergency Department today for the symptoms described in the history of present illness. He/she was evaluated in the context of the global COVID-19 pandemic, which necessitated consideration that the patient might be at risk for infection with the SARS-CoV-2 virus that causes COVID-19. Institutional protocols and algorithms that pertain to the evaluation of patients at risk for COVID-19 are in a state of rapid change based on information released by regulatory bodies including the CDC and federal and state organizations. These policies and algorithms were followed during the patient's care in the ED.  As part of my medical decision making, I reviewed the following data within the electronic MEDICAL RECORD NUMBER Nursing notes reviewed and incorporated, Labs reviewed, notes from prior ED visits and [place] Controlled Substance Database   ____________________________________________   FINAL CLINICAL IMPRESSION(S) / ED DIAGNOSES  Final diagnoses:  Shortness of breath  Pleural effusion      NEW MEDICATIONS STARTED DURING THIS VISIT:  New Prescriptions   No medications on file     Note:  This document was prepared using Dragon voice recognition software and may include unintentional dictation errors.    Willy Eddy, MD 12/19/19 Serena Croissant

## 2019-12-20 ENCOUNTER — Observation Stay: Payer: BC Managed Care – PPO

## 2019-12-20 DIAGNOSIS — U099 Post covid-19 condition, unspecified: Secondary | ICD-10-CM | POA: Diagnosis present

## 2019-12-20 DIAGNOSIS — F419 Anxiety disorder, unspecified: Secondary | ICD-10-CM | POA: Diagnosis present

## 2019-12-20 DIAGNOSIS — Z79899 Other long term (current) drug therapy: Secondary | ICD-10-CM | POA: Diagnosis not present

## 2019-12-20 DIAGNOSIS — J918 Pleural effusion in other conditions classified elsewhere: Secondary | ICD-10-CM | POA: Diagnosis present

## 2019-12-20 DIAGNOSIS — R0602 Shortness of breath: Secondary | ICD-10-CM | POA: Diagnosis present

## 2019-12-20 DIAGNOSIS — D6861 Antiphospholipid syndrome: Secondary | ICD-10-CM | POA: Diagnosis present

## 2019-12-20 DIAGNOSIS — F32A Depression, unspecified: Secondary | ICD-10-CM | POA: Diagnosis present

## 2019-12-20 DIAGNOSIS — D509 Iron deficiency anemia, unspecified: Secondary | ICD-10-CM | POA: Diagnosis present

## 2019-12-20 DIAGNOSIS — J90 Pleural effusion, not elsewhere classified: Secondary | ICD-10-CM | POA: Diagnosis not present

## 2019-12-20 DIAGNOSIS — J189 Pneumonia, unspecified organism: Secondary | ICD-10-CM | POA: Diagnosis present

## 2019-12-20 DIAGNOSIS — Z7901 Long term (current) use of anticoagulants: Secondary | ICD-10-CM | POA: Diagnosis not present

## 2019-12-20 DIAGNOSIS — Z86711 Personal history of pulmonary embolism: Secondary | ICD-10-CM | POA: Diagnosis not present

## 2019-12-20 DIAGNOSIS — Z793 Long term (current) use of hormonal contraceptives: Secondary | ICD-10-CM | POA: Diagnosis not present

## 2019-12-20 DIAGNOSIS — Z20822 Contact with and (suspected) exposure to covid-19: Secondary | ICD-10-CM | POA: Diagnosis present

## 2019-12-20 DIAGNOSIS — D721 Eosinophilia, unspecified: Secondary | ICD-10-CM | POA: Diagnosis present

## 2019-12-20 DIAGNOSIS — I2699 Other pulmonary embolism without acute cor pulmonale: Secondary | ICD-10-CM | POA: Diagnosis present

## 2019-12-20 DIAGNOSIS — Z975 Presence of (intrauterine) contraceptive device: Secondary | ICD-10-CM | POA: Diagnosis not present

## 2019-12-20 DIAGNOSIS — R000 Tachycardia, unspecified: Secondary | ICD-10-CM | POA: Diagnosis present

## 2019-12-20 DIAGNOSIS — Z880 Allergy status to penicillin: Secondary | ICD-10-CM | POA: Diagnosis not present

## 2019-12-20 DIAGNOSIS — Z66 Do not resuscitate: Secondary | ICD-10-CM | POA: Diagnosis present

## 2019-12-20 DIAGNOSIS — I10 Essential (primary) hypertension: Secondary | ICD-10-CM | POA: Diagnosis present

## 2019-12-20 LAB — BODY FLUID CELL COUNT WITH DIFFERENTIAL
Eos, Fluid: 63 %
Lymphs, Fluid: 10 %
Monocyte-Macrophage-Serous Fluid: 13 %
Neutrophil Count, Fluid: 12 %
Other Cells, Fluid: 2 %
Total Nucleated Cell Count, Fluid: 2288 cu mm

## 2019-12-20 LAB — CBC
HCT: 31.8 % — ABNORMAL LOW (ref 36.0–46.0)
Hemoglobin: 9.8 g/dL — ABNORMAL LOW (ref 12.0–15.0)
MCH: 23.2 pg — ABNORMAL LOW (ref 26.0–34.0)
MCHC: 30.8 g/dL (ref 30.0–36.0)
MCV: 75.4 fL — ABNORMAL LOW (ref 80.0–100.0)
Platelets: 208 10*3/uL (ref 150–400)
RBC: 4.22 MIL/uL (ref 3.87–5.11)
RDW: 16.9 % — ABNORMAL HIGH (ref 11.5–15.5)
WBC: 9 10*3/uL (ref 4.0–10.5)
nRBC: 0 % (ref 0.0–0.2)

## 2019-12-20 LAB — COMPREHENSIVE METABOLIC PANEL
ALT: 15 U/L (ref 0–44)
AST: 21 U/L (ref 15–41)
Albumin: 3.9 g/dL (ref 3.5–5.0)
Alkaline Phosphatase: 67 U/L (ref 38–126)
Anion gap: 9 (ref 5–15)
BUN: 11 mg/dL (ref 6–20)
CO2: 24 mmol/L (ref 22–32)
Calcium: 9.5 mg/dL (ref 8.9–10.3)
Chloride: 104 mmol/L (ref 98–111)
Creatinine, Ser: 0.75 mg/dL (ref 0.44–1.00)
GFR, Estimated: >60 mL/min (ref >60)
Glucose, Bld: 152 mg/dL — ABNORMAL HIGH (ref 70–99)
Potassium: 4.3 mmol/L (ref 3.5–5.1)
Sodium: 137 mmol/L (ref 135–145)
Total Bilirubin: 0.6 mg/dL (ref 0.3–1.2)
Total Protein: 7.8 g/dL (ref 6.5–8.1)

## 2019-12-20 LAB — GLUCOSE, PLEURAL OR PERITONEAL FLUID: Glucose, Fluid: 134 mg/dL

## 2019-12-20 LAB — PROCALCITONIN: Procalcitonin: <0.1 ng/mL

## 2019-12-20 LAB — LACTATE DEHYDROGENASE: LDH: 237 U/L — ABNORMAL HIGH (ref 98–192)

## 2019-12-20 LAB — PROTEIN, PLEURAL OR PERITONEAL FLUID: Total protein, fluid: 5.2 g/dL

## 2019-12-20 LAB — LACTATE DEHYDROGENASE, PLEURAL OR PERITONEAL FLUID: LD, Fluid: 196 U/L — ABNORMAL HIGH (ref 3–23)

## 2019-12-20 LAB — BRAIN NATRIURETIC PEPTIDE: B Natriuretic Peptide: 69.7 pg/mL (ref 0.0–100.0)

## 2019-12-20 MED ORDER — HYDRALAZINE HCL 10 MG PO TABS
10.0000 mg | ORAL_TABLET | Freq: Four times a day (QID) | ORAL | Status: DC
  Administered 2019-12-20 – 2019-12-21 (×4): 10 mg via ORAL
  Filled 2019-12-20 (×6): qty 1

## 2019-12-20 NOTE — Progress Notes (Signed)
Attempted to call report to 1A - unable to take report at this time

## 2019-12-20 NOTE — Progress Notes (Signed)
PROGRESS NOTE    Alexandra Fleming  SHF:[id number] DOB: [birth year] DOA: 12/19/2019 PCP: Trey Sailors, PA-C    Brief Narrative:  Alexandra Fleming is a 29 y.o. female with medical history significant for hypertension, anemia, depression/anxiety, and history of presumed COVID-19 pneumonia in January-February 2021 (did not require hospitalization, no formal positive Covid test) with recently diagnosed PE who presents to the ED for evaluation of progressive shortness of breath  10/28- plan for thoracentesis today  Consultants:     Procedures:  cta chest 1. Trace pulmonary embolus identified in subsegmental pulmonary arteries to the left lower lobe. No evidence for acute pulmonary embolus in the right lung. 2. Diffuse patchy and nodular airspace disease involving all lobes of both lungs. This is substantially progressed since 11/17/2019 as the patient had no upper lobe or right middle lobe involvement on the prior exam. Areas of collapse/consolidation are noted in the right lower lobe and there is a small to moderate right pleural effusion. Imaging features most likely secondary to infectious/inflammatory etiology. Atypical or viral pneumonia could have this appearance.  Antimicrobials:       Subjective: No new complaints.  Shortness of breath with exertion mostly.  No chest pain.  Objective: Vitals:   12/19/19 1933 12/19/19 2218 12/20/19 0330 12/20/19 0600  BP: (!) 147/109 (!) 159/91 (!) 144/99 (!) 145/83  Pulse: 89 99 93 100  Resp: 16 18 (!) 23 20  Temp:      TempSrc:      SpO2: 94% 94% 92% 98%   No intake or output data in the 24 hours ending 12/20/19 0902 There were no vitals filed for this visit.  Examination:  General exam: Appears calm and comfortable  Respiratory system:coarse bs, no wheezing Cardiovascular system: S1 & S2 heard, RRR. No JVD, murmurs, rubs, gallops or clicks. No pedal edema. Gastrointestinal system: Abdomen is  nondistended, soft and nontender.Normal bowel sounds heard. Central nervous system: Alert and oriented. No focal neurological deficits. Extremities: No edema Skin: Warm dry Psychiatry: Judgement and insight appear normal. Mood & affect appropriate.     Data Reviewed: I have personally reviewed following labs and imaging studies  CBC: Recent Labs  Lab 12/19/19 0819 12/19/19 1330 12/20/19 0412  WBC 9.6 9.4 9.0  NEUTROABS 7.2  --   --   HGB 9.9* 10.0* 9.8*  HCT 31.9* 32.1* 31.8*  MCV 74.2* 74.7* 75.4*  PLT 185 188 208   Basic Metabolic Panel: Recent Labs  Lab 12/19/19 0819 12/19/19 1330 12/20/19 0412  NA 136 135 137  K 3.9 3.5 4.3  CL 103 100 104  CO2 23 25 24   GLUCOSE 117* 143* 152*  BUN 10 9 11   CREATININE 0.82 0.86 0.75  CALCIUM 8.5* 9.1 9.5   GFR: Estimated Creatinine Clearance: 135.4 mL/min (by C-G formula based on SCr of 0.75 mg/dL). Liver Function Tests: Recent Labs  Lab 12/19/19 0819 12/20/19 0412  AST 18 21  ALT 14 15  ALKPHOS 59 67  BILITOT 0.7 0.6  PROT 7.4 7.8  ALBUMIN 3.6 3.9   No results for input(s): LIPASE, AMYLASE in the last 168 hours. No results for input(s): AMMONIA in the last 168 hours. Coagulation Profile: Recent Labs  Lab 12/19/19 1915  INR 1.2   Cardiac Enzymes: No results for input(s): CKTOTAL, CKMB, CKMBINDEX, TROPONINI in the last 168 hours. BNP (last 3 results) No results for input(s): PROBNP in the last 8760 hours. HbA1C: No results for input(s): HGBA1C in the last 72 hours. CBG:  No results for input(s): GLUCAP in the last 168 hours. Lipid Profile: No results for input(s): CHOL, HDL, LDLCALC, TRIG, CHOLHDL, LDLDIRECT in the last 72 hours. Thyroid Function Tests: No results for input(s): TSH, T4TOTAL, FREET4, T3FREE, THYROIDAB in the last 72 hours. Anemia Panel: No results for input(s): VITAMINB12, FOLATE, FERRITIN, TIBC, IRON, RETICCTPCT in the last 72 hours. Sepsis Labs: Recent Labs  Lab 12/19/19 1915  12/20/19 0412  PROCALCITON  --  <0.10  LATICACIDVEN 0.9  --     Recent Results (from the past 240 hour(s))  Respiratory Panel by RT PCR (Flu A&B, Covid) - Nasopharyngeal Swab     Status: None   Collection Time: 12/19/19  4:22 PM   Specimen: Nasopharyngeal Swab  Result Value Ref Range Status   SARS Coronavirus 2 by RT PCR NEGATIVE NEGATIVE Final    Comment: (NOTE) SARS-CoV-2 target nucleic acids are NOT DETECTED.  The SARS-CoV-2 RNA is generally detectable in upper respiratoy specimens during the acute phase of infection. The lowest concentration of SARS-CoV-2 viral copies this assay can detect is 131 copies/mL. A negative result does not preclude SARS-Cov-2 infection and should not be used as the sole basis for treatment or other patient management decisions. A negative result may occur with  improper specimen collection/handling, submission of specimen other than nasopharyngeal swab, presence of viral mutation(s) within the areas targeted by this assay, and inadequate number of viral copies (<131 copies/mL). A negative result must be combined with clinical observations, patient history, and epidemiological information. The expected result is Negative.  Fact Sheet for Patients:  https://www.moore.com/https://www.fda.gov/media/142436/download  Fact Sheet for Healthcare Providers:  https://www.young.biz/https://www.fda.gov/media/142435/download  This test is no t yet approved or cleared by the [place] FDA and  has been authorized for detection and/or diagnosis of SARS-CoV-2 by FDA under an Emergency Use Authorization (EUA). This EUA will remain  in effect (meaning this test can be used) for the duration of the COVID-19 declaration under Section 564(b)(1) of the Act, 21 U.S.C. section 360bbb-3(b)(1), unless the authorization is terminated or revoked sooner.     Influenza A by PCR NEGATIVE NEGATIVE Final   Influenza B by PCR NEGATIVE NEGATIVE Final    Comment: (NOTE) The Xpert Xpress SARS-CoV-2/FLU/RSV assay is  intended as an aid in  the diagnosis of influenza from Nasopharyngeal swab specimens and  should not be used as a sole basis for treatment. Nasal washings and  aspirates are unacceptable for Xpert Xpress SARS-CoV-2/FLU/RSV  testing.  Fact Sheet for Patients: https://www.moore.com/https://www.fda.gov/media/142436/download  Fact Sheet for Healthcare Providers: https://www.young.biz/https://www.fda.gov/media/142435/download  This test is not yet approved or cleared by the [place] FDA and  has been authorized for detection and/or diagnosis of SARS-CoV-2 by  FDA under an Emergency Use Authorization (EUA). This EUA will remain  in effect (meaning this test can be used) for the duration of the  Covid-19 declaration under Section 564(b)(1) of the Act, 21  U.S.C. section 360bbb-3(b)(1), unless the authorization is  terminated or revoked. Performed at [facility], [address] [place], [place] [address]   Blood culture (routine single)     Status: None (Preliminary result)   Collection Time: 12/19/19  7:15 PM   Specimen: BLOOD  Result Value Ref Range Status   Specimen Description BLOOD LEFT ANTECUBITAL  Final   Special Requests   Final    BOTTLES DRAWN AEROBIC AND ANAEROBIC Blood Culture adequate volume   Culture   Final    NO GROWTH < 12 HOURS Performed at [facility],  [address], [place], [place] [address]    Report Status PENDING  Incomplete         Radiology Studies: CT ANGIO CHEST PE W OR WO CONTRAST  Result Date: 12/19/2019 CLINICAL DATA:  Chest pain.  Shortness of breath. EXAM: CT ANGIOGRAPHY CHEST WITH CONTRAST TECHNIQUE: Multidetector CT imaging of the chest was performed using the standard protocol during bolus administration of intravenous contrast. Multiplanar CT image reconstructions and MIPs were obtained to evaluate the vascular anatomy. CONTRAST:  52mL OMNIPAQUE IOHEXOL 350 MG/ML SOLN COMPARISON:  11/17/2019 FINDINGS: Cardiovascular: The heart size is normal. No substantial  pericardial effusion. No thoracic aortic aneurysm. No large central pulmonary embolus. Trace pulmonary embolus identified in subsegmental pulmonary arteries to the left lower lobe (image 129/series 5 and image 190/series 5). No evidence for acute pulmonary embolus in the right lung. Mediastinum/Nodes: No mediastinal lymphadenopathy. There is no hilar lymphadenopathy. The esophagus has normal imaging features. There is no axillary lymphadenopathy. Lungs/Pleura: There is diffuse patchy and nodular airspace disease involving all lobes of both lungs. Areas of collapse/consolidation are noted in the right lower lobe and there is a small to moderate right pleural effusion. Upper Abdomen: Unremarkable. Musculoskeletal: No worrisome lytic or sclerotic osseous abnormality. Review of the MIP images confirms the above findings. IMPRESSION: 1. Trace pulmonary embolus identified in subsegmental pulmonary arteries to the left lower lobe. No evidence for acute pulmonary embolus in the right lung. 2. Diffuse patchy and nodular airspace disease involving all lobes of both lungs. This is substantially progressed since 11/17/2019 as the patient had no upper lobe or right middle lobe involvement on the prior exam. Areas of collapse/consolidation are noted in the right lower lobe and there is a small to moderate right pleural effusion. Imaging features most likely secondary to infectious/inflammatory etiology. Atypical or viral pneumonia could have this appearance. Critical Value/emergent results were called by me at the time of interpretation on 12/19/2019 at 12:20 pm to provider JENNIFER BURNS , who verbally acknowledged these results. Electronically Signed   By: Kennith Center M.D.   On: 12/19/2019 12:33        Scheduled Meds: . apixaban  5 mg Oral BID  . escitalopram  20 mg Oral Daily  . losartan  50 mg Oral Daily   And  . hydrochlorothiazide  12.5 mg Oral Daily  . methylPREDNISolone (SOLU-MEDROL) injection  40 mg  Intravenous Q12H  . sodium chloride flush  3 mL Intravenous Q12H   Continuous Infusions:  Assessment & Plan:   Principal Problem:   Pleural effusion on right Active Problems:   Pulmonary embolism (HCC)   Community acquired pneumonia, bilateral   Depression with anxiety   HTN (hypertension)   Lyn Ciani Rutten is a 29 y.o. female with medical history significant for hypertension, iron deficiency anemia, depression/anxiety, and history of presumed COVID-19 pneumonia in January-February 2021 (did not require hospitalization, no formal positive Covid test) with recently diagnosed PE who is admitted with right-sided pleural effusion.  Post Covid bilateral pneumonia with right-sided pleural effusion: Had cta -see results above Will consult pccm-spoke to Dr.Fleming Continue IV steroids Procalcitonin < 0.10 no indication for antibiotics BMP normal   Pulmonary emboli: CTA 9/25 showed acute PE of right lower lobe with associated pulmonary infarct.  Repeat CTA 10/27 shows acute trace PE and subsegmental pulmonary arteries to the left lower lobe.  Previous hypercoagulable work-up positive for antiphospholipid antibody syndrome was felt to be post Covid related per hematology. 10/28-plan for thoracentesis Continue on Eliquis  Iron deficiency anemia: We will cycle  No evidence of overt bleeding  Continue to monitor    Essential hypertension: Continue with losartan and HCTZ  Add IV hydralazine as needed  Need to monitor    Depression/anxiety: Continue with Lexapro   DVT prophylaxis: Eliquis Code Status: Full Family Communication: Husband at bedside  Status is: Observation  The patient remains OBS appropriate and will d/c before 2 midnights.  Dispo: The patient is from: Home              Anticipated d/c is to: Home              Anticipated d/c date is: 1 day-2 days              Patient currently is not medically stable to d/c.            LOS: 0 days    Time spent: 35 minutes with more than 50% on COC    Lynn Ito, MD [facility] Pager [phone]  If 7PM-7AM, please contact night-coverage [email or web address] Password [email or web address] 12/20/2019, 9:02 AM

## 2019-12-20 NOTE — Consult Note (Addendum)
Date: 12/20/2019,   MRN# [id number] Alexandra Fleming 1990-11-30 Code Status:     Code Status Orders  (From admission, onward)         Start     Ordered   12/19/19 2006  Do not attempt resuscitation (DNR)  Continuous       Question Answer Comment  In the event of cardiac or respiratory ARREST Do not call a "code blue"   In the event of cardiac or respiratory ARREST Do not perform Intubation, CPR, defibrillation or ACLS   In the event of cardiac or respiratory ARREST Use medication by any route, position, wound care, and other measures to relive pain and suffering. May use oxygen, suction and manual treatment of airway obstruction as needed for comfort.      12/19/19 2008        Code Status History    Date Active Date Inactive Code Status Order ID Comments User Context   11/17/2019 1046 11/21/2019 2358 Full Code [id number]  Lorretta Harp, MD ED   Advance Care Planning Activity      CC: shortness of breath, abnormal chest ct scan  HPI: This is a 29 yr old lady comes in  C/o shortness of breath cough, right chest pain, specks of blood in sputum +/- fever, no calf pain, no bleeding elsewhere.  She is over weight, recently diagnosed with PE, on eliquis.Hypercoagulable work-up showed positive antiphospholipid antibody syndrome/DRVVT.  This was felt post Covid related per hematology and patient was kept on Eliquis anticoagulation. Chest ct angio yesterday did not reveal new pulmonary embolism (s). There was advancing nodular infiltrate, seem more peripheral and a moderate size pleural effusion. S/p thoracentesis. An exudate with increase eosinophila. No pneumothorax..   She denies rashes, joint pain or effusions.   PMHX:   Past Medical History:  Diagnosis Date  . COVID-19 virus infection   . Depression   . HTN (hypertension)    Surgical Hx:  Past Surgical History:  Procedure Laterality Date  . WISDOM TOOTH EXTRACTION     Family Hx:  Family History  Problem Relation Age of  Onset  . Allergies Mother   . Breast cancer Mother 85       DCIS  . High Cholesterol Father   . Hypertension Father   . Sleep apnea Father   . Diabetes Maternal Grandmother   . Hypertension Maternal Grandmother   . COPD Maternal Grandfather   . Thyroid disease Paternal Grandmother   . Hypertension Paternal Grandmother   . Hyperlipidemia Paternal Grandmother   . Sleep apnea Paternal Grandfather   . Hypertension Paternal Grandfather   . Hyperlipidemia Paternal Grandfather   . Hypertension Paternal Aunt   . Anxiety disorder Paternal Aunt   . Hypertension Maternal Uncle    Social Hx:   Social History   Tobacco Use  . Smoking status: Never Smoker  . Smokeless tobacco: Never Used  Substance Use Topics  . Alcohol use: Yes    Alcohol/week: 6.0 standard drinks    Types: 2 Glasses of wine, 4 Cans of beer per week  . Drug use: No   Medication:    Home Medication:    Current Medication: @CURMEDTAB @   Allergies:  Amoxicillin and Penicillins  Review of Systems: Gen:  Denies  fever, sweats, chills HEENT: Denies blurred vision, double vision, ear pain, eye pain, hearing loss, nose bleeds, sore throat Cvc:  No dizziness, chest pain or heaviness Resp:   Much less sob post thoracentesis Gi: Denies swallowing difficulty,  stomach pain, nausea or vomiting, diarrhea, constipation, bowel incontinence Gu:  Denies bladder incontinence, burning urine Ext:   No Joint pain, stiffness or swelling Skin: No skin rash, easy bruising or bleeding or hives Endoc:  No polyuria, polydipsia , polyphagia or weight change Psych: No depression, insomnia or hallucinations  Other:  All other systems negative  Physical Examination:   VS: BP (!) 154/104 (BP Location: Left Arm)   Pulse (!) 108   Temp 97.9 F (36.6 C) (Oral)   Resp 17   Ht 5\' 6"  (1.676 m)   Wt 119.5 kg   SpO2 97%   BMI 42.52 kg/m   General Appearance: No distress  Neuro: without focal findings, mental status, speech normal, alert  and oriented, cranial nerves 2-12 intact, reflexes normal and symmetric, sensation grossly normal  HEENT: PERRLA, EOM intact, no ptosis, no other lesions noticed, Mallampati: Pulmonary:.No wheezing, No rales  Sputum Production:   Cardiovascular:  Normal S1,S2.  No m/r/g.  Abdominal aorta pulsation normal.    Abdomen:Benign, Soft, non-tender, No masses, hepatosplenomegaly, No lymphadenopathy Endoc: No evident thyromegaly, no signs of acromegaly or Cushing features Skin:   warm, no rashes, no ecchymosis  Extremities: normal, no cyanosis, clubbing, no edema, warm with normal capillary refill. Other findings:   Labs results:  Results for Alexandra Fleming, Alexandra STINSON (MRN [id number]) as of 12/20/2019 16:11  Ref. Range 11/17/2019 20:41  Anticardiolipin Ab,IgA,Qn Latest Ref Range: 0 - 11 APL U/mL <9  Anticardiolipin Ab,IgG,Qn Latest Ref Range: 0 - 14 GPL U/mL 83 (H)  Anticardiolipin Ab,IgM,Qn Latest Ref Range: 0 - 12 MPL U/mL 17 (H)  PTT Lupus Anticoagulant Latest Ref Range: 0.0 - 51.9 sec 59.4 (H)  DRVVT Latest Ref Range: 0.0 - 47.0 sec 62.4 (H)  Lupus Anticoag Interp Unknown Comment:  Hexagonal Phase Phospholipid Latest Ref Range: 0 - 11 sec 19 (H)  PTT-LA Mix Latest Ref Range: 0.0 - 48.9 sec 61.5 (H)     Recent Labs    12/19/19 0819 12/19/19 1330 12/19/19 1915 12/20/19 0412  HGB 9.9* 10.0*  --  9.8*  HCT 31.9* 32.1*  --  31.8*  MCV 74.2* 74.7*  --  75.4*  WBC 9.6 9.4  --  9.0  BUN 10 9  --  11  CREATININE 0.82 0.86  --  0.75  GLUCOSE 117* 143*  --  152*  CALCIUM 8.5* 9.1  --  9.5  INR  --   --  1.2  --   ,    No results for input(s): PH in the last 72 hours.  Invalid input(s): PCO2, PO2, BASEEXCESS, BASEDEFICITE, TFT  Culture results:     Rad results:   10/2619     12/20/19 post thoracentesis  IMPRESSION: 1. Trace pulmonary embolus identified in subsegmental pulmonary arteries to the left lower lobe. No evidence for acute pulmonary embolus in the right lung. 2.  Diffuse patchy and nodular airspace disease involving all lobes of both lungs. This is substantially progressed since 11/17/2019 as the patient had no upper lobe or right middle lobe involvement on the prior exam. Areas of collapse/consolidation are noted in the right lower lobe and there is a small to moderate right pleural effusion. Imaging features most likely secondary to infectious/inflammatory etiology. Atypical or viral pneumonia could have this appearance.   DG Chest Port 1 View  Result Date: 12/20/2019 CLINICAL DATA:  Status post right thoracentesis. EXAM: PORTABLE CHEST 1 VIEW COMPARISON:  CTA of the chest on 12/19/2019 and chest x-ray on 11/18/2019.  FINDINGS: The heart size and mediastinal contours are within normal limits. No pneumothorax after thoracentesis. No significant pleural fluid identified. Mild right basilar atelectasis. Patchy bilateral interstitial infiltrates. The visualized skeletal structures are unremarkable. IMPRESSION: No pneumothorax after right-sided thoracentesis. Patchy bilateral interstitial infiltrates. Electronically Signed   By: Irish Lack M.D.   On: 12/20/2019 12:09   US THORACENTESIS ASP PLEURAL SPACE W/IMG GUIDE  Result Date: 12/20/2019 CLINICAL DATA:  Status post right thoracentesis. EXAM: ULTRASOUND GUIDED RIGHT THORACENTESIS COMPARISON:  None. PROCEDURE: An ultrasound guided thoracentesis was thoroughly discussed with the patient and questions answered. The benefits, risks, alternatives and complications were also discussed. The patient understands and wishes to proceed with the procedure. Written consent was obtained. Ultrasound was performed to localize and mark an adequate pocket of fluid in the right chest. The area was then prepped and draped in the normal sterile fashion. 1% Lidocaine was used for local anesthesia. Under ultrasound guidance a 6 French Safe-T-Centesis catheter was introduced. Thoracentesis was performed. The catheter was  removed and a dressing applied. COMPLICATIONS: None FINDINGS: A total of approximately 400 mL of amber fluid was removed. A fluid sample was sent for laboratory analysis. IMPRESSION: Successful ultrasound guided right thoracentesis yielding 400 mL of pleural fluid. Electronically Signed   By: Irish Lack M.D.   On: 12/20/2019 12:05   BLOOD LEFT ANTECUBITAL    Special Requests BOTTLES DRAWN AEROBIC AND ANAEROBIC Blood Culture adequate volume   Culture NO GROWTH < 12 HOURS  Performed at [facility], [address] [place], [place] [address]   Results for Alexandra, Fleming (MRN [id number]) as of 12/20/2019 16:11  Ref. Range 11/30/2019 11:55 12/19/2019 08:19 12/19/2019 13:30 12/19/2019 13:58 12/19/2019 16:22 12/19/2019 19:15 12/20/2019 04:12 12/20/2019 11:54  Glucose, Fluid Latest Units: mg/dL        532  Fluid Type-FGLU Unknown        CYTO PLEU  Fluid Type-FLDH Unknown        CYTO PLEU  LD, Fluid Latest Ref Range: 3 - 23 U/L 196       196 (H)  Total protein, fluid Latest Units: g/dL 5.2       5.2  Fluid Type-FCT Unknown        CYTO PLEU  Fluid Type-FTP Unknown        CYTO PLEU     Results for Alexandra, Fleming (MRN [id number]) as of 12/20/2019 16:11  Ref. Range 12/20/2019 11:54  Color, Fluid Latest Ref Range: YELLOW  AMBER (A)  Total Nucleated Cell Count, Fluid Latest Units: cu mm 2,288  Lymphs, Fluid Latest Units: % 10  Eos, Fluid Latest Units: % 63  Appearance, Fluid Latest Ref Range: CLEAR  HAZY (A)  Other Cells, Fluid Latest Units: % 2  Neutrophil Count, Fluid Latest Units: % 12  Monocyte-Macrophage-Serous Fluid Latest Units: % 13   hiv negative,  Covid negative Influenza negative  Assessment and Plan:  THIS IS A YOUNG LADY, OVER WEIGHT, SHORTNESS OF BREATH, EOSINOPHILIC EXUDATE PLEURAL EFFUSION AND 5-LOBE LOBE PATCHY/NODULAR INFILTRATES, PROGRESSIVE. COVID/HIV NEGATIVE. PULMONARY EMBOLISM NOT THE PRIMARY CAUSE FOR THE SHORTNESS OF BREATH BUT IS A  CAUSE FOR EOSINOPHILIC PLEURAL EFFUSION.   WBC IS NORMAL AND THE PROCALCITONIN IS NORMAL. DOES NOT LOOK INFECTIOUS BUT CANNOT RULE OUT. PNEUMONIAS WITH EOSINOPHILIC EFFUSION INCLUDE BACT, FUNGI, TB, PARASITES ETC. MALIGNANCY IS A CAUSE ALTHOUGH THE HIGHER THE PERCENTAGE OF PLEURAL EOSINOPHILIA, THE LOWER THE CHANCE FOR PULMONARY MALIGNANCIES SUCH AS LYMPHOMA.  DRUG TOXICITIES IS PART OF  THE D/D TOO.    ? 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 TYPICALLY NOT ASSOCIATED WITH PLEURAL EFFUSION (< 10 %). No peripheral eosinophila -ana, ra, anca, complement 3, 4, ace level, fungitell, quantiferon -solumedrol 50 mg tid -follow up on cytology, pleural fluid cultures -continue eliquis   -keep sats > 92 % -further orders per above  Suspect sleep apnea Out patient sleep study    I have personally obtained a history, examined the patient, evaluated laboratory and imaging results, formulated the assessment and plan and placed orders.  The Patient requires high complexity decision making for assessment and support, frequent evaluation and titration of therapies, application of advanced monitoring technologies and extensive interpretation of multiple databases.   Theoplis Garciagarcia,M.D. Pulmonary & Critical care Medicine [facility]

## 2019-12-20 NOTE — Progress Notes (Signed)
Patient transferred to 1A.  Report given to RN.  Patient A+Ox4.  Hypertensive.  Attending aware.  Awaiting orders for PRN BP medication.  All care transferred.

## 2019-12-20 NOTE — Procedures (Signed)
Interventional Radiology Procedure Note  Procedure: US guided right thoracentesis  Complications: None  Estimated Blood Loss: None  Findings: 400 mL of amber fluid removed from right pleural space. Post CXR pending.  Jodi Marble. Fredia Sorrow, M.D Pager:  [phone]

## 2019-12-21 ENCOUNTER — Inpatient Hospital Stay: Payer: BC Managed Care – PPO

## 2019-12-21 LAB — GASTROINTESTINAL PANEL BY PCR, STOOL (REPLACES STOOL CULTURE)

## 2019-12-21 LAB — BASIC METABOLIC PANEL
Anion gap: 15 (ref 5–15)
BUN: 17 mg/dL (ref 6–20)
CO2: 21 mmol/L — ABNORMAL LOW (ref 22–32)
Calcium: 9.1 mg/dL (ref 8.9–10.3)
Chloride: 101 mmol/L (ref 98–111)
Creatinine, Ser: 0.74 mg/dL (ref 0.44–1.00)
GFR, Estimated: >60 mL/min (ref >60)
Glucose, Bld: 145 mg/dL — ABNORMAL HIGH (ref 70–99)
Potassium: 4 mmol/L (ref 3.5–5.1)
Sodium: 137 mmol/L (ref 135–145)

## 2019-12-21 LAB — MAGNESIUM: Magnesium: 2.3 mg/dL (ref 1.7–2.4)

## 2019-12-21 LAB — TSH: TSH: 0.848 u[IU]/mL (ref 0.350–4.500)

## 2019-12-21 MED ORDER — FAMOTIDINE 20 MG PO TABS
20.0000 mg | ORAL_TABLET | Freq: Every day | ORAL | Status: DC
  Administered 2019-12-21 – 2019-12-22 (×2): 20 mg via ORAL
  Filled 2019-12-21 (×2): qty 1

## 2019-12-21 MED ORDER — LOSARTAN POTASSIUM 50 MG PO TABS
50.0000 mg | ORAL_TABLET | Freq: Two times a day (BID) | ORAL | Status: DC
  Administered 2019-12-21 – 2019-12-22 (×2): 50 mg via ORAL
  Filled 2019-12-21 (×2): qty 1

## 2019-12-21 MED ORDER — HYDROCHLOROTHIAZIDE 12.5 MG PO CAPS
12.5000 mg | ORAL_CAPSULE | Freq: Every day | ORAL | Status: DC
  Administered 2019-12-22: 12.5 mg via ORAL
  Filled 2019-12-21: qty 1

## 2019-12-21 MED ORDER — HYDRALAZINE HCL 20 MG/ML IJ SOLN: 10.0000 mg | Freq: Four times a day (QID) | INTRAMUSCULAR | Status: DC | PRN

## 2019-12-21 NOTE — Progress Notes (Signed)
Paged NP Ouma about renewing of telemetry order. HR has been hanging around 110s-120s thus far on this shift.

## 2019-12-21 NOTE — Progress Notes (Addendum)
Date: 12/21/2019,   MRN# [id number] Alexandra Fleming 1990/05/24 Code Status:     Code Status Orders  (From admission, onward)         Start     Ordered   12/19/19 2006  Do not attempt resuscitation (DNR)  Continuous       Question Answer Comment  In the event of cardiac or respiratory ARREST Do not call a "code blue"   In the event of cardiac or respiratory ARREST Do not perform Intubation, CPR, defibrillation or ACLS   In the event of cardiac or respiratory ARREST Use medication by any route, position, wound care, and other measures to relive pain and suffering. May use oxygen, suction and manual treatment of airway obstruction as needed for comfort.      12/19/19 2008        Code Status History    Date Active Date Inactive Code Status Order ID Comments User Context   11/17/2019 1046 11/21/2019 2358 Full Code [id number]  Lorretta Harp, MD ED   Advance Care Planning Activity      HPI: Breathing well, no fever, tachy, w/u in progress. Not needing o2. Answered her questions  PMHX:   Past Medical History:  Diagnosis Date  . COVID-19 virus infection   . Depression   . HTN (hypertension)    Surgical Hx:  Past Surgical History:  Procedure Laterality Date  . WISDOM TOOTH EXTRACTION     Family Hx:  Family History  Problem Relation Age of Onset  . Allergies Mother   . Breast cancer Mother 73       DCIS  . High Cholesterol Father   . Hypertension Father   . Sleep apnea Father   . Diabetes Maternal Grandmother   . Hypertension Maternal Grandmother   . COPD Maternal Grandfather   . Thyroid disease Paternal Grandmother   . Hypertension Paternal Grandmother   . Hyperlipidemia Paternal Grandmother   . Sleep apnea Paternal Grandfather   . Hypertension Paternal Grandfather   . Hyperlipidemia Paternal Grandfather   . Hypertension Paternal Aunt   . Anxiety disorder Paternal Aunt   . Hypertension Maternal Uncle    Social Hx:   Social History   Tobacco Use  . Smoking  status: Never Smoker  . Smokeless tobacco: Never Used  Substance Use Topics  . Alcohol use: Yes    Alcohol/week: 6.0 standard drinks    Types: 2 Glasses of wine, 4 Cans of beer per week  . Drug use: No   Medication:    Home Medication:    Current Medication: @CURMEDTAB @   Allergies:  Amoxicillin and Penicillins  Review of Systems: Gen:  Denies  fever, sweats, chills HEENT: Denies blurred vision, double vision, ear pain, eye pain, hearing loss, nose bleeds, sore throat Cvc:  No dizziness, chest pain or heaviness Resp:  No sob, in bed  Gi: Denies swallowing difficulty, stomach pain, nausea or vomiting, diarrhea, constipation, bowel incontinence Gu:  Denies bladder incontinence, burning urine Ext:   No Joint pain, stiffness or swelling Skin: No skin rash, easy bruising or bleeding or hives Endoc:  No polyuria, polydipsia , polyphagia or weight change Psych: No depression, insomnia or hallucinations  Other:  All other systems negative  Physical Examination:   VS: BP (!) 150/109 (BP Location: Right Arm)   Pulse 89   Temp 98 F (36.7 C) (Oral)   Resp 16   Ht 5\' 6"  (1.676 m)   Wt 119.5 kg   SpO2 96%  BMI 42.52 kg/m   General Appearance: No distress, father at bed side  Neuro: without focal findings, mental status, speech normal, alert and oriented, cranial nerves 2-12 intact, reflexes normal and symmetric, sensation grossly normal  HEENT: PERRLA, EOM intact, no ptosis, no other lesions noticed Pulmonary:.No wheezing, No rales  No rub Cardiovascular:  Normal S1,S2.  No m/r/g.   Abdomen:Benign, Soft, non-tender, No masses,  Endoc: No evident thyromegaly, no signs of acromegaly or Cushing features Skin:   warm, no rashes, no ecchymosis  Extremities: normal, no cyanosis, clubbing, no edema, warm with normal capillary refill. Other findings:   Labs results:   Recent Labs    12/19/19 0819 12/19/19 1330 12/19/19 1915 12/20/19 0412 12/21/19 0556  HGB 9.9* 10.0*  --   9.8*  --   HCT 31.9* 32.1*  --  31.8*  --   MCV 74.2* 74.7*  --  75.4*  --   WBC 9.6 9.4  --  9.0  --   BUN 10 9  --  11 17  CREATININE 0.82 0.86  --  0.75 0.74  GLUCOSE 117* 143*  --  152* 145*  CALCIUM 8.5* 9.1  --  9.5 9.1  INR  --   --  1.2  --   --   ,   Results for Alexandra, Fleming (MRN [id number]) as of 12/21/2019 13:39  Ref. Range 12/20/2019 11:54  Monocyte-Macrophage-Serous Fluid Latest Units: % 13  Other Cells, Fluid Latest Units: % 2  Glucose, Fluid Latest Units: mg/dL 478  Fluid Type-FGLU Unknown CYTO PLEU  Fluid Type-FLDH Unknown CYTO PLEU  LD, Fluid Latest Ref Range: 3 - 23 U/L 196 (H)  Total protein, fluid Latest Units: g/dL 5.2  Fluid Type-FCT Unknown CYTO PLEU  Fluid Type-FTP Unknown CYTO PLEU  Color, Fluid Latest Ref Range: YELLOW  AMBER (A)  Total Nucleated Cell Count, Fluid Latest Units: cu mm 2,288  Lymphs, Fluid Latest Units: % 10  Eos, Fluid Latest Units: % 63  Appearance, Fluid Latest Ref Range: CLEAR  HAZY (A)  Neutrophil Count, Fluid Latest Units: % 12     PLEURAL  Performed at [facility], [address] [place], [place] [address]    Special Requests NONE  Performed at [facility], [address] [place], [place] [address]   Gram Stain RARE WBC PRESENT,BOTH PMN AND MONONUCLEAR  NO ORGANISMS SEEN   Culture NO GROWTH < 24 HOURS    sputum Gram Stain RARE WBC PRESENT, PREDOMINANTLY PMN  MODERATE GRAM POSITIVE COCCI IN PAIRS IN CHAINS  FEW GRAM NEGATIVE RODS  RARE GRAM POSITIVE RODS   Culture ABUNDANT Normal respiratory flora-no Staph aureus or Pseudomonas seen  Performed at [facility], [address] [place], [place] [address]          DG Chest Right Decubitus  Result Date: 12/21/2019 CLINICAL DATA:  Pleural effusion. EXAM: CHEST - RIGHT DECUBITUS COMPARISON:  PA chest x-ray 12/20/2019. FINDINGS: Right-side-down decubitus reveals minimal pleural effusion. No prominent pleural effusion identified. No  pneumothorax. No evidence of loculated fluid collection. IMPRESSION: Minimal right pleural effusion. Electronically Signed   By: Maisie Fus  Register   On: 12/21/2019 12:12  Assessment and Plan: Overall no respiratory distress. Bilateral nodular infiltrates, with pleural effusion the right , exudative, increase eosinophils in the fluid but no peripheral eosinophilia. W/u in progress. The eosinophilic effusion is most likely from the pe/infarct and blood in effusion. Infiltrates to be determine but concern of alveolar hemorrhage, ctd, doubt  drug toxicities, parasitic infection ( tests pending) or malignancy Continue solumedrol Am cxr Follow up on pending labs, cytology etc Continue eliquis Out patient f/u in one week post d/c Home when heart rate is controlled ( hydrate, tsh, mag, limit b2 agonist etc) Following     I have personally obtained a history, examined the patient, evaluated laboratory and imaging results, formulated the assessment and plan and placed orders.  The Patient requires high complexity decision making for assessment and support, frequent evaluation and titration of therapies, application of advanced monitoring technologies and extensive interpretation of multiple databases.   Makyla Bye,M.D. Pulmonary & Critical care Medicine [facility]

## 2019-12-21 NOTE — Progress Notes (Addendum)
PROGRESS NOTE    Alexandra Fleming  CNO:[id number] DOB: [birth year] DOA: 12/19/2019 PCP: Trey Sailors, PA-C    Brief Narrative:  Alexandra Fleming is a 29 y.o. female with medical history significant for hypertension, anemia, depression/anxiety, and history of presumed COVID-19 pneumonia in January-February 2021 (did not require hospitalization, no formal positive Covid test) with recently diagnosed PE who presents to the ED for evaluation of progressive shortness of breath  10/28- plan for thoracentesis today 10/29- has been sinus tachycardia. HR slowly getting better.less anxious today  Consultants:   Pulmonology  Procedures:  cta chest 1. Trace pulmonary embolus identified in subsegmental pulmonary arteries to the left lower lobe. No evidence for acute pulmonary embolus in the right lung. 2. Diffuse patchy and nodular airspace disease involving all lobes of both lungs. This is substantially progressed since 11/17/2019 as the patient had no upper lobe or right middle lobe involvement on the prior exam. Areas of collapse/consolidation are noted in the right lower lobe and there is a small to moderate right pleural effusion. Imaging features most likely secondary to infectious/inflammatory etiology. Atypical or viral pneumonia could have this appearance.  Antimicrobials:       Subjective: Sob less after tap. C/o discomfort at the tap site. No cp. No other cmplaints  Objective: Vitals:   12/20/19 1846 12/20/19 2235 12/21/19 0244 12/21/19 0801  BP: (!) 165/105 (!) 132/92 (!) 144/98 (!) 150/109  Pulse: (!) 110 90 (!) 102 89  Resp: 18 18 18 16   Temp: 97.8 F (36.6 C) 97.9 F (36.6 C) 97.7 F (36.5 C) 98 F (36.7 C)  TempSrc: Oral Oral Oral Oral  SpO2: 95% 98% 95% 96%  Weight:      Height:       No intake or output data in the 24 hours ending 12/21/19 0814 Filed Weights   12/20/19 1452  Weight: 119.5 kg    Examination: Calm, comfortable,  less anxious today Mild coarse breath sounds, more clear than yesterday, no wheezing RRR S1-S2 no murmurs Soft benign positive bowel sounds  no edema Alert oriented x3 grossly intact    Data Reviewed: I have personally reviewed following labs and imaging studies  CBC: Recent Labs  Lab 12/19/19 0819 12/19/19 1330 12/20/19 0412  WBC 9.6 9.4 9.0  NEUTROABS 7.2  --   --   HGB 9.9* 10.0* 9.8*  HCT 31.9* 32.1* 31.8*  MCV 74.2* 74.7* 75.4*  PLT 185 188 208   Basic Metabolic Panel: Recent Labs  Lab 12/19/19 0819 12/19/19 1330 12/20/19 0412 12/21/19 0556  NA 136 135 137 137  K 3.9 3.5 4.3 4.0  CL 103 100 104 101  CO2 23 25 24  21*  GLUCOSE 117* 143* 152* 145*  BUN 10 9 11 17   CREATININE 0.82 0.86 0.75 0.74  CALCIUM 8.5* 9.1 9.5 9.1   GFR: Estimated Creatinine Clearance: 134.1 mL/min (by C-G formula based on SCr of 0.74 mg/dL). Liver Function Tests: Recent Labs  Lab 12/19/19 0819 12/20/19 0412  AST 18 21  ALT 14 15  ALKPHOS 59 67  BILITOT 0.7 0.6  PROT 7.4 7.8  ALBUMIN 3.6 3.9   No results for input(s): LIPASE, AMYLASE in the last 168 hours. No results for input(s): AMMONIA in the last 168 hours. Coagulation Profile: Recent Labs  Lab 12/19/19 1915  INR 1.2   Cardiac Enzymes: No results for input(s): CKTOTAL, CKMB, CKMBINDEX, TROPONINI in the last 168 hours. BNP (last 3 results) No results for input(s): PROBNP in the  last 8760 hours. HbA1C: No results for input(s): HGBA1C in the last 72 hours. CBG: No results for input(s): GLUCAP in the last 168 hours. Lipid Profile: No results for input(s): CHOL, HDL, LDLCALC, TRIG, CHOLHDL, LDLDIRECT in the last 72 hours. Thyroid Function Tests: No results for input(s): TSH, T4TOTAL, FREET4, T3FREE, THYROIDAB in the last 72 hours. Anemia Panel: No results for input(s): VITAMINB12, FOLATE, FERRITIN, TIBC, IRON, RETICCTPCT in the last 72 hours. Sepsis Labs: Recent Labs  Lab 12/19/19 1915 12/20/19 0412  PROCALCITON   --  <0.10  LATICACIDVEN 0.9  --     Recent Results (from the past 240 hour(s))  Respiratory Panel by RT PCR (Flu A&B, Covid) - Nasopharyngeal Swab     Status: None   Collection Time: 12/19/19  4:22 PM   Specimen: Nasopharyngeal Swab  Result Value Ref Range Status   SARS Coronavirus 2 by RT PCR NEGATIVE NEGATIVE Final    Comment: (NOTE) SARS-CoV-2 target nucleic acids are NOT DETECTED.  The SARS-CoV-2 RNA is generally detectable in upper respiratoy specimens during the acute phase of infection. The lowest concentration of SARS-CoV-2 viral copies this assay can detect is 131 copies/mL. A negative result does not preclude SARS-Cov-2 infection and should not be used as the sole basis for treatment or other patient management decisions. A negative result may occur with  improper specimen collection/handling, submission of specimen other than nasopharyngeal swab, presence of viral mutation(s) within the areas targeted by this assay, and inadequate number of viral copies (<131 copies/mL). A negative result must be combined with clinical observations, patient history, and epidemiological information. The expected result is Negative.  Fact Sheet for Patients:  https://www.moore.com/  Fact Sheet for Healthcare Providers:  https://www.young.biz/  This test is no t yet approved or cleared by the [place] FDA and  has been authorized for detection and/or diagnosis of SARS-CoV-2 by FDA under an Emergency Use Authorization (EUA). This EUA will remain  in effect (meaning this test can be used) for the duration of the COVID-19 declaration under Section 564(b)(1) of the Act, 21 U.S.C. section 360bbb-3(b)(1), unless the authorization is terminated or revoked sooner.     Influenza A by PCR NEGATIVE NEGATIVE Final   Influenza B by PCR NEGATIVE NEGATIVE Final    Comment: (NOTE) The Xpert Xpress SARS-CoV-2/FLU/RSV assay is intended as an aid in  the  diagnosis of influenza from Nasopharyngeal swab specimens and  should not be used as a sole basis for treatment. Nasal washings and  aspirates are unacceptable for Xpert Xpress SARS-CoV-2/FLU/RSV  testing.  Fact Sheet for Patients: https://www.moore.com/  Fact Sheet for Healthcare Providers: https://www.young.biz/  This test is not yet approved or cleared by the [place] FDA and  has been authorized for detection and/or diagnosis of SARS-CoV-2 by  FDA under an Emergency Use Authorization (EUA). This EUA will remain  in effect (meaning this test can be used) for the duration of the  Covid-19 declaration under Section 564(b)(1) of the Act, 21  U.S.C. section 360bbb-3(b)(1), unless the authorization is  terminated or revoked. Performed at [facility], [address] [place], [place] [address]   Blood culture (routine single)     Status: None (Preliminary result)   Collection Time: 12/19/19  7:15 PM   Specimen: BLOOD  Result Value Ref Range Status   Specimen Description BLOOD LEFT ANTECUBITAL  Final   Special Requests   Final    BOTTLES DRAWN AEROBIC AND ANAEROBIC Blood Culture adequate volume   Culture  Final    NO GROWTH 2 DAYS Performed at [facility], [address] [place], [place] [address]    Report Status PENDING  Incomplete  Body fluid culture     Status: None (Preliminary result)   Collection Time: 12/20/19 11:54 AM   Specimen: PATH Cytology Pleural fluid  Result Value Ref Range Status   Specimen Description   Final    PLEURAL Performed at [facility], [address] [place], [place] [address]    Special Requests   Final    NONE Performed at [facility], [address] [place], [place] [address]    Gram Stain   Final    RARE WBC PRESENT,BOTH PMN AND MONONUCLEAR NO ORGANISMS SEEN    Culture   Final    NO GROWTH < 24 HOURS Performed at [facility], [address], [place], [place] [address]    Report Status PENDING  Incomplete         Radiology Studies: CT ANGIO CHEST PE W OR WO CONTRAST  Result Date: 12/19/2019 CLINICAL DATA:  Chest pain.  Shortness of breath. EXAM: CT ANGIOGRAPHY CHEST WITH CONTRAST TECHNIQUE: Multidetector CT imaging of the chest was performed using the standard protocol during bolus administration of intravenous contrast. Multiplanar CT image reconstructions and MIPs were obtained to evaluate the vascular anatomy. CONTRAST:  42mL OMNIPAQUE IOHEXOL 350 MG/ML SOLN COMPARISON:  11/17/2019 FINDINGS: Cardiovascular: The heart size is normal. No substantial pericardial effusion. No thoracic aortic aneurysm. No large central pulmonary embolus. Trace pulmonary embolus identified in subsegmental pulmonary arteries to the left lower lobe (image 129/series 5 and image 190/series 5). No evidence for acute pulmonary embolus in the right lung. Mediastinum/Nodes: No mediastinal lymphadenopathy. There is no hilar lymphadenopathy. The esophagus has normal imaging features. There is no axillary lymphadenopathy. Lungs/Pleura: There is diffuse patchy and nodular airspace disease involving all lobes of both lungs. Areas of collapse/consolidation are noted in the right lower lobe and there is a small to moderate right pleural effusion. Upper Abdomen: Unremarkable. Musculoskeletal: No worrisome lytic or sclerotic osseous abnormality. Review of the MIP images confirms the above findings. IMPRESSION: 1. Trace pulmonary embolus identified in subsegmental pulmonary arteries to the left lower lobe. No evidence for acute pulmonary embolus in the right lung. 2. Diffuse patchy and nodular airspace disease involving all lobes of both lungs. This is substantially progressed since 11/17/2019 as the patient had no upper lobe or right middle lobe involvement on the prior exam. Areas of collapse/consolidation are noted in the right lower lobe and there is a small to moderate  right pleural effusion. Imaging features most likely secondary to infectious/inflammatory etiology. Atypical or viral pneumonia could have this appearance. Critical Value/emergent results were called by me at the time of interpretation on 12/19/2019 at 12:20 pm to provider JENNIFER BURNS , who verbally acknowledged these results. Electronically Signed   By: Kennith Center M.D.   On: 12/19/2019 12:33   DG Chest Port 1 View  Result Date: 12/20/2019 CLINICAL DATA:  Status post right thoracentesis. EXAM: PORTABLE CHEST 1 VIEW COMPARISON:  CTA of the chest on 12/19/2019 and chest x-ray on 11/18/2019. FINDINGS: The heart size and mediastinal contours are within normal limits. No pneumothorax after thoracentesis. No significant pleural fluid identified. Mild right basilar atelectasis. Patchy bilateral interstitial infiltrates. The visualized skeletal structures are unremarkable. IMPRESSION: No pneumothorax after right-sided thoracentesis. Patchy bilateral interstitial infiltrates. Electronically Signed   By: Irish Lack M.D.   On: 12/20/2019  12:09   US THORACENTESIS ASP PLEURAL SPACE W/IMG GUIDE  Result Date: 12/20/2019 CLINICAL DATA:  Status post right thoracentesis. EXAM: ULTRASOUND GUIDED RIGHT THORACENTESIS COMPARISON:  None. PROCEDURE: An ultrasound guided thoracentesis was thoroughly discussed with the patient and questions answered. The benefits, risks, alternatives and complications were also discussed. The patient understands and wishes to proceed with the procedure. Written consent was obtained. Ultrasound was performed to localize and mark an adequate pocket of fluid in the right chest. The area was then prepped and draped in the normal sterile fashion. 1% Lidocaine was used for local anesthesia. Under ultrasound guidance a 6 French Safe-T-Centesis catheter was introduced. Thoracentesis was performed. The catheter was removed and a dressing applied. COMPLICATIONS: None FINDINGS: A total of  approximately 400 mL of amber fluid was removed. A fluid sample was sent for laboratory analysis. IMPRESSION: Successful ultrasound guided right thoracentesis yielding 400 mL of pleural fluid. Electronically Signed   By: Irish LackGlenn  Yamagata M.D.   On: 12/20/2019 12:05        Scheduled Meds:  apixaban  5 mg Oral BID   escitalopram  20 mg Oral Daily   hydrALAZINE  10 mg Oral Q6H   losartan  50 mg Oral Daily   And   hydrochlorothiazide  12.5 mg Oral Daily   methylPREDNISolone (SOLU-MEDROL) injection  40 mg Intravenous Q12H   sodium chloride flush  3 mL Intravenous Q12H   Continuous Infusions:  Assessment & Plan:   Principal Problem:   Pleural effusion on right Active Problems:   Pulmonary embolism (HCC)   Community acquired pneumonia, bilateral   Depression with anxiety   HTN (hypertension)   Alexandra Fleming is a 29 y.o. female with medical history significant for hypertension, iron deficiency anemia, depression/anxiety, and history of presumed COVID-19 pneumonia in January-February 2021 (did not require hospitalization, no formal positive Covid test) with recently diagnosed PE who is admitted with right-sided pleural effusion.  Post Covid bilateral pneumonia with right-sided pleural effusion: With increased dyspnea on exertion and CTA findings (see results) Pulmonary following input was appreciated, culture and cytology have been sent Status post thoracentesis with some improvement of her symptoms-exudate with increased eosinophilia.  Post chest x-ray no pneumothorax Procalcitonin < 0.10 no indication for antibiotics We will continue on IV steroids  Pulmonary emboli: CTA 9/25 showed acute PE of right lower lobe with associated pulmonary infarct.  Repeat CTA 10/27 shows acute trace PE and subsegmental pulmonary arteries to the left lower lobe.  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 10/29-continue with Eliquis   Iron deficiency anemia: Possibly due to her menstrual cycle No overt signs of bleeding Continue to monitor  Essential hypertension: BP still elevated  Increase losartan to 50 mg twice daily  Continue HCTZ  Continue with hydralazine iv Needs sleep study as outpt for osa assessment as it also can cause htn    Depression/anxiety: Continue with Lexapro  Sinus tachycardia-likely multifactorial From lung issue, PE, anxiety and possibly steroids We will continue to monitor.  Would not place on beta-blockers as treatment of choices treating underlying cause which in this case is multiple   DVT prophylaxis: Eliquis Code Status: Full Family Communication: none at bedside  Status is: inpatient Patient is appropriate for inpatient as she needs iv treatment for the degree of illness. Dispo: The patient is from: Home              Anticipated d/c is to:  Home              Anticipated d/c date is: 1 day-              Patient currently is not medically stable to d/c.needs iv treatment, HR better control.             LOS: 1 day   Time spent: 45 minutes with more than 50% on COC    Lynn Ito, MD [facility] Pager [phone]  If 7PM-7AM, please contact night-coverage [email or web address] Password [email or web address] 12/21/2019, 8:14 AM

## 2019-12-21 NOTE — Plan of Care (Signed)
  Problem: Clinical Measurements: Goal: Ability to maintain clinical measurements within normal limits will improve Outcome: Progressing   Problem: Education: Goal: Knowledge of General Education information will improve Description: Including pain rating scale, medication(s)/side effects and non-pharmacologic comfort measures Outcome: Progressing   

## 2019-12-22 ENCOUNTER — Inpatient Hospital Stay: Payer: BC Managed Care – PPO

## 2019-12-22 LAB — ANGIOTENSIN CONVERTING ENZYME: Angiotensin-Converting Enzyme: 31 U/L (ref 14–82)

## 2019-12-22 LAB — C4 COMPLEMENT: Complement C4, Body Fluid: 31 mg/dL (ref 12–38)

## 2019-12-22 LAB — RHEUMATOID FACTOR: Rheumatoid fact SerPl-aCnc: <10 IU/mL (ref 0.0–13.9)

## 2019-12-22 LAB — BASIC METABOLIC PANEL
Anion gap: 11 (ref 5–15)
BUN: 24 mg/dL — ABNORMAL HIGH (ref 6–20)
CO2: 24 mmol/L (ref 22–32)
Calcium: 9.3 mg/dL (ref 8.9–10.3)
Chloride: 103 mmol/L (ref 98–111)
Creatinine, Ser: 0.76 mg/dL (ref 0.44–1.00)
GFR, Estimated: >60 mL/min (ref >60)
Glucose, Bld: 120 mg/dL — ABNORMAL HIGH (ref 70–99)
Potassium: 4.3 mmol/L (ref 3.5–5.1)
Sodium: 138 mmol/L (ref 135–145)

## 2019-12-22 LAB — C3 COMPLEMENT: C3 Complement: 199 mg/dL — ABNORMAL HIGH (ref 82–167)

## 2019-12-22 MED ORDER — PREDNISONE 20 MG PO TABS: 30.0000 mg | ORAL_TABLET | Freq: Every day | ORAL | Status: DC

## 2019-12-22 MED ORDER — PREDNISONE 10 MG PO TABS: 30.0000 mg | ORAL_TABLET | Freq: Every day | ORAL | 0 refills | Status: AC

## 2019-12-22 MED ORDER — LOSARTAN POTASSIUM 50 MG PO TABS
50.0000 mg | ORAL_TABLET | Freq: Every day | ORAL | 0 refills | Status: DC
Start: 2019-12-22 — End: 2020-01-21

## 2019-12-22 MED ORDER — FAMOTIDINE 20 MG PO TABS: 20.0000 mg | ORAL_TABLET | Freq: Every day | ORAL | 0 refills | Status: DC

## 2019-12-22 NOTE — Discharge Summary (Signed)
Alexandra Fleming MGQ:[id number] DOB: 12-Jun-1990 DOA: 12/19/2019  PCP: Trey Sailors, PA-C  Admit date: 12/19/2019 Discharge date: 12/22/2019  Admitted From: Home Disposition: Home  Recommendations for Outpatient Follow-up:  1. Follow up with PCP in 1 week 2. Please obtain BMP/CBC in one week 3. Pulmonary Dr. Meredeth Ide next Thursday 4. F/u with hematology Dr. Donneta Romberg in one week     Discharge Condition:Stable CODE STATUS: Full Diet recommendation: Heart Healthy  Brief/Interim Summary: Alexandra Fleming is a 29 y.o. female with medical history significant for hypertension, anemia, depression/anxiety, and history of presumed COVID-19 pneumonia in January-February 2021 (did not require hospitalization, no formal positive Covid test) with recently diagnosed PE who presents to the ED for evaluation of progressive shortness of breath.Patient was recently hospitalized 9/25-9/29/21 for acute right lower lobe PE with associated pulmonary infarct.  Was discharged on Eliquis.She was treated with empiric antibiotics for possible pneumonia at that time as well.  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  This was felt post Covid related per hematology and patient was kept on Eliquis anticoagulation.Patient was seen in follow-up in hematology clinic (12/19/2019) with complaint of continued shortness of breath and right-sided back and chest pain. A follow-up CTA chest PE study was obtained which showed progressive diffuse patchy nodular airspace disease involving all lobes of both lungs, markedly increased compared to prior with area of collapse/consolidation noted in the right lower lobe with moderate right sided pleural effusion.  Trace PE also identified and subsegmental pulmonary arteries to the left lower lobe.Patient was notified of the results and advised to present to the ED for further evaluation and management.Patient states that she did  miss 2 days of Eliquis (10/24 and 10/25) due to running out of her medications before being able to refill her prescription which she has now restarted.  SARS-CoV-2 PCR is negative.  Influenza A/B PCR's are negative. Patient was admitted to the hospital service.  Pulmonology was consulted.  During her hospitalization patient has sinus tachycardia.  She underwent right thoracentesis for her right pleural effusion.  She was started on IV steroids.  Post Covid bilateral pneumonia with right-sided pleural effusion: With increased dyspnea on exertion and CTA findings  Pulmonary following-spoke to Dr. Meredeth Ide today he advised patient to be discharged on prednisone 30 mg daily until she can follow-up next week and at that time he will reevaluate steroid use.   Need to follow-up on pleural culture cytology.   Status post thoracentesis with some improvement of her symptoms-exudate with increased eosinophilia.  Post chest x-ray no pneumothorax Procalcitonin < 0.10 no indication for antibiotics during her hospitalization Chest x-ray this a.m. looks improved Needs sleep study for evaluation of OSA   Pulmonary emboli: Continue with Eliquis Patient was counseled extensively about not missing any Eliquis doses We will need to follow-up with hematology next week  Avoid contraceptive medications  iron deficiency anemia: Possibly due to her menstrual cycle No overt signs of bleeding F/u with pcp/hematology for further management   Essential hypertension: Was elevated , added losartan 50mg  qhs to her regular regimen    Depression/anxiety: Continue on home meds  Sinus tachycardia-likely multifactorial From lung issue, PE, anxiety and possibly steroids Improved over all.   Discharge Diagnoses:  Principal Problem:   Pleural effusion on right Active Problems:   Pulmonary embolism (HCC)   Community acquired pneumonia, bilateral   Depression with anxiety   HTN (hypertension)    Discharge  Instructions  Discharge Instructions    Call MD for:  difficulty breathing, headache or visual disturbances   Complete by: As directed    Call MD for:  temperature >100.4   Complete by: As directed    Diet - low sodium heart healthy   Complete by: As directed    Discharge instructions   Complete by: As directed    Follow up with Dr. Meredeth IdeFleming next Thursday Make sure follow up with sleep study I added losartan 50mg  to take at bedtime   Increase activity slowly   Complete by: As directed      Allergies as of 12/22/2019      Reactions   Amoxicillin    Penicillins       Medication List    STOP taking these medications   levonorgestrel 20 MCG/24HR IUD Commonly known as: MIRENA   predniSONE 10 MG (21) Tbpk tablet Commonly known as: STERAPRED UNI-PAK 21 TAB Replaced by: predniSONE 10 MG tablet     TAKE these medications   apixaban 5 MG Tabs tablet Commonly known as: Eliquis Take 2 tablets (10mg ) twice daily for 7 days, then 1 tablet (5mg ) twice daily   chlorpheniramine-HYDROcodone 10-8 MG/5ML Suer Commonly known as: TUSSIONEX Take 5 mLs by mouth every 12 (twelve) hours as needed for cough.   escitalopram 20 MG tablet Commonly known as: LEXAPRO TAKE 1 TABLET(20 MG) BY MOUTH DAILY   famotidine 20 MG tablet Commonly known as: PEPCID Take 1 tablet (20 mg total) by mouth daily. Start taking on: December 23, 2019   hydrOXYzine 10 MG tablet Commonly known as: ATARAX/VISTARIL Take 10 mg by mouth 3 (three) times daily as needed.   losartan 50 MG tablet Commonly known as: COZAAR Take 1 tablet (50 mg total) by mouth at bedtime.   losartan-hydrochlorothiazide 50-12.5 MG tablet Commonly known as: HYZAAR TAKE 1 TABLET BY MOUTH DAILY   predniSONE 10 MG tablet Commonly known as: DELTASONE Take 3 tablets (30 mg total) by mouth daily with breakfast for 7 days. Start taking on: December 23, 2019 Replaces: predniSONE 10 MG (21) Tbpk tablet       Follow-up Information     Alexandra Fleming, Herbon E, MD Follow up in 1 week(s).   Specialty: Specialist Why: f/u for thursday per dr. Meredeth Idefleming . please make appointmnt Contact information: [address] [place] [address] [phone]        Trey SailorsPollak, Adriana M, PA-C Follow up in 1 week(s).   Specialty: Physician Assistant Contact information: [address] [place] [address] [phone]        Earna CoderBrahmanday, Govinda R, MD Follow up in 1 week(s).   Specialties: Internal Medicine, Oncology Contact information: [address] [place] [address] [phone]              Allergies  Allergen Reactions  . Amoxicillin   . Penicillins     Consultations:  Pulmonary   Procedures/Studies: CT ANGIO CHEST PE W OR WO CONTRAST  Result Date: 12/19/2019 CLINICAL DATA:  Chest pain.  Shortness of breath. EXAM: CT ANGIOGRAPHY CHEST WITH CONTRAST TECHNIQUE: Multidetector CT imaging of the chest was performed using the standard protocol during bolus administration of intravenous contrast. Multiplanar CT image reconstructions and MIPs were obtained to evaluate the vascular anatomy. CONTRAST:  75mL OMNIPAQUE IOHEXOL 350 MG/ML SOLN COMPARISON:  11/17/2019 FINDINGS: Cardiovascular: The heart size is normal. No substantial pericardial effusion. No thoracic aortic aneurysm. No large central pulmonary embolus. Trace pulmonary embolus identified in subsegmental pulmonary arteries to the left lower lobe (image 129/series 5 and  image 190/series 5). No evidence for acute pulmonary embolus in the right lung. Mediastinum/Nodes: No mediastinal lymphadenopathy. There is no hilar lymphadenopathy. The esophagus has normal imaging features. There is no axillary lymphadenopathy. Lungs/Pleura: There is diffuse patchy and nodular airspace disease involving all lobes of both lungs. Areas of collapse/consolidation are noted in the right lower lobe and there is a small to moderate right pleural effusion. Upper  Abdomen: Unremarkable. Musculoskeletal: No worrisome lytic or sclerotic osseous abnormality. Review of the MIP images confirms the above findings. IMPRESSION: 1. Trace pulmonary embolus identified in subsegmental pulmonary arteries to the left lower lobe. No evidence for acute pulmonary embolus in the right lung. 2. Diffuse patchy and nodular airspace disease involving all lobes of both lungs. This is substantially progressed since 11/17/2019 as the patient had no upper lobe or right middle lobe involvement on the prior exam. Areas of collapse/consolidation are noted in the right lower lobe and there is a small to moderate right pleural effusion. Imaging features most likely secondary to infectious/inflammatory etiology. Atypical or viral pneumonia could have this appearance. Critical Value/emergent results were called by me at the time of interpretation on 12/19/2019 at 12:20 pm to provider JENNIFER BURNS , who verbally acknowledged these results. Electronically Signed   By: Kennith Center M.D.   On: 12/19/2019 12:33   DG Chest Right Decubitus  Result Date: 12/21/2019 CLINICAL DATA:  Pleural effusion. EXAM: CHEST - RIGHT DECUBITUS COMPARISON:  PA chest x-ray 12/20/2019. FINDINGS: Right-side-down decubitus reveals minimal pleural effusion. No prominent pleural effusion identified. No pneumothorax. No evidence of loculated fluid collection. IMPRESSION: Minimal right pleural effusion. Electronically Signed   By: Maisie Fus  Register   On: 12/21/2019 12:12   DG Chest Port 1 View  Result Date: 12/22/2019 CLINICAL DATA:  Follow-up for pleural effusion. History of COVID-19 infection. EXAM: PORTABLE CHEST 1 VIEW COMPARISON:  12/21/2019, 12/20/2019 and earlier studies. FINDINGS: Small right pleural effusion. Lungs appear clear. The lung opacities noted on the CT of the chest from 12/19/2019 are not appreciated on the portable chest radiograph. No pneumothorax. Heart, mediastinum and hila are unremarkable. IMPRESSION:  1. Lungs appear clear radiographically. The ground-glass lung opacities noted on the recent chest CT are likely not visible on the portable radiograph as opposed to being resolved. 2. Small right pleural effusion, not well-defined on the AP portable chest radiograph. Electronically Signed   By: Amie Portland M.D.   On: 12/22/2019 08:05   DG Chest Port 1 View  Result Date: 12/20/2019 CLINICAL DATA:  Status post right thoracentesis. EXAM: PORTABLE CHEST 1 VIEW COMPARISON:  CTA of the chest on 12/19/2019 and chest x-ray on 11/18/2019. FINDINGS: The heart size and mediastinal contours are within normal limits. No pneumothorax after thoracentesis. No significant pleural fluid identified. Mild right basilar atelectasis. Patchy bilateral interstitial infiltrates. The visualized skeletal structures are unremarkable. IMPRESSION: No pneumothorax after right-sided thoracentesis. Patchy bilateral interstitial infiltrates. Electronically Signed   By: Irish Lack M.D.   On: 12/20/2019 12:09   US THORACENTESIS ASP PLEURAL SPACE W/IMG GUIDE  Result Date: 12/20/2019 CLINICAL DATA:  Status post right thoracentesis. EXAM: ULTRASOUND GUIDED RIGHT THORACENTESIS COMPARISON:  None. PROCEDURE: An ultrasound guided thoracentesis was thoroughly discussed with the patient and questions answered. The benefits, risks, alternatives and complications were also discussed. The patient understands and wishes to proceed with the procedure. Written consent was obtained. Ultrasound was performed to localize and mark an adequate pocket of fluid in the right chest. The area was then prepped and draped in  the normal sterile fashion. 1% Lidocaine was used for local anesthesia. Under ultrasound guidance a 6 French Safe-T-Centesis catheter was introduced. Thoracentesis was performed. The catheter was removed and a dressing applied. COMPLICATIONS: None FINDINGS: A total of approximately 400 mL of amber fluid was removed. A fluid sample was sent  for laboratory analysis. IMPRESSION: Successful ultrasound guided right thoracentesis yielding 400 mL of pleural fluid. Electronically Signed   By: Irish Lack M.D.   On: 12/20/2019 12:05      Subjective: Mild soreness at the site of thoracentesis.  No chest pain or shortness of breath.  Overall feels much better.  Discharge Exam: Vitals:   12/22/19 0006 12/22/19 0808  BP: 127/90 (!) 143/105  Pulse: 92 96  Resp: 18 17  Temp: 97.9 F (36.6 C) (!) 97.5 F (36.4 C)  SpO2: 96% 96%   Vitals:   12/21/19 0801 12/21/19 1543 12/22/19 0006 12/22/19 0808  BP: (!) 150/109 (!) 147/97 127/90 (!) 143/105  Pulse: 89 81 92 96  Resp: 16 16 18 17   Temp: 98 F (36.7 C) (!) 97.5 F (36.4 C) 97.9 F (36.6 C) (!) 97.5 F (36.4 C)  TempSrc: Oral Oral Oral Oral  SpO2: 96% 100% 96% 96%  Weight:      Height:        General: Pt is alert, awake, not in acute distress Cardiovascular: RRR, S1/S2 +, no rubs, no gallops Respiratory: CTA bilaterally, no wheezing, no rhonchi Abdominal: Soft, NT, ND, bowel sounds + Extremities: no edema, no cyanosis    The results of significant diagnostics from this hospitalization (including imaging, microbiology, ancillary and laboratory) are listed below for reference.     Microbiology: Recent Results (from the past 240 hour(s))  Respiratory Panel by RT PCR (Flu A&B, Covid) - Nasopharyngeal Swab     Status: None   Collection Time: 12/19/19  4:22 PM   Specimen: Nasopharyngeal Swab  Result Value Ref Range Status   SARS Coronavirus 2 by RT PCR NEGATIVE NEGATIVE Final    Comment: (NOTE) SARS-CoV-2 target nucleic acids are NOT DETECTED.  The SARS-CoV-2 RNA is generally detectable in upper respiratoy specimens during the acute phase of infection. The lowest concentration of SARS-CoV-2 viral copies this assay can detect is 131 copies/mL. A negative result does not preclude SARS-Cov-2 infection and should not be used as the sole basis for treatment or other  patient management decisions. A negative result may occur with  improper specimen collection/handling, submission of specimen other than nasopharyngeal swab, presence of viral mutation(s) within the areas targeted by this assay, and inadequate number of viral copies (<131 copies/mL). A negative result must be combined with clinical observations, patient history, and epidemiological information. The expected result is Negative.  Fact Sheet for Patients:  12/21/19  Fact Sheet for Healthcare Providers:  https://www.moore.com/  This test is no t yet approved or cleared by the https://www.young.biz/ FDA and  has been authorized for detection and/or diagnosis of SARS-CoV-2 by FDA under an Emergency Use Authorization (EUA). This EUA will remain  in effect (meaning this test can be used) for the duration of the COVID-19 declaration under Section 564(b)(1) of the Act, 21 U.S.C. section 360bbb-3(b)(1), unless the authorization is terminated or revoked sooner.     Influenza A by PCR NEGATIVE NEGATIVE Final   Influenza B by PCR NEGATIVE NEGATIVE Final    Comment: (NOTE) The Xpert Xpress SARS-CoV-2/FLU/RSV assay is intended as an aid in  the diagnosis of influenza from Nasopharyngeal swab specimens and  should not be used as a sole basis for treatment. Nasal washings and  aspirates are unacceptable for Xpert Xpress SARS-CoV-2/FLU/RSV  testing.  Fact Sheet for Patients: https://www.moore.com/  Fact Sheet for Healthcare Providers: https://www.young.biz/  This test is not yet approved or cleared by the [place] FDA and  has been authorized for detection and/or diagnosis of SARS-CoV-2 by  FDA under an Emergency Use Authorization (EUA). This EUA will remain  in effect (meaning this test can be used) for the duration of the  Covid-19 declaration under Section 564(b)(1) of the Act, 21  U.S.C. section  360bbb-3(b)(1), unless the authorization is  terminated or revoked. Performed at [facility], [address] [place], [place] [address]   Blood culture (routine single)     Status: None (Preliminary result)   Collection Time: 12/19/19  7:15 PM   Specimen: BLOOD  Result Value Ref Range Status   Specimen Description BLOOD LEFT ANTECUBITAL  Final   Special Requests   Final    BOTTLES DRAWN AEROBIC AND ANAEROBIC Blood Culture adequate volume   Culture   Final    NO GROWTH 3 DAYS Performed at [facility], [address] [place], [place] [address]    Report Status PENDING  Incomplete  Body fluid culture     Status: None (Preliminary result)   Collection Time: 12/20/19 11:54 AM   Specimen: PATH Cytology Pleural fluid  Result Value Ref Range Status   Specimen Description   Final    PLEURAL Performed at [facility], [address], [place], [place] [address]    Special Requests   Final    NONE Performed at [facility], [address] [place], [place] [address]    Gram Stain   Final    RARE WBC PRESENT,BOTH PMN AND MONONUCLEAR NO ORGANISMS SEEN    Culture   Final    NO GROWTH 2 DAYS Performed at [facility], [address], [place], [place] [address]    Report Status PENDING  Incomplete  Gastrointestinal Panel by PCR , Stool     Status: None   Collection Time: 12/21/19 11:29 AM   Specimen: Rectum; Stool  Result Value Ref Range Status   Campylobacter species NOT DETECTED NOT DETECTED Final   Plesimonas shigelloides NOT DETECTED NOT DETECTED Final   Salmonella species NOT DETECTED NOT DETECTED Final   Yersinia enterocolitica NOT DETECTED NOT DETECTED Final   Vibrio species NOT DETECTED NOT DETECTED Final   Vibrio cholerae NOT DETECTED NOT DETECTED Final   Enteroaggregative E coli (EAEC) NOT DETECTED NOT DETECTED Final   Enteropathogenic E coli (EPEC) NOT DETECTED NOT DETECTED Final   Enterotoxigenic E coli (ETEC) NOT DETECTED  NOT DETECTED Final   Shiga like toxin producing E coli (STEC) NOT DETECTED NOT DETECTED Final   Shigella/Enteroinvasive E coli (EIEC) NOT DETECTED NOT DETECTED Final   Cryptosporidium NOT DETECTED NOT DETECTED Final   Cyclospora cayetanensis NOT DETECTED NOT DETECTED Final   Entamoeba histolytica NOT DETECTED NOT DETECTED Final   Giardia lamblia NOT DETECTED NOT DETECTED Final   Adenovirus F40/41 NOT DETECTED NOT DETECTED Final   Astrovirus NOT DETECTED NOT DETECTED Final   Norovirus GI/GII NOT DETECTED NOT DETECTED Final   Rotavirus A NOT DETECTED NOT DETECTED Final   Sapovirus (I, II, IV, and V) NOT DETECTED NOT DETECTED Final    Comment: Performed at [facility], [address] [place], [place] [address]     Labs: BNP (last  3 results) Recent Labs    12/20/19 0412  BNP 69.7   Basic Metabolic Panel: Recent Labs  Lab 12/19/19 0819 12/19/19 1330 12/20/19 0412 12/21/19 0556 12/21/19 1415 12/22/19 0740  NA 136 135 137 137  --  138  K 3.9 3.5 4.3 4.0  --  4.3  CL 103 100 104 101  --  103  CO2 21*  --  24  GLUCOSE 117* 143* 152* 145*  --  120*  BUN --  24*  CREATININE 0.82 0.86 0.75 0.74  --  0.76  CALCIUM 8.5* 9.1 9.5 9.1  --  9.3  MG  --   --   --   --  2.3  --    Liver Function Tests: Recent Labs  Lab 12/19/19 0819 12/20/19 0412  AST 18 21  ALT 14 15  ALKPHOS 59 67  BILITOT 0.7 0.6  PROT 7.4 7.8  ALBUMIN 3.6 3.9   No results for input(s): LIPASE, AMYLASE in the last 168 hours. No results for input(s): AMMONIA in the last 168 hours. CBC: Recent Labs  Lab 12/19/19 0819 12/19/19 1330 12/20/19 0412  WBC 9.6 9.4 9.0  NEUTROABS 7.2  --   --   HGB 9.9* 10.0* 9.8*  HCT 31.9* 32.1* 31.8*  MCV 74.2* 74.7* 75.4*  PLT 185 188 208   Cardiac Enzymes: No results for input(s): CKTOTAL, CKMB, CKMBINDEX, TROPONINI in the last 168 hours. BNP: Invalid input(s): POCBNP CBG: No results for input(s): GLUCAP in the last 168  hours. D-Dimer No results for input(s): DDIMER in the last 72 hours. Hgb A1c No results for input(s): HGBA1C in the last 72 hours. Lipid Profile No results for input(s): CHOL, HDL, LDLCALC, TRIG, CHOLHDL, LDLDIRECT in the last 72 hours. Thyroid function studies Recent Labs    12/21/19 1415  TSH 0.848   Anemia work up No results for input(s): VITAMINB12, FOLATE, FERRITIN, TIBC, IRON, RETICCTPCT in the last 72 hours. Urinalysis No results found for: COLORURINE, APPEARANCEUR, LABSPEC, PHURINE, GLUCOSEU, HGBUR, BILIRUBINUR, KETONESUR, PROTEINUR, UROBILINOGEN, NITRITE, LEUKOCYTESUR Sepsis Labs Invalid input(s): PROCALCITONIN,  WBC,  LACTICIDVEN Microbiology Recent Results (from the past 240 hour(s))  Respiratory Panel by RT PCR (Flu A&B, Covid) - Nasopharyngeal Swab     Status: None   Collection Time: 12/19/19  4:22 PM   Specimen: Nasopharyngeal Swab  Result Value Ref Range Status   SARS Coronavirus 2 by RT PCR NEGATIVE NEGATIVE Final    Comment: (NOTE) SARS-CoV-2 target nucleic acids are NOT DETECTED.  The SARS-CoV-2 RNA is generally detectable in upper respiratoy specimens during the acute phase of infection. The lowest concentration of SARS-CoV-2 viral copies this assay can detect is 131 copies/mL. A negative result does not preclude SARS-Cov-2 infection and should not be used as the sole basis for treatment or other patient management decisions. A negative result may occur with  improper specimen collection/handling, submission of specimen other than nasopharyngeal swab, presence of viral mutation(s) within the areas targeted by this assay, and inadequate number of viral copies (<131 copies/mL). A negative result must be combined with clinical observations, patient history, and epidemiological information. The expected result is Negative.  Fact Sheet for Patients:  https://www.moore.com/  Fact Sheet for Healthcare Providers:   https://www.young.biz/  This test is no t yet approved or cleared by the [place] FDA and  has been authorized for detection and/or diagnosis of SARS-CoV-2 by FDA under an Emergency Use Authorization (EUA). This EUA will remain  in effect (meaning this test can be used) for the duration of the COVID-19 declaration under Section 564(b)(1) of the Act, 21 U.S.C. section 360bbb-3(b)(1), unless the authorization is terminated or revoked sooner.     Influenza A by PCR NEGATIVE NEGATIVE Final   Influenza B by PCR NEGATIVE NEGATIVE Final    Comment: (NOTE) The Xpert Xpress SARS-CoV-2/FLU/RSV assay is intended as an aid in  the diagnosis of influenza from Nasopharyngeal swab specimens and  should not be used as a sole basis for treatment. Nasal washings and  aspirates are unacceptable for Xpert Xpress SARS-CoV-2/FLU/RSV  testing.  Fact Sheet for Patients: https://www.moore.com/  Fact Sheet for Healthcare Providers: https://www.young.biz/  This test is not yet approved or cleared by the [place] FDA and  has been authorized for detection and/or diagnosis of SARS-CoV-2 by  FDA under an Emergency Use Authorization (EUA). This EUA will remain  in effect (meaning this test can be used) for the duration of the  Covid-19 declaration under Section 564(b)(1) of the Act, 21  U.S.C. section 360bbb-3(b)(1), unless the authorization is  terminated or revoked. Performed at [facility], [address] [place], [place] [address]   Blood culture (routine single)     Status: None (Preliminary result)   Collection Time: 12/19/19  7:15 PM   Specimen: BLOOD  Result Value Ref Range Status   Specimen Description BLOOD LEFT ANTECUBITAL  Final   Special Requests   Final    BOTTLES DRAWN AEROBIC AND ANAEROBIC Blood Culture adequate volume   Culture   Final    NO GROWTH 3 DAYS Performed at [facility], [address], [place], [place] [address]    Report Status PENDING  Incomplete  Body fluid culture     Status: None (Preliminary result)   Collection Time: 12/20/19 11:54 AM   Specimen: PATH Cytology Pleural fluid  Result Value Ref Range Status   Specimen Description   Final    PLEURAL Performed at [facility], [address] [place], [place] [address]    Special Requests   Final    NONE Performed at [facility], [address] [place], [place] [address]    Gram Stain   Final    RARE WBC PRESENT,BOTH PMN AND MONONUCLEAR NO ORGANISMS SEEN    Culture   Final    NO GROWTH 2 DAYS Performed at [facility], [address], [place], [place] [address]    Report Status PENDING  Incomplete  Gastrointestinal Panel by PCR , Stool     Status: None   Collection Time: 12/21/19 11:29 AM   Specimen: Rectum; Stool  Result Value Ref Range Status   Campylobacter species NOT DETECTED NOT DETECTED Final   Plesimonas shigelloides NOT DETECTED NOT DETECTED Final   Salmonella species NOT DETECTED NOT DETECTED Final   Yersinia enterocolitica NOT DETECTED NOT DETECTED Final   Vibrio species NOT DETECTED NOT DETECTED Final   Vibrio cholerae NOT DETECTED NOT DETECTED Final   Enteroaggregative E coli (EAEC) NOT DETECTED NOT DETECTED Final   Enteropathogenic E coli (EPEC) NOT DETECTED NOT DETECTED Final   Enterotoxigenic E coli (ETEC) NOT DETECTED NOT DETECTED Final   Shiga like toxin producing E coli (STEC) NOT DETECTED NOT DETECTED Final   Shigella/Enteroinvasive E coli (EIEC) NOT DETECTED NOT DETECTED Final   Cryptosporidium NOT DETECTED NOT DETECTED Final   Cyclospora cayetanensis NOT DETECTED NOT DETECTED Final   Entamoeba histolytica NOT DETECTED NOT DETECTED Final  Giardia lamblia NOT DETECTED NOT DETECTED Final   Adenovirus F40/41 NOT DETECTED NOT DETECTED Final   Astrovirus NOT DETECTED NOT DETECTED Final   Norovirus GI/GII NOT DETECTED NOT DETECTED Final   Rotavirus A  NOT DETECTED NOT DETECTED Final   Sapovirus (I, II, IV, and V) NOT DETECTED NOT DETECTED Final    Comment: Performed at [facility], [address] [place], [place] [address]     Time coordinating discharge: Over 30 minutes  SIGNED:   Lynn Ito, MD  [facility] 12/22/2019, 1:46 PM Pager   If 7PM-7AM, please contact night-coverage [email or web address] Password [email or web address]

## 2019-12-22 NOTE — Plan of Care (Signed)
  Problem: Clinical Measurements: Goal: Ability to maintain clinical measurements within normal limits will improve Outcome: Adequate for Discharge Goal: Will remain free from infection Outcome: Adequate for Discharge Goal: Diagnostic test results will improve Outcome: Adequate for Discharge Goal: Respiratory complications will improve Outcome: Adequate for Discharge Goal: Cardiovascular complication will be avoided Outcome: Adequate for Discharge   Problem: Activity: Goal: Risk for activity intolerance will decrease Outcome: Adequate for Discharge   

## 2019-12-23 LAB — BODY FLUID CULTURE: Culture: NO GROWTH

## 2019-12-23 LAB — QUANTIFERON-TB GOLD PLUS: QuantiFERON-TB Gold Plus: UNDETERMINED — AB

## 2019-12-23 LAB — QUANTIFERON-TB GOLD PLUS (RQFGPL)
QuantiFERON Mitogen Value: 0.03 IU/mL
QuantiFERON Nil Value: 0 IU/mL
QuantiFERON TB1 Ag Value: 0 IU/mL
QuantiFERON TB2 Ag Value: 0 IU/mL

## 2019-12-23 LAB — MPO/PR-3 (ANCA) ANTIBODIES
ANCA Proteinase 3: 8.8 U/mL — ABNORMAL HIGH (ref 0.0–3.5)
Myeloperoxidase Abs: <9 U/mL (ref 0.0–9.0)

## 2019-12-23 LAB — ANA W/REFLEX: Anti Nuclear Antibody (ANA): NEGATIVE

## 2019-12-24 ENCOUNTER — Telehealth: Payer: Self-pay

## 2019-12-24 LAB — CULTURE, BLOOD (SINGLE)
Culture: NO GROWTH
Special Requests: ADEQUATE

## 2019-12-24 NOTE — Telephone Encounter (Signed)
°  Patient was recently discharged from the hospital on 12/22/19.  No TCM completed, patient does not qualify for TCM services due to [employer] coverage.  Per discharge summary patient needs follow up with PCP. No HFU scheduled at this time. Next apt is a CPE on 01/21/20 @ 2:00 PM.

## 2019-12-25 LAB — STRONGYLOIDES, AB, IGG: Strongyloides, Ab, IgG: NEGATIVE

## 2019-12-25 LAB — CYTOLOGY - NON PAP

## 2019-12-26 LAB — FUNGITELL, SERUM: Fungitell Result: <31 pg/mL (ref <80)

## 2019-12-27 ENCOUNTER — Ambulatory Visit: Payer: BC Managed Care – PPO | Admitting: Physician Assistant

## 2019-12-27 ENCOUNTER — Other Ambulatory Visit: Payer: Self-pay

## 2019-12-27 ENCOUNTER — Encounter: Payer: Self-pay | Admitting: Physician Assistant

## 2019-12-27 VITALS — BP 129/83 | HR 99 | Temp 97.6°F | Wt 274.6 lb

## 2019-12-27 DIAGNOSIS — I2693 Single subsegmental pulmonary embolism without acute cor pulmonale: Secondary | ICD-10-CM

## 2019-12-27 DIAGNOSIS — R079 Chest pain, unspecified: Secondary | ICD-10-CM

## 2019-12-27 DIAGNOSIS — J189 Pneumonia, unspecified organism: Secondary | ICD-10-CM

## 2019-12-27 DIAGNOSIS — I10 Essential (primary) hypertension: Secondary | ICD-10-CM | POA: Diagnosis not present

## 2019-12-27 NOTE — Progress Notes (Signed)
Established patient visit   Patient: Alexandra Fleming   DOB: May 19, 1990   29 y.o. Female  MRN: [id number] Visit Date: 12/27/2019  Today's healthcare provider: Trey Sailors, PA-C   Chief Complaint  Patient presents with  . Hospitalization Follow-up  I,Porsha C McClurkin,acting as a scribe for Trey Sailors, PA-C.,have documented all relevant documentation on the behalf of Trey Sailors, PA-C,as directed by  Trey Sailors, PA-C while in the presence of Trey Sailors, PA-C.  Subjective    HPI  Follow up Hospitalization  Patient was admitted to [facility] on 12/19/2019 and discharged on 12/22/2019. She was treated for right-sided pleural effusion and bilateral pneumonia.  Treatment for this included: labs, EKG, chest x-ray, thoracentesis and IV steroids Telephone follow up was done on 12/24/2019 w/ McKenzie She reports good compliance with treatment. She reports this condition is improved some. Patient reports some SOB still and intermittent chest pain.   Patient on 12/19/2019 had recurrence of chest pain and SOB. She was seen by cancer center and had CTA which revealed right sided pleural effusion and progressed bilateral pneumonia. Patient admitted to [facility] and underwent thoracentesis which removed 400 mL of fluid. Fluid analysis showed eosinophilic pleural effusion. She was started on IV steroids and continued on oral steroids upon discharge. Her quantiferon gold was indeterminate and ANCA proteinase 3 was elevated. She experienced improvement physically and radiographically with steroids. She was seen by Dr. Mayo Ao on 12/27/2019 and repeat CXR showed resolution. She was instructed to continue steroids for four more weeks and then RTC to discuss further dosing. She is getting a sleep study arranged. She will be kept on anticoagulation indefinitely for now. She was instructed she should remove her mirena due to PE.   Today she feels somewhat better. She does continue  to have intermittent stabbing chest pains which are extremely painful and cause her to lose her breath. She did mention this to the hospitalists but no explanation found.   ----------------------------------------------------------------------------------------- -       Medications: Outpatient Medications Prior to Visit  Medication Sig  . apixaban (ELIQUIS) 5 MG TABS tablet Take 2 tablets (10mg ) twice daily for 7 days, then 1 tablet (5mg ) twice daily  . escitalopram (LEXAPRO) 20 MG tablet TAKE 1 TABLET(20 MG) BY MOUTH DAILY  . famotidine (PEPCID) 20 MG tablet Take 1 tablet (20 mg total) by mouth daily.  losartan (COZAAR) 50 MG tablet Take 1 tablet (50 mg total) by mouth at bedtime.  losartan-hydrochlorothiazide (HYZAAR) 50-12.5 MG tablet TAKE 1 TABLET BY MOUTH DAILY  . predniSONE (DELTASONE) 10 MG tablet Take 3 tablets (30 mg total) by mouth daily with breakfast for 7 days.  . chlorpheniramine-HYDROcodone (TUSSIONEX) 10-8 MG/5ML SUER Take 5 mLs by mouth every 12 (twelve) hours as needed for cough. (Patient not taking: Reported on 12/19/2019)  . hydrOXYzine (ATARAX/VISTARIL) 10 MG tablet Take 10 mg by mouth 3 (three) times daily as needed. (Patient not taking: Reported on 12/19/2019)   No facility-administered medications prior to visit.    Review of Systems    Objective    BP 129/83 (BP Location: Left Arm, Patient Position: Sitting, Cuff Size: Large)   Pulse 99   Temp 97.6 F (36.4 C) (Oral)   Wt 274 lb 9.6 oz (124.6 kg)   SpO2 99%   BMI 44.32 kg/m    Physical Exam Constitutional:      Appearance: Normal appearance.  Cardiovascular:     Rate and Rhythm:  Normal rate and regular rhythm.     Heart sounds: Normal heart sounds.  Pulmonary:     Effort: Pulmonary effort is normal.     Breath sounds: Normal breath sounds.  Skin:    General: Skin is warm and dry.  Neurological:     Mental Status: She is alert and oriented to person, place, and time. Mental status is at  baseline.  Psychiatric:        Mood and Affect: Mood normal.        Behavior: Behavior normal.       No results found for any visits on 12/27/19.  Assessment & Plan    1. Community acquired pneumonia, bilateral  Eosinophilic pneumonia, improving with steroids. Followed by [facility] pulmonology.  2. Primary hypertension  - Comprehensive Metabolic Panel (CMET) - CBC with Differential  3. Chest pain, unspecified type  Small course of narcotics sent in as she is on anticoagulation and cannot have NSAIDs.   4. Single subsegmental pulmonary embolism without acute cor pulmonale (HCC)  Continue eliquis.    No follow-ups on file.      ITrey Sailors, PA-C, have reviewed all documentation for this visit. The documentation on 01/02/20 for the exam, diagnosis, procedures, and orders are all accurate and complete.  The entirety of the information documented in the History of Present Illness, Review of Systems and Physical Exam were personally obtained by me. Portions of this information were initially documented by [facility] and reviewed by me for thoroughness and accuracy.     Maryella Shivers  [facility] [phone] (phone) [phone] (fax)  [facility]

## 2019-12-27 NOTE — Patient Instructions (Signed)
Community-Acquired Pneumonia, Adult Pneumonia is an infection of the lungs. It causes swelling in the airways of the lungs. Mucus and fluid may also build up inside the airways. One type of pneumonia can happen while a person is in a hospital. A different type can happen when a person is not in a hospital (community-acquired pneumonia).  What are the causes?  This condition is caused by germs (viruses, bacteria, or fungi). Some types of germs can be passed from one person to another. This can happen when you breathe in droplets from the cough or sneeze of an infected person. What increases the risk? You are more likely to develop this condition if you:  Have a long-term (chronic) disease, such as: ? Chronic obstructive pulmonary disease (COPD). ? Asthma. ? Cystic fibrosis. ? Congestive heart failure. ? Diabetes. ? Kidney disease.  Have HIV.  Have sickle cell disease.  Have had your spleen removed.  Do not take good care of your teeth and mouth (poor dental hygiene).  Have a medical condition that increases the risk of breathing in droplets from your own mouth and nose.  Have a weakened body defense system (immune system).  Are a smoker.  Travel to areas where the germs that cause this illness are common.  Are around certain animals or the places they live. What are the signs or symptoms?  A dry cough.  A wet (productive) cough.  Fever.  Sweating.  Chest pain. This often happens when breathing deeply or coughing.  Fast breathing or trouble breathing.  Shortness of breath.  Shaking chills.  Feeling tired (fatigue).  Muscle aches. How is this treated? Treatment for this condition depends on many things. Most adults can be treated at home. In some cases, treatment must happen in a hospital. Treatment may include:  Medicines given by mouth or through an IV tube.  Being given extra oxygen.  Respiratory therapy. In rare cases, treatment for very bad pneumonia  may include:  Using a machine to help you breathe.  Having a procedure to remove fluid from around your lungs. Follow these instructions at home: Medicines  Take over-the-counter and prescription medicines only as told by your doctor. ? Only take cough medicine if you are losing sleep.  If you were prescribed an antibiotic medicine, take it as told by your doctor. Do not stop taking the antibiotic even if you start to feel better. General instructions   Sleep with your head and neck raised (elevated). You can do this by sleeping in a recliner or by putting a few pillows under your head.  Rest as needed. Get at least 8 hours of sleep each night.  Drink enough water to keep your pee (urine) pale yellow.  Eat a healthy diet that includes plenty of vegetables, fruits, whole grains, low-fat dairy products, and lean protein.  Do not use any products that contain nicotine or tobacco. These include cigarettes, e-cigarettes, and chewing tobacco. If you need help quitting, ask your doctor.  Keep all follow-up visits as told by your doctor. This is important. How is this prevented? A shot (vaccine) can help prevent pneumonia. Shots are often suggested for:  People older than 31 years of age.  People older than 31 years of age who: ? Are having cancer treatment. ? Have long-term (chronic) lung disease. ? Have problems with their body's defense system. You may also prevent pneumonia if you take these actions:  Get the flu (influenza) shot every year.  Go to the dentist as   often as told.  Wash your hands often. If you cannot use soap and water, use hand sanitizer. Contact a doctor if:  You have a fever.  You lose sleep because your cough medicine does not help. Get help right away if:  You are short of breath and it gets worse.  You have more chest pain.  Your sickness gets worse. This is very serious if: ? You are an older adult. ? Your body's defense system is weak.  You  cough up blood. Summary  Pneumonia is an infection of the lungs.  Most adults can be treated at home. Some will need treatment in a hospital.  Drink enough water to keep your pee pale yellow.  Get at least 8 hours of sleep each night. This information is not intended to replace advice given to you by your health care provider. Make sure you discuss any questions you have with your health care provider. Document Revised: 05/31/2018 Document Reviewed: 10/06/2017 Elsevier Patient Education  2020 [employer]  

## 2019-12-28 ENCOUNTER — Telehealth: Payer: Self-pay | Admitting: Physician Assistant

## 2019-12-28 DIAGNOSIS — J189 Pneumonia, unspecified organism: Secondary | ICD-10-CM

## 2019-12-28 DIAGNOSIS — I2693 Single subsegmental pulmonary embolism without acute cor pulmonale: Secondary | ICD-10-CM

## 2019-12-28 MED ORDER — HYDROCODONE-ACETAMINOPHEN 5-325 MG PO TABS: 1.0000 | ORAL_TABLET | Freq: Two times a day (BID) | ORAL | 0 refills | Status: AC

## 2019-12-28 NOTE — Telephone Encounter (Signed)
Patient called to ask the doctor about the medication that she stated she would send to patient's local pharmacy for pain.  Please advise and call patient to give her an update at [phone]

## 2019-12-28 NOTE — Telephone Encounter (Signed)
Please advise 

## 2019-12-28 NOTE — Telephone Encounter (Signed)
Medication sent in. 

## 2019-12-31 NOTE — Telephone Encounter (Signed)
Has she tried some melatonin? She can try that for sleeping. Has she called her pulmonologist? He has the most recent CXR on file, I unfortunately cannot see it to compare. If she calls he might order her a CXR to compare any progression or have her come into office. If she has any worsening SOB, extremely high fever etc should be directed to ED. If she is unable to get in touch with pulmonology she can come in here and we can re-evaluate.

## 2019-12-31 NOTE — Telephone Encounter (Signed)
Returned patients call and she report she picked up medication on Saturday,12/29/2019. Patient want you to know that she had a productive cough with bloody mucus. She reports not sleeping good due to death in her family. Please advise.

## 2020-01-01 NOTE — Telephone Encounter (Signed)
Left message to call back. Ok for [facility] to as below.

## 2020-01-03 NOTE — Telephone Encounter (Signed)
Patient was advised and reports she will reach out to her pulmonologist as well. She reports feeling a little better now and has not had any fevers.

## 2020-01-11 ENCOUNTER — Inpatient Hospital Stay: Payer: BC Managed Care – PPO | Attending: Internal Medicine

## 2020-01-11 ENCOUNTER — Inpatient Hospital Stay (HOSPITAL_BASED_OUTPATIENT_CLINIC_OR_DEPARTMENT_OTHER): Payer: BC Managed Care – PPO | Admitting: Internal Medicine

## 2020-01-11 ENCOUNTER — Encounter: Payer: Self-pay | Admitting: Internal Medicine

## 2020-01-11 DIAGNOSIS — D638 Anemia in other chronic diseases classified elsewhere: Secondary | ICD-10-CM | POA: Diagnosis not present

## 2020-01-11 DIAGNOSIS — Z8616 Personal history of COVID-19: Secondary | ICD-10-CM | POA: Insufficient documentation

## 2020-01-11 DIAGNOSIS — I2699 Other pulmonary embolism without acute cor pulmonale: Secondary | ICD-10-CM

## 2020-01-11 DIAGNOSIS — Z7901 Long term (current) use of anticoagulants: Secondary | ICD-10-CM | POA: Diagnosis not present

## 2020-01-11 LAB — COMPREHENSIVE METABOLIC PANEL
ALT: 19 U/L (ref 0–44)
AST: 19 U/L (ref 15–41)
Albumin: 3.9 g/dL (ref 3.5–5.0)
Alkaline Phosphatase: 67 U/L (ref 38–126)
Anion gap: 9 (ref 5–15)
BUN: 24 mg/dL — ABNORMAL HIGH (ref 6–20)
CO2: 27 mmol/L (ref 22–32)
Calcium: 9.2 mg/dL (ref 8.9–10.3)
Chloride: 101 mmol/L (ref 98–111)
Creatinine, Ser: 0.86 mg/dL (ref 0.44–1.00)
GFR, Estimated: >60 mL/min (ref >60)
Glucose, Bld: 109 mg/dL — ABNORMAL HIGH (ref 70–99)
Potassium: 4.3 mmol/L (ref 3.5–5.1)
Sodium: 137 mmol/L (ref 135–145)
Total Bilirubin: 0.5 mg/dL (ref 0.3–1.2)
Total Protein: 7.2 g/dL (ref 6.5–8.1)

## 2020-01-11 LAB — CBC WITH DIFFERENTIAL/PLATELET
Abs Immature Granulocytes: 0.11 10*3/uL — ABNORMAL HIGH (ref 0.00–0.07)
Basophils Absolute: 0 10*3/uL (ref 0.0–0.1)
Basophils Relative: 0 %
Eosinophils Absolute: 0.1 10*3/uL (ref 0.0–0.5)
Eosinophils Relative: 1 %
HCT: 30.2 % — ABNORMAL LOW (ref 36.0–46.0)
Hemoglobin: 9.2 g/dL — ABNORMAL LOW (ref 12.0–15.0)
Immature Granulocytes: 1 %
Lymphocytes Relative: 10 %
Lymphs Abs: 1.1 10*3/uL (ref 0.7–4.0)
MCH: 23 pg — ABNORMAL LOW (ref 26.0–34.0)
MCHC: 30.5 g/dL (ref 30.0–36.0)
MCV: 75.5 fL — ABNORMAL LOW (ref 80.0–100.0)
Monocytes Absolute: 0.4 10*3/uL (ref 0.1–1.0)
Monocytes Relative: 4 %
Neutro Abs: 9.2 10*3/uL — ABNORMAL HIGH (ref 1.7–7.7)
Neutrophils Relative %: 84 %
Platelets: 230 10*3/uL (ref 150–400)
RBC: 4 MIL/uL (ref 3.87–5.11)
RDW: 17.1 % — ABNORMAL HIGH (ref 11.5–15.5)
WBC: 10.9 10*3/uL — ABNORMAL HIGH (ref 4.0–10.5)
nRBC: 0 % (ref 0.0–0.2)

## 2020-01-11 LAB — C-REACTIVE PROTEIN: CRP: 1.6 mg/dL — ABNORMAL HIGH (ref <1.0)

## 2020-01-11 NOTE — Progress Notes (Signed)
[facility] CONSULT NOTE  Patient Care Team: Maryella ShiversPollak, Adriana M, PA-C as PCP - General (Physician Assistant)  CHIEF COMPLAINTS/PURPOSE OF CONSULTATION: DVT/PE  # SEP 25th, 2021- Acute pulmonary embolism in posterior intralobar branches of right lower lobe pulmonary artery; Right lower lobe airspace disease-pneumonia or pulmonary infarct. Mild infiltrate or atelectasis in posterior left lower lobe; PRIOT O HEPARIN [drawn in ER ] APTT- 45; lupus anticoagulant/DRVVT- POSITIVE; Anticardiolipin IgG-HIGHE; IgM- indeterminate; Beta2 GP- IgG- HIGH; PTT-LA- POSITIVE; HEXAGONALPHASE- POSITIVE  # ANEMIA- Hb 7.8 [sep 2021]; iron saturation 2% ferritin-77 s/p PRBC transfusion; Venofer;[in Hospital] ? Anemia of Chronic disease- monitor  # COVID-19 [Jan 2021; NO mabs therapy] s/p Vaccine; SEP 2021- RLL Pneumonia; OCT 2021- CT worsening infiltrates [post COVID]- inflammation- on prednisone [Dr.FLeming]  Oncology History   No history exists.     HISTORY OF PRESENTING ILLNESS:  Alexandra Fleming 29 y.o.  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  In the interim patient was evaluated in the management clinic of worsening chest pain.  CT scan showed worsening bilateral chest infiltrates for which she was admitted to hospital.  She was evaluated by pulmonary.  She is currently on tapering dose of prednisone as per pulmonary.  Continues to complain of intermittent/posterior chest wall pain.  Otherwise no nausea no vomiting no headache.  No blood in stools black or stools.  Patient seems to be compliant with her Eliquis.  Review of Systems  Constitutional: Positive for malaise/fatigue. Negative for chills, diaphoresis, fever and weight loss.  HENT: Negative for nosebleeds and sore throat.   Eyes: Negative for double vision.  Respiratory: Positive for shortness of breath.  Negative for cough, hemoptysis, sputum production and wheezing.   Cardiovascular: Negative for chest pain, palpitations, orthopnea and leg swelling.  Gastrointestinal: Positive for nausea. Negative for abdominal pain, blood in stool, constipation, diarrhea, heartburn, melena and vomiting.  Genitourinary: Negative for dysuria, frequency and urgency.  Musculoskeletal: Negative for back pain and joint pain.  Skin: Negative.  Negative for itching and rash.  Neurological: Negative for dizziness, tingling, focal weakness, weakness and headaches.  Endo/Heme/Allergies: Does not bruise/bleed easily.  Psychiatric/Behavioral: Negative for depression. The patient is not nervous/anxious and does not have insomnia.      MEDICAL HISTORY:  Past Medical History:  Diagnosis Date  . COVID-19 virus infection   . Depression   . HTN (hypertension)     SURGICAL HISTORY: Past Surgical History:  Procedure Laterality Date  . WISDOM TOOTH EXTRACTION      SOCIAL HISTORY: Social History   Socioeconomic History  . Marital status: Married    Spouse name: Not on file  . Number of children: Not on file  . Years of education: Not on file  . Highest education level: Not on file  Occupational History  . Not on file  Tobacco Use  . Smoking status: Never Smoker  . Smokeless tobacco: Never Used  Substance and Sexual Activity  . Alcohol use: Yes    Alcohol/week: 6.0 standard drinks    Types: 2 Glasses of wine, 4 Cans of beer per week  . Drug use: No  . Sexual activity: Not on file  Other Topics Concern  . Not on file  Social History Narrative   No smoking; ocassional alcohol. Used to be Web designerelementary music teacher; MicrobiologistUNC- administrative assistant. Live sin graham/husband.    Social Determinants of Health  Financial Resource Strain: Medium Risk  . Difficulty of Paying Living Expenses: Somewhat hard  Food Insecurity: No Food Insecurity  . Worried About Programme researcher, broadcasting/film/video in the Last Year: Never true  .  Ran Out of Food in the Last Year: Never true  Transportation Needs: No Transportation Needs  . Lack of Transportation (Medical): No  . Lack of Transportation (Non-Medical): No  Physical Activity: Inactive  . Days of Exercise per Week: 0 days  . Minutes of Exercise per Session: 0 min  Stress:   . Feeling of Stress : Not on file  Social Connections: Moderately Isolated  . Frequency of Communication with Friends and Family: More than three times a week  . Frequency of Social Gatherings with Friends and Family: More than three times a week  . Attends Religious Services: Never  . Active Member of Clubs or Organizations: No  . Attends Banker Meetings: Never  . Marital Status: Married  Catering manager Violence:   . Fear of Current or Ex-Partner: Not on file  . Emotionally Abused: Not on file  . Physically Abused: Not on file  . Sexually Abused: Not on file    FAMILY HISTORY: Family History  Problem Relation Age of Onset  . Allergies Mother   . Breast cancer Mother 40       DCIS  . High Cholesterol Father   . Hypertension Father   . Sleep apnea Father   . Diabetes Maternal Grandmother   . Hypertension Maternal Grandmother   . COPD Maternal Grandfather   . Thyroid disease Paternal Grandmother   . Hypertension Paternal Grandmother   . Hyperlipidemia Paternal Grandmother   . Sleep apnea Paternal Grandfather   . Hypertension Paternal Grandfather   . Hyperlipidemia Paternal Grandfather   . Hypertension Paternal Aunt   . Anxiety disorder Paternal Aunt   . Hypertension Maternal Uncle     ALLERGIES:  is allergic to kiwi extract, amoxicillin, and penicillins.  MEDICATIONS:  Current Outpatient Medications  Medication Sig Dispense Refill  . apixaban (ELIQUIS) 5 MG TABS tablet Take 2 tablets (10mg ) twice daily for 7 days, then 1 tablet (5mg ) twice daily 60 tablet 1  . chlorpheniramine-HYDROcodone (TUSSIONEX) 10-8 MG/5ML SUER Take 5 mLs by mouth every 12 (twelve) hours  as needed for cough. 140 mL 0  . escitalopram (LEXAPRO) 20 MG tablet TAKE 1 TABLET(20 MG) BY MOUTH DAILY 90 tablet 0  . famotidine (PEPCID) 20 MG tablet Take 1 tablet (20 mg total) by mouth daily. 30 tablet 0  . hydrOXYzine (ATARAX/VISTARIL) 10 MG tablet Take 10 mg by mouth 3 (three) times daily as needed.     losartan (COZAAR) 50 MG tablet Take 1 tablet (50 mg total) by mouth at bedtime. 30 tablet 0  . losartan-hydrochlorothiazide (HYZAAR) 50-12.5 MG tablet TAKE 1 TABLET BY MOUTH DAILY 90 tablet 0   No current facility-administered medications for this visit.       PHYSICAL EXAMINATION:  Vitals:   01/11/20 1426  BP: 123/85  Pulse: 86  Temp: 98.3 F (36.8 C)  SpO2: 100%   Filed Weights   01/11/20 1426  Weight: 271 lb 9.6 oz (123.2 kg)    Physical Exam Constitutional:      Comments: Patient walk independently. Accompanied by her husband. No acute distress.  HENT:     Head: Normocephalic and atraumatic.     Mouth/Throat:     Pharynx: No oropharyngeal exudate.  Eyes:     Pupils: Pupils are  equal, round, and reactive to light.  Cardiovascular:     Rate and Rhythm: Normal rate and regular rhythm.  Pulmonary:     Effort: Pulmonary effort is normal. No respiratory distress.     Breath sounds: Normal breath sounds. No wheezing.  Abdominal:     General: Bowel sounds are normal. There is no distension.     Palpations: Abdomen is soft. There is no mass.     Tenderness: There is no abdominal tenderness. There is no guarding or rebound.  Musculoskeletal:        General: No tenderness. Normal range of motion.     Cervical back: Normal range of motion and neck supple.  Skin:    General: Skin is warm.  Neurological:     Mental Status: She is alert and oriented to person, place, and time.  Psychiatric:        Mood and Affect: Affect normal.      LABORATORY DATA:  I have reviewed the data as listed Lab Results  Component Value Date   WBC 10.9 (H) 01/11/2020   HGB 9.2  (L) 01/11/2020   HCT 30.2 (L) 01/11/2020   MCV 75.5 (L) 01/11/2020   PLT 230 01/11/2020   Recent Labs    11/19/19 0211 11/19/19 0211 11/20/19 0457 11/20/19 0457 11/21/19 0343 11/30/19 1155 12/19/19 0819 12/19/19 1330 12/20/19 0412 12/20/19 0412 12/21/19 0556 12/22/19 0740 01/11/20 1337  NA 138   < > 135   < > 138   < > 136   < > 137   < > 137 138 137  K 3.6   < > 3.7   < > 3.7   < > 3.9   < > 4.3   < > 4.0 4.3 4.3  CL 105   < > 103   < > 101   < > 103   < > 104   < > 101 103 101  CO2 23   < > 24   < > 25   < > 23   < > 24   < > 21* 24 27  GLUCOSE 123*   < > 114*   < > 114*   < > 117*   < > 152*   < > 145* 120* 109*  BUN 14   < > 8   < > 8   < > 10   < > 11   < > 17 24* 24*  CREATININE 0.91   < > 0.76   < > 0.67   < > 0.82   < > 0.75   < > 0.74 0.76 0.86  CALCIUM 7.7*   < > 7.9*   < > 8.3*   < > 8.5*   < > 9.5   < > 9.1 9.3 9.2  GFRNONAA >60   < > >60   < > >60   < > >60   < > >60   < > >60 >60 >60  GFRAA >60  --  >60  --  >60  --   --   --   --   --   --   --   --   PROT  --   --   --   --   --    < > 7.4  --  7.8  --   --   --  7.2  ALBUMIN  --   --   --   --   --    < >  3.6  --  3.9  --   --   --  3.9  AST  --   --   --   --   --    < > 18  --  21  --   --   --  19  ALT  --   --   --   --   --    < > 14  --  15  --   --   --  19  ALKPHOS  --   --   --   --   --    < > 59  --  67  --   --   --  67  BILITOT  --   --   --   --   --    < > 0.7  --  0.6  --   --   --  0.5   < > = values in this interval not displayed.    RADIOGRAPHIC STUDIES: I have personally reviewed the radiological images as listed and agreed with the findings in the report. CT ANGIO CHEST PE W OR WO CONTRAST  Result Date: 12/19/2019 CLINICAL DATA:  Chest pain.  Shortness of breath. EXAM: CT ANGIOGRAPHY CHEST WITH CONTRAST TECHNIQUE: Multidetector CT imaging of the chest was performed using the standard protocol during bolus administration of intravenous contrast. Multiplanar CT image reconstructions and  MIPs were obtained to evaluate the vascular anatomy. CONTRAST:  57mL OMNIPAQUE IOHEXOL 350 MG/ML SOLN COMPARISON:  11/17/2019 FINDINGS: Cardiovascular: The heart size is normal. No substantial pericardial effusion. No thoracic aortic aneurysm. No large central pulmonary embolus. Trace pulmonary embolus identified in subsegmental pulmonary arteries to the left lower lobe (image 129/series 5 and image 190/series 5). No evidence for acute pulmonary embolus in the right lung. Mediastinum/Nodes: No mediastinal lymphadenopathy. There is no hilar lymphadenopathy. The esophagus has normal imaging features. There is no axillary lymphadenopathy. Lungs/Pleura: There is diffuse patchy and nodular airspace disease involving all lobes of both lungs. Areas of collapse/consolidation are noted in the right lower lobe and there is a small to moderate right pleural effusion. Upper Abdomen: Unremarkable. Musculoskeletal: No worrisome lytic or sclerotic osseous abnormality. Review of the MIP images confirms the above findings. IMPRESSION: 1. Trace pulmonary embolus identified in subsegmental pulmonary arteries to the left lower lobe. No evidence for acute pulmonary embolus in the right lung. 2. Diffuse patchy and nodular airspace disease involving all lobes of both lungs. This is substantially progressed since 11/17/2019 as the patient had no upper lobe or right middle lobe involvement on the prior exam. Areas of collapse/consolidation are noted in the right lower lobe and there is a small to moderate right pleural effusion. Imaging features most likely secondary to infectious/inflammatory etiology. Atypical or viral pneumonia could have this appearance. Critical Value/emergent results were called by me at the time of interpretation on 12/19/2019 at 12:20 pm to provider JENNIFER BURNS , who verbally acknowledged these results. Electronically Signed   By: Kennith Center M.D.   On: 12/19/2019 12:33   DG Chest Right Decubitus  Result  Date: 12/21/2019 CLINICAL DATA:  Pleural effusion. EXAM: CHEST - RIGHT DECUBITUS COMPARISON:  PA chest x-ray 12/20/2019. FINDINGS: Right-side-down decubitus reveals minimal pleural effusion. No prominent pleural effusion identified. No pneumothorax. No evidence of loculated fluid collection. IMPRESSION: Minimal right pleural effusion. Electronically Signed   By: Maisie Fus  Register   On: 12/21/2019 12:12   DG Chest Port 1 View  Result Date:  12/22/2019 CLINICAL DATA:  Follow-up for pleural effusion. History of COVID-19 infection. EXAM: PORTABLE CHEST 1 VIEW COMPARISON:  12/21/2019, 12/20/2019 and earlier studies. FINDINGS: Small right pleural effusion. Lungs appear clear. The lung opacities noted on the CT of the chest from 12/19/2019 are not appreciated on the portable chest radiograph. No pneumothorax. Heart, mediastinum and hila are unremarkable. IMPRESSION: 1. Lungs appear clear radiographically. The ground-glass lung opacities noted on the recent chest CT are likely not visible on the portable radiograph as opposed to being resolved. 2. Small right pleural effusion, not well-defined on the AP portable chest radiograph. Electronically Signed   By: Amie Portland M.D.   On: 12/22/2019 08:05   DG Chest Port 1 View  Result Date: 12/20/2019 CLINICAL DATA:  Status post right thoracentesis. EXAM: PORTABLE CHEST 1 VIEW COMPARISON:  CTA of the chest on 12/19/2019 and chest x-ray on 11/18/2019. FINDINGS: The heart size and mediastinal contours are within normal limits. No pneumothorax after thoracentesis. No significant pleural fluid identified. Mild right basilar atelectasis. Patchy bilateral interstitial infiltrates. The visualized skeletal structures are unremarkable. IMPRESSION: No pneumothorax after right-sided thoracentesis. Patchy bilateral interstitial infiltrates. Electronically Signed   By: Irish Lack M.D.   On: 12/20/2019 12:09   US THORACENTESIS ASP PLEURAL SPACE W/IMG GUIDE  Result Date:  12/20/2019 CLINICAL DATA:  Status post right thoracentesis. EXAM: ULTRASOUND GUIDED RIGHT THORACENTESIS COMPARISON:  None. PROCEDURE: An ultrasound guided thoracentesis was thoroughly discussed with the patient and questions answered. The benefits, risks, alternatives and complications were also discussed. The patient understands and wishes to proceed with the procedure. Written consent was obtained. Ultrasound was performed to localize and mark an adequate pocket of fluid in the right chest. The area was then prepped and draped in the normal sterile fashion. 1% Lidocaine was used for local anesthesia. Under ultrasound guidance a 6 French Safe-T-Centesis catheter was introduced. Thoracentesis was performed. The catheter was removed and a dressing applied. COMPLICATIONS: None FINDINGS: A total of approximately 400 mL of amber fluid was removed. A fluid sample was sent for laboratory analysis. IMPRESSION: Successful ultrasound guided right thoracentesis yielding 400 mL of pleural fluid. Electronically Signed   By: Irish Lack M.D.   On: 12/20/2019 12:05    ASSESSMENT & PLAN:   Pulmonary embolism on right [facility]) #Pulmonary embolism right lobar arteries- [sep 25th, 2021]-etiology likely post Covid/lupus antibodies see discussion below.  Recommend 6 months of anticoagulation [given recent worsening pulmonary infiltrates//compromised lung function]; patient tolerating Eliquis well.  Will call for any refills if needed.  Okay to continue Mirena-progesterone only IUD.  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  We will repeat blood work after finishing anticoagulation.  #OCT 2021-RIght pleural effusion s/p Thora-Bilateral lung infiltrates-question related to post Covid; recommend follow-up with pulmonary.  CRP improving.  Likely causing of her chest discomfort.  Chest discomfort not related to PE.  On prednisone as per pulmonary/Dr.Fleming.   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  # DISPOSITION: # follow up in 3 months- MD; labs; iron studies/ferritin/LDH;CRP--Dr.B  All questions were answered. The patient knows to call the clinic with any problems, questions or concerns.    Earna Coder, MD 01/13/2020 10:15 AM

## 2020-01-11 NOTE — Progress Notes (Signed)
Pt in for follow up, reports since prednisone decreased this week having more pain in back and tightness down right side of abdomen.  Pt reports getting short of breath on minimal exertion.

## 2020-01-11 NOTE — Assessment & Plan Note (Addendum)
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  Recommend 6 months of anticoagulation [given recent worsening pulmonary infiltrates//compromised lung function]; patient tolerating Eliquis well.  Will call for any refills if needed.  Okay to continue Mirena-progesterone only IUD.  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  We will repeat blood work after finishing anticoagulation.  #OCT 2021-RIght pleural effusion s/p Thora-Bilateral lung infiltrates-question related to post Covid; recommend follow-up with pulmonary.  CRP improving.  Likely causing of her chest discomfort.  Chest discomfort not related to PE.  On prednisone as per pulmonary/Dr.Fleming.   #Anemia likely secondary chronic disease -no obvious evidence of iron deficiency; CRP improving.  # DISPOSITION: # follow up in 3 months- MD; labs; iron studies/ferritin/LDH;CRP--Dr.B

## 2020-01-16 ENCOUNTER — Telehealth: Payer: Self-pay | Admitting: Pharmacist

## 2020-01-16 NOTE — Telephone Encounter (Signed)
Patient has [employer]. No additional medication assistance will be provided by [facility] without the required proof of non insurance coverage. Patient notified by letter. Marquette Saa Administrative Assistant [facility]

## 2020-01-21 ENCOUNTER — Encounter: Payer: Self-pay | Admitting: Physician Assistant

## 2020-01-21 ENCOUNTER — Other Ambulatory Visit: Payer: Self-pay

## 2020-01-21 ENCOUNTER — Ambulatory Visit (INDEPENDENT_AMBULATORY_CARE_PROVIDER_SITE_OTHER): Payer: BC Managed Care – PPO | Admitting: Physician Assistant

## 2020-01-21 VITALS — BP 144/91 | HR 88 | Temp 97.6°F | Ht 66.0 in | Wt 278.1 lb

## 2020-01-21 DIAGNOSIS — Z0000 Encounter for general adult medical examination without abnormal findings: Secondary | ICD-10-CM | POA: Diagnosis not present

## 2020-01-21 DIAGNOSIS — R071 Chest pain on breathing: Secondary | ICD-10-CM

## 2020-01-21 DIAGNOSIS — F419 Anxiety disorder, unspecified: Secondary | ICD-10-CM | POA: Diagnosis not present

## 2020-01-21 DIAGNOSIS — I2693 Single subsegmental pulmonary embolism without acute cor pulmonale: Secondary | ICD-10-CM | POA: Diagnosis not present

## 2020-01-21 DIAGNOSIS — F418 Other specified anxiety disorders: Secondary | ICD-10-CM

## 2020-01-21 DIAGNOSIS — R0602 Shortness of breath: Secondary | ICD-10-CM

## 2020-01-21 DIAGNOSIS — I10 Essential (primary) hypertension: Secondary | ICD-10-CM

## 2020-01-21 MED ORDER — LOSARTAN POTASSIUM 50 MG PO TABS: 50.0000 mg | ORAL_TABLET | Freq: Every day | ORAL | 1 refills | Status: DC

## 2020-01-21 MED ORDER — LOSARTAN POTASSIUM-HCTZ 50-12.5 MG PO TABS: 1.0000 | ORAL_TABLET | Freq: Every day | ORAL | 1 refills | Status: AC

## 2020-01-21 NOTE — Progress Notes (Signed)
Complete physical exam   Patient: Alexandra Fleming   DOB: [birth year]   29 y.o. Female  MRN: [id number] Visit Date: 01/21/2020  Today's healthcare provider: Trey Sailors, PA-C   Chief Complaint  Patient presents with  . Annual Exam  . Hypertension  . Anxiety   Subjective    Alexandra Fleming is a 29 y.o. female who presents today for a complete physical exam.  She reports consuming a general and high protein diet. The patient does not participate in regular exercise at present. She generally feels poorly. She reports sleeping poorly. She does have additional problems to discuss today.  HPI   Patient has a history of bilateral pneumonia, right sided pleural effusion and bilateral pulmonary embolism. She was hospitalized 12/19/2019 for approximately a week for bilateral pneumonia and bilateral PE. She was discharged and treated with eliquis. A month later, she had worsening chest pain and SOB. CTA showed severely worsening PNA with right pleural effusion requiring thoracentesis. She was again treated with abx and continued on Eliquis. Thoracentesis showed eosinophilia. There has been some question if these episodes have been related to post covid/ antiphospholipid antibodies/lupus. She remains on eliquis which is managed by hematoogy and also prednisone which is managed by [facility] pulmonology.   Now she is experiencing SOB when having a short conversation, walking in the house, going to church and doing normal activities she previously tolerated. She reports microwaving good and travelling to home office got her winded. This hs been worsening since the past 2-3 weeks. She has several episodes of hemoptysis through out the week.  Hypertension, follow-up  BP Readings from Last 3 Encounters:  01/21/20 (!) 144/91  01/11/20 123/85  12/27/19 129/83   Wt Readings from Last 3 Encounters:  01/21/20 278 lb 1.6 oz (126.1 kg)  01/11/20 271 lb 9.6 oz (123.2 kg)  12/27/19  274 lb 9.6 oz (124.6 kg)     She was last seen for hypertension 3 weeks ago.  BP at that visit was 129/83. Management since that visit includes continue current medication.  She reports good compliance with treatment. She is not having side effects.  She is following a Regular diet. She is not exercising. She does not smoke.  Use of agents associated with hypertension: none.   Outside blood pressures are not being checked. Symptoms: No chest pain No chest pressure  No palpitations No syncope  No dyspnea No orthopnea  No paroxysmal nocturnal dyspnea No lower extremity edema   Pertinent labs: Lab Results  Component Value Date   CHOL 172 04/18/2018   HDL 58 04/18/2018   LDLCALC 99 04/18/2018   TRIG 75 04/18/2018   CHOLHDL 2.3 03/18/2017   Lab Results  Component Value Date   NA 137 01/11/2020   K 4.3 01/11/2020   CREATININE 0.86 01/11/2020   GFRNONAA >60 01/11/2020   GFRAA >60 11/21/2019   GLUCOSE 109 (H) 01/11/2020     The ASCVD Risk score (Goff DC Jr., et al., 2013) failed to calculate for the following reasons:   The 2013 ASCVD risk score is only valid for ages 33 to 57   --------------------------------------------------------------------------------------------------- Anxiety, Follow-up  She was last seen for anxiety 6 months ago. Changes made at last visit include continue medication.   She reports good compliance with treatment. She reports good tolerance of treatment. She is not having side effects.   She feels her anxiety is mild and Unchanged since last visit.  Symptoms: No chest pain Yes  difficulty concentrating  No dizziness No fatigue  Yes feelings of losing control No insomnia  Yes irritable No palpitations  No panic attacks Yes racing thoughts  Yes shortness of breath No sweating  No tremors/shakes    GAD-7 Results GAD-7 Generalized Anxiety Disorder Screening Tool 07/19/2019 03/12/2019 01/18/2018  1. Feeling Nervous, Anxious, or on Edge 3 3 3     2. Not Being Able to Stop or Control Worrying 3 3 3   3. Worrying Too Much About Different Things 3 3 3   4. Trouble Relaxing 3 3 3   5. Being So Restless it's Hard To Sit Still 3 3 2   6. Becoming Easily Annoyed or Irritable 3 2 3   7. Feeling Afraid As If Something Awful Might Happen 3 3 3   Total GAD-7 Score 21 20 20   Difficulty At Work, Home, or Getting  Along With Others? Very difficult Very difficult Somewhat difficult    PHQ-9 Scores PHQ9 SCORE ONLY 12/27/2019 07/19/2019 04/03/2019  PHQ-9 Total Score 22 18 7     ---------------------------------------------------------------------------------------------------   Past Medical History:  Diagnosis Date  . COVID-19 virus infection   . Depression   . HTN (hypertension)    Past Surgical History:  Procedure Laterality Date  . WISDOM TOOTH EXTRACTION     Social History   Socioeconomic History  . Marital status: Married    Spouse name: Not on file  . Number of children: Not on file  . Years of education: Not on file  . Highest education level: Not on file  Occupational History  . Not on file  Tobacco Use  . Smoking status: Never Smoker  . Smokeless tobacco: Never Used  Substance and Sexual Activity  . Alcohol use: Yes    Alcohol/week: 6.0 standard drinks    Types: 2 Glasses of wine, 4 Cans of beer per week  . Drug use: No  . Sexual activity: Not on file  Other Topics Concern  . Not on file  Social History Narrative   No smoking; ocassional alcohol. Used to be ; . Live sin graham/husband.    Social Determinants of Health   Financial Resource Strain: Medium Risk  . Difficulty of Paying Living Expenses: Somewhat hard  Food Insecurity: No Food Insecurity  . Worried About in the Last Year: Never true  . Ran Out of Food in the Last Year: Never true  Transportation Needs: No Transportation Needs  . Lack of Transportation (Medical): No  . Lack of  Transportation (Non-Medical): No  Physical Activity: Inactive  . Days of Exercise per Week: 0 days  . Minutes of Exercise per Session: 0 min  Stress:   . Feeling of Stress : Not on file  Social Connections: Moderately Isolated  . Frequency of Communication with Friends and Family: More than three times a week  . Frequency of Social Gatherings with Friends and Family: More than three times a week  . Attends Religious Services: Never  . Active Member of Clubs or Organizations: No  . Attends Meetings: Never  . Marital Status: Married  Violence:   . Fear of Current or Ex-Partner: Not on file  . Emotionally Abused: Not on file  . Physically Abused: Not on file  . Sexually Abused: Not on file   Family Status  Relation Name Status  . Mother  Alive  . Father  Alive  . MGM  Alive  . MGF  Deceased  . PGM  Alive  . PGF  (Not Specified)  . Emelda Brothers  (Not Specified)  . Mat Uncle  (Not Specified)   Family History  Problem Relation Age of Onset  . Allergies Mother   . Breast cancer Mother 10       DCIS  . High Cholesterol Father   . Hypertension Father   . Sleep apnea Father   . Diabetes Maternal Grandmother   . Hypertension Maternal Grandmother   . COPD Maternal Grandfather   . Thyroid disease Paternal Grandmother   . Hypertension Paternal Grandmother   . Hyperlipidemia Paternal Grandmother   . Sleep apnea Paternal Grandfather   . Hypertension Paternal Grandfather   . Hyperlipidemia Paternal Grandfather   . Hypertension Paternal Aunt   . Anxiety disorder Paternal Aunt   . Hypertension Maternal Uncle    Allergies  Allergen Reactions  . Kiwi Extract Rash  . Amoxicillin   . Penicillins     Patient Care Team: Maryella Shivers as PCP - General (Physician Assistant)   Medications: Outpatient Medications Prior to Visit  Medication Sig  . apixaban (ELIQUIS) 5 MG TABS tablet Take 2 tablets (10mg ) twice daily for 7 days, then 1  tablet (5mg ) twice daily  . chlorpheniramine-HYDROcodone (TUSSIONEX) 10-8 MG/5ML SUER Take 5 mLs by mouth every 12 (twelve) hours as needed for cough.  . escitalopram (LEXAPRO) 20 MG tablet TAKE 1 TABLET(20 MG) BY MOUTH DAILY  . famotidine (PEPCID) 20 MG tablet Take 1 tablet (20 mg total) by mouth daily.  . hydrOXYzine (ATARAX/VISTARIL) 10 MG tablet Take 10 mg by mouth 3 (three) times daily as needed.   losartan (COZAAR) 50 MG tablet Take 1 tablet (50 mg total) by mouth at bedtime.  losartan-hydrochlorothiazide (HYZAAR) 50-12.5 MG tablet TAKE 1 TABLET BY MOUTH DAILY   No facility-administered medications prior to visit.    Review of Systems  Constitutional: Negative.   HENT: Negative.   Eyes: Negative.   Respiratory: Positive for shortness of breath and wheezing.   Cardiovascular: Negative.   Gastrointestinal: Negative.   Endocrine: Negative.   Genitourinary: Negative.   Musculoskeletal: Negative.   Skin: Negative.   Allergic/Immunologic: Negative.   Neurological: Negative.   Hematological: Negative.   Psychiatric/Behavioral: Positive for agitation, decreased concentration and sleep disturbance. The patient is nervous/anxious.       Objective    BP (!) 144/91 (BP Location: Left Arm, Patient Position: Sitting, Cuff Size: Large)   Pulse 88   Temp 97.6 F (36.4 C) (Oral)   Ht 5\' 6"  (1.676 m)   Wt 278 lb 1.6 oz (126.1 kg)   SpO2 99%   BMI 44.89 kg/m    Physical Exam Constitutional:      Appearance: Normal appearance. She is obese.  HENT:     Right Ear: Tympanic membrane, ear canal and external ear normal.     Left Ear: Tympanic membrane, ear canal and external ear normal.  Cardiovascular:     Rate and Rhythm: Normal rate and regular rhythm.     Pulses: Normal pulses.     Heart sounds: Normal heart sounds.  Pulmonary:     Effort: Pulmonary effort is normal.     Breath sounds: Normal breath sounds.     Comments: Lung sounds distant Abdominal:     General:  Abdomen is flat. Bowel sounds are normal.     Palpations: Abdomen is soft.  Skin:    General: Skin is warm and dry.  Neurological:  General: No focal deficit present.     Mental Status: She is alert and oriented to person, place, and time.  Psychiatric:        Mood and Affect: Mood normal.        Behavior: Behavior normal.       Last depression screening scores PHQ 2/9 Scores 12/27/2019 07/19/2019 04/03/2019  PHQ - 2 Score 4 5 2   PHQ- 9 Score 22 18 7    Last fall risk screening Fall Risk  12/27/2019  Falls in the past year? 0  Number falls in past yr: 0  Injury with Fall? 0  Risk for fall due to : No Fall Risks  Follow up Falls evaluation completed   Last Audit-C alcohol use screening Alcohol Use Disorder Test (AUDIT) 12/27/2019  1. How often do you have a drink containing alcohol? 4  2. How many drinks containing alcohol do you have on a typical day when you are drinking? 1  3. How often do you have six or more drinks on one occasion? 1  AUDIT-C Score 6  4. How often during the last year have you found that you were not able to stop drinking once you had started? 0  5. How often during the last year have you failed to do what was normally expected from you because of drinking? 0  6. How often during the last year have you needed a first drink in the morning to get yourself going after a heavy drinking session? 0  7. How often during the last year have you had a feeling of guilt of remorse after drinking? 0  8. How often during the last year have you been unable to remember what happened the night before because you had been drinking? 0  9. Have you or someone else been injured as a result of your drinking? 0  10. Has a relative or friend or a doctor or another health worker been concerned about your drinking or suggested you cut down? 0  Alcohol Use Disorder Identification Test Final Score (AUDIT) 6   A score of 3 or more in women, and 4 or more in men indicates increased risk  for alcohol abuse, EXCEPT if all of the points are from question 1   No results found for any visits on 01/21/20.  Assessment & Plan    Routine Health Maintenance and Physical Exam  Exercise Activities and Dietary recommendations Goals    .  "I need to find a counselor  for my anxiety (pt-stated)      Current Barriers:  Marland Kitchen. Mental Health Concerns   Clinical Social Work Clinical Goal(s):  Marland Kitchen. Over the next 90 days, patient will work with her insurance company and her employee assistance program  to address needs related to mental health follow up.  Interventions: . Followed up with patient regarding contact with her Employee Assistance Program and her insurance company for in network mental health providers . Patient discussed recently testing positive for COVID-19 and now has pneumonia . Patient discussed that due to her current medical condition, she has not had a moment to make any phone calls regarding mental health providers . Reinforced need to take care of herself physically and recommended that she focus on getting better medically . Discussed plan to follow up with patient in approximately 3 weeks regarding her mental health needs . Discussed plans with patient for ongoing care management follow up and provided patient with direct contact information for care management team  Patient Self Care Activities:  . Patient verbalizes understanding of plan to follow up with her Employee Assistance Program and her insurance company for a list of in network providers  . Lack of knowledge regarding affordable mental health options  Please see past updates related to this goal by clicking on the "Past Updates" button in the selected goal         Immunization History  Administered Date(s) Administered  . Hepatitis B 12/04/1999, 01/08/2000, 06/17/2000  . Influenza,inj,Quad PF,6+ Mos 01/18/2018, 01/06/2020  . Meningococcal Conjugate 06/03/2006  . Tdap 04/15/2017    Health Maintenance    Topic Date Due  . Hepatitis C Screening  Never done  . COVID-19 Vaccine (1) Never done  . PAP SMEAR-Modifier  04/15/2020  . TETANUS/TDAP  04/16/2027  . INFLUENZA VACCINE  Completed  . HIV Screening  Completed    Discussed health benefits of physical activity, and encouraged her to engage in regular exercise appropriate for her age and condition.  1. Annual physical exam   2. Single subsegmental pulmonary embolism without acute cor pulmonale (HCC)   3. Shortness of breath   4. Depression with anxiety   5. Primary hypertension  - losartan (COZAAR) 50 MG tablet; Take 1 tablet (50 mg total) by mouth at bedtime.  Dispense: 90 tablet; Refill: 1  6. Anxiety   7. Essential hypertension  - losartan-hydrochlorothiazide (HYZAAR) 50-12.5 MG tablet; Take 1 tablet by mouth daily.  Dispense: 90 tablet; Refill: 1  8. SOB (shortness of breath)   9. Chest pain on breathing  Concerned for new or worsening pulmonary embolism, pneumonia or effusion especially based on her history of recent and prolonged illness. Will order CTA.   - CT Angio Chest W/Cm &/Or Wo Cm   No follow-ups on file.     ITrey Sailors, PA-C, have reviewed all documentation for this visit. The documentation on 01/21/20 for the exam, diagnosis, procedures, and orders are all accurate and complete.  The entirety of the information documented in the History of Present Illness, Review of Systems and Physical Exam were personally obtained by me. Portions of this information were initially documented by [facility] and reviewed by me for thoroughness and accuracy.     Maryella Shivers  [facility] [phone] (phone) [phone] (fax)  [facility]

## 2020-01-22 ENCOUNTER — Ambulatory Visit
Admission: RE | Admit: 2020-01-22 | Discharge: 2020-01-22 | Disposition: A | Discharge status: Home / Self Care | Payer: BC Managed Care – PPO | Source: Ambulatory Visit | Attending: Physician Assistant | Admitting: Physician Assistant

## 2020-01-22 DIAGNOSIS — R071 Chest pain on breathing: Secondary | ICD-10-CM | POA: Diagnosis present

## 2020-01-22 MED ORDER — IOHEXOL 350 MG/ML SOLN
75.0000 mL | Freq: Once | INTRAVENOUS | Status: AC | PRN
  Administered 2020-01-22: 75 mL via INTRAVENOUS

## 2020-01-22 MED ORDER — HYDROCODONE-ACETAMINOPHEN 5-325 MG PO TABS: 1.0000 | ORAL_TABLET | Freq: Three times a day (TID) | ORAL | 0 refills | Status: AC | PRN

## 2020-01-22 NOTE — Addendum Note (Signed)
Addended by: Trey Sailors on: 01/22/2020 01:36 PM   Modules accepted: Orders

## 2020-01-30 ENCOUNTER — Encounter: Payer: Self-pay | Admitting: Physician Assistant

## 2020-01-30 ENCOUNTER — Telehealth: Payer: Self-pay | Admitting: Physician Assistant

## 2020-01-30 DIAGNOSIS — G4733 Obstructive sleep apnea (adult) (pediatric): Secondary | ICD-10-CM | POA: Insufficient documentation

## 2020-01-30 NOTE — Telephone Encounter (Signed)
Sleep study received from [facility] and severe OSA noted in chart.

## 2020-02-02 ENCOUNTER — Other Ambulatory Visit: Payer: Self-pay | Admitting: Physician Assistant

## 2020-02-02 DIAGNOSIS — I2699 Other pulmonary embolism without acute cor pulmonale: Secondary | ICD-10-CM

## 2020-02-02 NOTE — Telephone Encounter (Signed)
Requested Prescriptions  Pending Prescriptions Disp Refills   ELIQUIS 5 MG TABS tablet [Pharmacy Med Name: ELIQUIS 5MG  TABLETS] 180 tablet 1    Sig: TAKE 2 TABLETS(10 MG) BY MOUTH TWICE DAILY FOR 7 DAYS THEN TAKE 1 TABLET(5 MG) BY MOUTH TWICE DAILY     Hematology:  Anticoagulants Failed - 02/02/2020  9:39 AM      Failed - HGB in normal range and within 360 days    Hemoglobin  Date Value Ref Range Status  01/11/2020 9.2 (L) 12.0 - 15.0 g/dL Final    Comment:    Reticulocyte Hemoglobin testing may be clinically indicated, consider ordering this additional test 01/13/2020   04/18/2018 13.7 11.1 - 15.9 g/dL Final         Failed - HCT in normal range and within 360 days    HCT  Date Value Ref Range Status  01/11/2020 30.2 (L) 36.0 - 46.0 % Final   Hematocrit  Date Value Ref Range Status  04/18/2018 40.5 34.0 - 46.6 % Final         Passed - PLT in normal range and within 360 days    Platelets  Date Value Ref Range Status  01/11/2020 230 150 - 400 K/uL Final  04/18/2018 304 150 - 450 x10E3/uL Final         Passed - Cr in normal range and within 360 days    Creatinine, Ser  Date Value Ref Range Status  01/11/2020 0.86 0.44 - 1.00 mg/dL Final         Passed - Valid encounter within last 12 months    Recent Outpatient Visits          1 week ago Annual physical exam   [facility] [place], [place], PA-C   1 month ago Community acquired pneumonia, bilateral   Lavella Hammock, [place], [place]   2 months ago Anemia, unspecified type   [facility] [place], [place], PA-C   6 months ago Anxiety   [facility], M   9 months ago Pneumonia due to COVID-19 virus   [place], [employer], PA-C      Future Appointments            In 5 months Lavella Hammock, Jodi Marble, PA-C Lavella Hammock, [facility]

## 2020-02-04 ENCOUNTER — Encounter: Payer: Self-pay | Admitting: Internal Medicine

## 2020-02-07 ENCOUNTER — Other Ambulatory Visit: Payer: Self-pay | Admitting: Physician Assistant

## 2020-02-07 DIAGNOSIS — F419 Anxiety disorder, unspecified: Secondary | ICD-10-CM

## 2020-02-07 MED ORDER — ESCITALOPRAM OXALATE 20 MG PO TABS: 20.0000 mg | ORAL_TABLET | Freq: Every day | ORAL | 1 refills | Status: DC

## 2020-02-13 ENCOUNTER — Encounter: Payer: Self-pay | Admitting: Physician Assistant

## 2020-02-18 ENCOUNTER — Ambulatory Visit: Payer: Self-pay

## 2020-02-18 NOTE — Telephone Encounter (Signed)
FYI

## 2020-02-18 NOTE — Telephone Encounter (Signed)
Patient called stating that she is SOB, coughing and tight in her chest when waking this AM.   She has been hospitalized and has a pulmonolgist for SOB.  She states that they Dx in September a PE. She states that the doctors are unsure why she has the lung and breathing issues.  She states that today she is severely SOB. No fever or other symptoms . Per protocol patient will go to ER for evaluation.  She has family that will transport.   Reason for Disposition . [1] MODERATE difficulty breathing (e.g., speaks in phrases, SOB even at rest, pulse 100-120) AND [2] NEW-onset or WORSE than normal  Answer Assessment - Initial Assessment Questions 1. RESPIRATORY STATUS: "Describe your breathing?" (e.g., wheezing, shortness of breath, unable to speak, severe coughing)      Coughing tight chest 2. ONSET: "When did this breathing problem begin?"      This am 3. PATTERN "Does the difficult breathing come and go, or has it been constant since it started?"      constant 4. SEVERITY: "How bad is your breathing?" (e.g., mild, moderate, severe)    - MILD: No SOB at rest, mild SOB with walking, speaks normally in sentences, can lay down, no retractions, pulse < 100.    - MODERATE: SOB at rest, SOB with minimal exertion and prefers to sit, cannot lie down flat, speaks in phrases, mild retractions, audible wheezing, pulse 100-120.    - SEVERE: Very SOB at rest, speaks in single words, struggling to breathe, sitting hunched forward, retractions, pulse > 120      severe 5. RECURRENT SYMPTOM: "Have you had difficulty breathing before?" If Yes, ask: "When was the last time?" and "What happened that time?"      Yes hospitalized 6. CARDIAC HISTORY: "Do you have any history of heart disease?" (e.g., heart attack, angina, bypass surgery, angioplasty)      none 7. LUNG HISTORY: "Do you have any history of lung disease?"  (e.g., pulmonary embolus, asthma, emphysema)    September pumonary embolism 8. CAUSE: "What do you  think is causing the breathing problem?"      Unsure 9. OTHER SYMPTOMS: "Do you have any other symptoms? (e.g., dizziness, runny nose, cough, chest pain, fever)   no 10. PREGNANCY: "Is there any chance you are pregnant?" "When was your last menstrual period?"     No IUD 11. TRAVEL: "Have you traveled out of the country in the last month?" (e.g., travel history, exposures)      No  Protocols used: BREATHING DIFFICULTY-A-AH

## 2020-02-26 ENCOUNTER — Telehealth: Payer: Self-pay

## 2020-02-26 NOTE — Telephone Encounter (Signed)
FYI

## 2020-02-26 NOTE — Telephone Encounter (Signed)
Copied from CRM [id number]. Topic: General - Inquiry >> Feb 26, 2020  9:08 AM Daphine Deutscher D wrote: Reason for CRM: Renae Fickle with [employer] called to say the patient has enrolled in the nurse assistance program.  Nurse contact Larina Earthly [phone] in case you need to reach out regarding patient.

## 2020-03-19 ENCOUNTER — Other Ambulatory Visit: Payer: Self-pay | Admitting: Physician Assistant

## 2020-03-19 ENCOUNTER — Encounter: Payer: Self-pay | Admitting: Physician Assistant

## 2020-03-19 DIAGNOSIS — I10 Essential (primary) hypertension: Secondary | ICD-10-CM

## 2020-03-21 ENCOUNTER — Other Ambulatory Visit: Payer: Self-pay

## 2020-03-21 MED ORDER — HYDROXYZINE HCL 10 MG PO TABS: 10.0000 mg | ORAL_TABLET | Freq: Three times a day (TID) | ORAL | 1 refills | Status: DC | PRN

## 2020-03-21 NOTE — Telephone Encounter (Signed)
Patient send a Mychart message requesting a refill on hydroxyzine 10 MG. L.O.V. was on 01/21/2020 and next appointment is on 03/28/2020.

## 2020-03-28 ENCOUNTER — Other Ambulatory Visit: Payer: Self-pay

## 2020-03-28 ENCOUNTER — Ambulatory Visit: Payer: BC Managed Care – PPO | Admitting: Physician Assistant

## 2020-03-28 ENCOUNTER — Encounter: Payer: Self-pay | Admitting: Physician Assistant

## 2020-03-28 VITALS — BP 135/88 | HR 89 | Temp 97.8°F | Wt 273.4 lb

## 2020-03-28 DIAGNOSIS — I2693 Single subsegmental pulmonary embolism without acute cor pulmonale: Secondary | ICD-10-CM

## 2020-03-28 DIAGNOSIS — G4733 Obstructive sleep apnea (adult) (pediatric): Secondary | ICD-10-CM | POA: Diagnosis not present

## 2020-03-28 DIAGNOSIS — I2699 Other pulmonary embolism without acute cor pulmonale: Secondary | ICD-10-CM

## 2020-03-28 DIAGNOSIS — F418 Other specified anxiety disorders: Secondary | ICD-10-CM

## 2020-03-28 DIAGNOSIS — F419 Anxiety disorder, unspecified: Secondary | ICD-10-CM

## 2020-03-28 DIAGNOSIS — J9601 Acute respiratory failure with hypoxia: Secondary | ICD-10-CM | POA: Diagnosis not present

## 2020-03-28 MED ORDER — FUROSEMIDE 20 MG PO TABS: 10.0000 mg | ORAL_TABLET | Freq: Every day | ORAL | 0 refills | Status: DC

## 2020-03-28 MED ORDER — LORAZEPAM 0.5 MG PO TABS: 0.5000 mg | ORAL_TABLET | Freq: Two times a day (BID) | ORAL | 0 refills | Status: DC | PRN

## 2020-03-28 NOTE — Progress Notes (Signed)
Established patient visit   Patient: Alexandra Fleming   DOB: Jul 07, 1990   30 y.o. Female  MRN: [id number] Visit Date: 03/28/2020  Today's healthcare provider: Trey Sailors, Alexandra Fleming   Chief Complaint  Patient presents with  . Anxiety   Subjective    HPI  Anxiety, Follow-up  She was last seen for anxiety 2 months ago. Changes made at last visit include none.   She reports good compliance with treatment. She reports fair to poor tolerance of treatment. She is not having side effects.   She feels her anxiety is moderate and Worse since last visit.  She is having frequent anxiety and panic attacks. Has had a long complicated health course since COVID 2021 after which she had a pulmonary embolism, bilateral pneumonia, pleural effusion. Cites work as a stressor, feels as if she is a burden, feels guilty when she is relaxing. She is currently working for [employer]. She has been on lexparo 20 mg daily for years. She is currently talking with a CBT with [facility]. Reports she will have panic attacks that wake her up. She has found some help with writing in a journal and talking with a therapist. She would like to establish with a psychiatrist and needs one with clear pricing practices.   Symptoms: No chest pain Yes difficulty concentrating  No dizziness Yes fatigue  Yes feelings of losing control No insomnia  Yes irritable No palpitations  Yes panic attacks Yes racing thoughts  Yes shortness of breath Yes sweating  Yes tremors/shakes    GAD-7 Results GAD-7 Generalized Anxiety Disorder Screening Tool 07/19/2019 03/12/2019 01/18/2018  1. Feeling Nervous, Anxious, or on Edge 3 3 3   2. Not Being Able to Stop or Control Worrying 3 3 3   3. Worrying Too Much About Different Things 3 3 3   4. Trouble Relaxing 3 3 3   5. Being So Restless it's Hard To Sit Still 3 3 2   6. Becoming Easily Annoyed or Irritable 3 2 3   7. Feeling Afraid As If Something Awful Might Happen 3 3  3   Total GAD-7 Score 21 20 20   Difficulty At Work, Home, or Getting  Along With Others? Very difficult Very difficult Somewhat difficult    PHQ-9 Scores PHQ9 SCORE ONLY 03/28/2020 12/27/2019 07/19/2019  PHQ-9 Total Score 27 22 18    Wt Readings from Last 3 Encounters:  03/28/20 273 lb 6.4 oz (124 kg)  01/21/20 278 lb 1.6 oz (126.1 kg)  01/11/20 271 lb 9.6 oz (123.2 kg)    ---------------------------------------------------------------------------------------------------      Medications: Outpatient Medications Prior to Visit  Medication Sig  . ELIQUIS 5 MG TABS tablet TAKE 2 TABLETS(10 MG) BY MOUTH TWICE DAILY FOR 7 DAYS THEN TAKE 1 TABLET(5 MG) BY MOUTH TWICE DAILY  . escitalopram (LEXAPRO) 20 MG tablet Take 1 tablet (20 mg total) by mouth daily.  . hydrOXYzine (ATARAX/VISTARIL) 10 MG tablet Take 1 tablet (10 mg total) by mouth 3 (three) times daily as needed.  losartan (COZAAR) 50 MG tablet Take 1 tablet (50 mg total) by mouth at bedtime.  losartan-hydrochlorothiazide (HYZAAR) 50-12.5 MG tablet Take 1 tablet by mouth daily.  . chlorpheniramine-HYDROcodone (TUSSIONEX) 10-8 MG/5ML SUER Take 5 mLs by mouth every 12 (twelve) hours as needed for cough. (Patient not taking: Reported on 03/28/2020)  . famotidine (PEPCID) 20 MG tablet Take 1 tablet (20 mg total) by mouth daily.   No facility-administered medications prior to visit.  Review of Systems     Objective    BP 135/88 (BP Location: Right Arm, Patient Position: Sitting, Cuff Size: Large)   Pulse 89   Temp 97.8 F (36.6 C) (Oral)   Wt 273 lb 6.4 oz (124 kg)   SpO2 100%   BMI 44.13 kg/m     Physical Exam Constitutional:      Appearance: Normal appearance.  Cardiovascular:     Rate and Rhythm: Normal rate and regular rhythm.     Heart sounds: Normal heart sounds.  Pulmonary:     Effort: Pulmonary effort is normal.     Breath sounds: Normal breath sounds.  Skin:    General: Skin is warm and dry.  Neurological:      Mental Status: She is alert and oriented to person, place, and time. Mental status is at baseline.  Psychiatric:        Mood and Affect: Mood is anxious and depressed. Affect is tearful.        Behavior: Behavior normal.       No results found for any visits on 03/28/20.  Assessment & Plan    1. Severe obstructive sleep apnea  - Comprehensive Metabolic Panel (CMET) - CBC with Differential  2. Depression with anxiety  Worsening. Refer to [facility] insurance.   - Ambulatory referral to Psychiatry  3. Anxiety  Start on short acute course of Ativan to help with acute anxiety and hopefully nausea with medication as well.   - LORazepam (ATIVAN) 0.5 MG tablet; Take 1 tablet (0.5 mg total) by mouth 2 (two) times daily as needed for anxiety.  Dispense: 60 tablet; Refill: 0 - Ambulatory referral to Psychiatry  4. Acute respiratory failure with hypoxia (HCC)   5. Single subsegmental pulmonary embolism without acute cor pulmonale (HCC)  Anemia with hemoglobin at 7.6, the same as 02/18/2020 labs at [facility]. I have messaged her hematologist about this as I would have expected this to improve somewhat since her discharge, may be contributing to her feeling overall poor.  6. Pulmonary embolism on right [facility])  Will have her try 10 mg daily. If not able to tolerate, will try every other day. Advised to touch base with pulmonology.   - furosemide (LASIX) 20 MG tablet; Take 0.5 tablets (10 mg total) by mouth daily.  Dispense: 45 tablet; Refill: 0   No follow-ups on file.      ITrey Sailors, Alexandra Fleming, have reviewed all documentation for this visit. The documentation on 04/01/20 for the exam, diagnosis, procedures, and orders are all accurate and complete.  The entirety of the information documented in the History of Present Illness, Review of Systems and Physical Exam were personally obtained by me. Portions of this information were initially documented by [facility] and  reviewed by me for thoroughness and accuracy.     Maryella Shivers  [facility] [phone] (phone) [phone] (fax)  [facility]

## 2020-03-29 LAB — COMPREHENSIVE METABOLIC PANEL
ALT: 13 IU/L (ref 0–32)
AST: 15 IU/L (ref 0–40)
Albumin/Globulin Ratio: 1.7 (ref 1.2–2.2)
Albumin: 4.3 g/dL (ref 3.8–4.8)
Alkaline Phosphatase: 72 IU/L (ref 44–121)
BUN/Creatinine Ratio: 17 (ref 9–23)
BUN: 16 mg/dL (ref 6–20)
Bilirubin Total: <0.2 mg/dL (ref 0.0–1.2)
CO2: 24 mmol/L (ref 20–29)
Calcium: 9 mg/dL (ref 8.7–10.2)
Chloride: 104 mmol/L (ref 96–106)
Creatinine, Ser: 0.93 mg/dL (ref 0.57–1.00)
GFR calc Af Amer: 95 mL/min/{1.73_m2} (ref >59)
GFR calc non Af Amer: 82 mL/min/{1.73_m2} (ref >59)
Globulin, Total: 2.6 g/dL (ref 1.5–4.5)
Glucose: 98 mg/dL (ref 65–99)
Potassium: 3.8 mmol/L (ref 3.5–5.2)
Sodium: 141 mmol/L (ref 134–144)
Total Protein: 6.9 g/dL (ref 6.0–8.5)

## 2020-03-29 LAB — CBC WITH DIFFERENTIAL/PLATELET
Basophils Absolute: 0 10*3/uL (ref 0.0–0.2)
Basos: 0 %
EOS (ABSOLUTE): 0 10*3/uL (ref 0.0–0.4)
Eos: 1 %
Hematocrit: 25.7 % — ABNORMAL LOW (ref 34.0–46.6)
Hemoglobin: 7.6 g/dL — ABNORMAL LOW (ref 11.1–15.9)
Immature Grans (Abs): 0 10*3/uL (ref 0.0–0.1)
Immature Granulocytes: 0 %
Lymphocytes Absolute: 1.4 10*3/uL (ref 0.7–3.1)
Lymphs: 18 %
MCH: 19.4 pg — ABNORMAL LOW (ref 26.6–33.0)
MCHC: 29.6 g/dL — ABNORMAL LOW (ref 31.5–35.7)
MCV: 66 fL — ABNORMAL LOW (ref 79–97)
Monocytes Absolute: 0.6 10*3/uL (ref 0.1–0.9)
Monocytes: 7 %
Neutrophils Absolute: 6 10*3/uL (ref 1.4–7.0)
Neutrophils: 74 %
Platelets: 303 10*3/uL (ref 150–450)
RBC: 3.91 x10E6/uL (ref 3.77–5.28)
RDW: 15.5 % — ABNORMAL HIGH (ref 11.7–15.4)
WBC: 8.1 10*3/uL (ref 3.4–10.8)

## 2020-04-11 ENCOUNTER — Inpatient Hospital Stay: Payer: BC Managed Care – PPO | Attending: Internal Medicine

## 2020-04-11 ENCOUNTER — Inpatient Hospital Stay: Payer: BC Managed Care – PPO | Admitting: Internal Medicine

## 2020-04-11 ENCOUNTER — Encounter: Payer: Self-pay | Admitting: Internal Medicine

## 2020-04-11 DIAGNOSIS — I2699 Other pulmonary embolism without acute cor pulmonale: Secondary | ICD-10-CM | POA: Insufficient documentation

## 2020-04-11 DIAGNOSIS — D509 Iron deficiency anemia, unspecified: Secondary | ICD-10-CM | POA: Insufficient documentation

## 2020-04-11 DIAGNOSIS — Z8616 Personal history of COVID-19: Secondary | ICD-10-CM | POA: Diagnosis not present

## 2020-04-11 DIAGNOSIS — Z7901 Long term (current) use of anticoagulants: Secondary | ICD-10-CM | POA: Insufficient documentation

## 2020-04-11 LAB — COMPREHENSIVE METABOLIC PANEL
ALT: 16 U/L (ref 0–44)
AST: 17 U/L (ref 15–41)
Albumin: 3.9 g/dL (ref 3.5–5.0)
Alkaline Phosphatase: 63 U/L (ref 38–126)
Anion gap: 11 (ref 5–15)
BUN: 15 mg/dL (ref 6–20)
CO2: 24 mmol/L (ref 22–32)
Calcium: 9.1 mg/dL (ref 8.9–10.3)
Chloride: 100 mmol/L (ref 98–111)
Creatinine, Ser: 0.8 mg/dL (ref 0.44–1.00)
GFR, Estimated: >60 mL/min (ref >60)
Glucose, Bld: 112 mg/dL — ABNORMAL HIGH (ref 70–99)
Potassium: 3.7 mmol/L (ref 3.5–5.1)
Sodium: 135 mmol/L (ref 135–145)
Total Bilirubin: 0.5 mg/dL (ref 0.3–1.2)
Total Protein: 7.4 g/dL (ref 6.5–8.1)

## 2020-04-11 LAB — CBC WITH DIFFERENTIAL/PLATELET
Abs Immature Granulocytes: 0.04 10*3/uL (ref 0.00–0.07)
Abs Immature Granulocytes: 0.06 10*3/uL (ref 0.00–0.07)
Basophils Absolute: 0 10*3/uL (ref 0.0–0.1)
Basophils Absolute: 0 10*3/uL (ref 0.0–0.1)
Basophils Relative: 0 %
Basophils Relative: 0 %
Eosinophils Absolute: 0.1 10*3/uL (ref 0.0–0.5)
Eosinophils Absolute: 0.1 10*3/uL (ref 0.0–0.5)
Eosinophils Relative: 1 %
Eosinophils Relative: 1 %
HCT: 27.1 % — ABNORMAL LOW (ref 36.0–46.0)
HCT: 27.3 % — ABNORMAL LOW (ref 36.0–46.0)
Hemoglobin: 7.8 g/dL — ABNORMAL LOW (ref 12.0–15.0)
Hemoglobin: 7.8 g/dL — ABNORMAL LOW (ref 12.0–15.0)
Immature Granulocytes: 1 %
Immature Granulocytes: 1 %
Lymphocytes Relative: 16 %
Lymphocytes Relative: 18 %
Lymphs Abs: 1.3 10*3/uL (ref 0.7–4.0)
Lymphs Abs: 1.6 10*3/uL (ref 0.7–4.0)
MCH: 19.7 pg — ABNORMAL LOW (ref 26.0–34.0)
MCH: 19.5 pg — ABNORMAL LOW (ref 26.0–34.0)
MCHC: 28.8 g/dL — ABNORMAL LOW (ref 30.0–36.0)
MCHC: 28.6 g/dL — ABNORMAL LOW (ref 30.0–36.0)
MCV: 68.4 fL — ABNORMAL LOW (ref 80.0–100.0)
MCV: 68.4 fL — ABNORMAL LOW (ref 80.0–100.0)
Monocytes Absolute: 0.4 10*3/uL (ref 0.1–1.0)
Monocytes Absolute: 0.5 10*3/uL (ref 0.1–1.0)
Monocytes Relative: 5 %
Monocytes Relative: 6 %
Neutro Abs: 6.4 10*3/uL (ref 1.7–7.7)
Neutro Abs: 6.5 10*3/uL (ref 1.7–7.7)
Neutrophils Relative %: 77 %
Neutrophils Relative %: 74 %
Platelets: 321 10*3/uL (ref 150–400)
Platelets: 326 10*3/uL (ref 150–400)
RBC: 3.96 MIL/uL (ref 3.87–5.11)
RBC: 3.99 MIL/uL (ref 3.87–5.11)
RDW: 15.8 % — ABNORMAL HIGH (ref 11.5–15.5)
RDW: 15.7 % — ABNORMAL HIGH (ref 11.5–15.5)
WBC: 8.4 10*3/uL (ref 4.0–10.5)
WBC: 8.9 10*3/uL (ref 4.0–10.5)
nRBC: 0 % (ref 0.0–0.2)
nRBC: 0 % (ref 0.0–0.2)

## 2020-04-11 LAB — IRON AND TIBC
Iron: 16 ug/dL — ABNORMAL LOW (ref 28–170)
Saturation Ratios: 3 % — ABNORMAL LOW (ref 10.4–31.8)
TIBC: 582 ug/dL — ABNORMAL HIGH (ref 250–450)
UIBC: 566 ug/dL

## 2020-04-11 LAB — C-REACTIVE PROTEIN: CRP: 1.9 mg/dL — ABNORMAL HIGH (ref <1.0)

## 2020-04-11 LAB — FERRITIN: Ferritin: 4 ng/mL — ABNORMAL LOW (ref 11–307)

## 2020-04-11 LAB — D-DIMER, QUANTITATIVE: D-Dimer, Quant: <0.27 ug/mL-FEU (ref 0.00–0.50)

## 2020-04-11 LAB — LACTATE DEHYDROGENASE: LDH: 178 U/L (ref 98–192)

## 2020-04-11 NOTE — Progress Notes (Signed)
Concerned that blood count is really low. Has SOB, headaches, and feels fatigue all the time. Went to the eye doctor last week and she was told it look like there were times that there was not enough blood flow to her left eye. Has numbness and tingling in hands and feet that has increased and become more noticeable within the last 3 weeks.

## 2020-04-11 NOTE — Assessment & Plan Note (Addendum)
#  Pulmonary embolism right lobar arteries- [sep 25th, 2021]/pulmonary infiltrate [s/p bronchoscopy; [facility] Jan 2022]-hemosiderin noted.  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   [facility] CT scan February 14 th-continued groundglass opacities-  Awaiting repeat appointment with pulmonary in 2 weeks. On Eliquis.  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  Highly clinically suspicious that antiphospholipid antibody syndrome that could be causing patient's respiratory disease [pulmonary infiltrates/hemorrhage/pleurisy/serositis].  Recommend checking repeat antiphospholipid antibody work-up [patient on Eliquis].   #Anemia secondary to iron deficiency--hemoglobin 7.8; February 2022-iron saturation 3% ferritin 4.  Recommend IV Venofer.  # 40 minutes face-to-face with the patient discussing the above plan of care; more than 50% of time spent on prognosis/ natural history; counseling and coordination.  # DISPOSITION: # labs today- # follow up TBD-Dr.B  Addendum: on 2/19-spoke to patient regarding concerns for antiphospholipid antibody syndrome causing alveolar hemorrhages.  Patient declines evaluation in the emergency room.  Plan high dose steroids [1mg /kg- 120mg /day].  As per patient poor tolerance to steroids-mood swings etc.  Patient understands high dose of steroids should be short-term.  She might need other immunosuppressive therapies like Cytoxan rituximab etc.  I would recommend starting Lovenox however logistical issue/cost insurance and also administration.  Continue Eliquis for now.    I have asked the patient to reach out to her pulmonary team at [facility] given my concerns.  I have also sent an email to her physicians [Dr.Maguire/Dr.Witt].  Patient understand that she will need urgent rheumatology/hematology evaluation to help treat her rare/life-threatening condition.

## 2020-04-11 NOTE — Progress Notes (Signed)
[facility] CONSULT NOTE  Patient Care Team: Maryella ShiversPollak, Adriana M, PA-C as PCP - General (Physician Assistant)  CHIEF COMPLAINTS/PURPOSE OF CONSULTATION: DVT/PE  # SEP 25th, 2021- Acute pulmonary embolism in posterior intralobar branches of right lower lobe pulmonary artery; Right lower lobe airspace disease-pneumonia or pulmonary infarct. Mild infiltrate or atelectasis in posterior left lower lobe; PRIOR TO HEPARIN [drawn in ER ] APTT- 45; lupus anticoagulant/DRVVT- POSITIVE; Anticardiolipin IgG-HIGH; IgM- indeterminate; Beta2 GP- IgG- HIGH; PTT-LA- POSITIVE; HEXAGONALPHASE- POSITIVE  # ANEMIA- Hb 7.8 [sep 2021]; iron saturation 2% ferritin-77 s/p PRBC transfusion; Venofer;[in Hospital] ? Anemia of Chronic disease- monitor  # COVID-19 [Jan 2021; NO mabs therapy] s/p Vaccine; SEP 2021- RLL Pneumonia; OCT 2021- CT worsening infiltrates [post COVID]- inflammation- on prednisone [Dr.FLeming]  Oncology History   No history exists.     HISTORY OF PRESENTING ILLNESS:  Alexandra Fleming 30 y.o.  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  [facility]; [facility]  Most recently in December 2021-patient underwent bronchoscopy/BAL-suggestive of alveolar hemorrhage.  Also most recently patient had a CT scan on February 14 that showed bilateral groundglass opacities-differential including hemorrhage/infection.  [facility]  Patient continues to have episodes of worsening chest pain pleuritic; and also shortness of breath on exertion.  She is currently on prednisone 5 mg once a day. Also in eliquis 5 mg BID.   Is currently off Lasix.   Patient has intermittent episodes of vision loss in left eye.  Evaluated by ophthalmology with no obvious diagnosis.  Patient  denies any blood in stools or black or stools.  Patient declines any current hemoptysis.   Review of Systems  Constitutional: Positive for malaise/fatigue and weight loss. Negative for chills, diaphoresis and fever.  HENT: Negative for nosebleeds and sore throat.   Eyes: Negative for double vision.  Respiratory: Positive for shortness of breath. Negative for cough, hemoptysis, sputum production and wheezing.   Cardiovascular: Positive for chest pain. Negative for palpitations, orthopnea and leg swelling.  Gastrointestinal: Positive for nausea. Negative for abdominal pain, blood in stool, constipation, diarrhea, heartburn, melena and vomiting.  Genitourinary: Negative for dysuria, frequency and urgency.  Musculoskeletal: Negative for back pain and joint pain.  Skin: Negative.  Negative for itching and rash.  Neurological: Positive for dizziness. Negative for tingling, focal weakness, weakness and headaches.       Transient loss of vision  Endo/Heme/Allergies: Does not bruise/bleed easily.  Psychiatric/Behavioral: Negative for depression. The patient is not nervous/anxious and does not have insomnia.      MEDICAL HISTORY:  Past Medical History:  Diagnosis Date  . COVID-19 virus infection   . Depression   . HTN (hypertension)     SURGICAL HISTORY: Past Surgical History:  Procedure Laterality Date  . WISDOM TOOTH EXTRACTION      SOCIAL HISTORY: Social History   Socioeconomic History  . Marital status: Married    Spouse name: Not on file  . Number of children: Not on file  . Years of education: Not on file  . Highest education level: Not on file  Occupational History  . Not on file  Tobacco Use  . Smoking status: Never Smoker  . Smokeless tobacco: Never Used  Substance and Sexual Activity  . Alcohol use: Yes    Alcohol/week: 6.0 standard drinks  Types: 2 Glasses of wine, 4 Cans of beer per week  . Drug use: No  . Sexual activity: Not on file  Other Topics Concern  .  Not on file  Social History Narrative   No smoking; ocassional alcohol. Used to be Web designer; Microbiologist. Live sin graham/husband.    Social Determinants of Health   Financial Resource Strain: Not on file  Food Insecurity: Not on file  Transportation Needs: Not on file  Physical Activity: Not on file  Stress: Not on file  Social Connections: Not on file  Intimate Partner Violence: Not on file    FAMILY HISTORY: Family History  Problem Relation Age of Onset  . Allergies Mother   . Breast cancer Mother 78       DCIS  . High Cholesterol Father   . Hypertension Father   . Sleep apnea Father   . Diabetes Maternal Grandmother   . Hypertension Maternal Grandmother   . COPD Maternal Grandfather   . Thyroid disease Paternal Grandmother   . Hypertension Paternal Grandmother   . Hyperlipidemia Paternal Grandmother   . Sleep apnea Paternal Grandfather   . Hypertension Paternal Grandfather   . Hyperlipidemia Paternal Grandfather   . Hypertension Paternal Aunt   . Anxiety disorder Paternal Aunt   . Hypertension Maternal Uncle     ALLERGIES:  is allergic to kiwi extract, amoxicillin, and penicillins.  MEDICATIONS:  Current Outpatient Medications  Medication Sig Dispense Refill  . ELIQUIS 5 MG TABS tablet TAKE 2 TABLETS(10 MG) BY MOUTH TWICE DAILY FOR 7 DAYS THEN TAKE 1 TABLET(5 MG) BY MOUTH TWICE DAILY 180 tablet 1  . escitalopram (LEXAPRO) 20 MG tablet Take 1 tablet (20 mg total) by mouth daily. 90 tablet 1  . LORazepam (ATIVAN) 0.5 MG tablet Take 1 tablet (0.5 mg total) by mouth 2 (two) times daily as needed for anxiety. 60 tablet 0  . losartan (COZAAR) 50 MG tablet Take 1 tablet (50 mg total) by mouth at bedtime. 90 tablet 1  . losartan-hydrochlorothiazide (HYZAAR) 50-12.5 MG tablet Take 1 tablet by mouth daily. 90 tablet 1  . predniSONE (DELTASONE) 20 MG tablet Take 6 pills in AM [120 mg] daily. until further instruction. 100 tablet 0   No  current facility-administered medications for this visit.      Marland Kitchen  PHYSICAL EXAMINATION:  Vitals:   04/11/20 1437  BP: 117/79  Pulse: 84  Resp: 18  Temp: 99.9 F (37.7 C)  SpO2: 100%   Filed Weights   04/11/20 1437  Weight: 276 lb (125.2 kg)    Physical Exam Constitutional:      Comments: Patient walk independently. Accompanied by her mother. No acute distress.  HENT:     Head: Normocephalic and atraumatic.     Mouth/Throat:     Pharynx: No oropharyngeal exudate.  Eyes:     Pupils: Pupils are equal, round, and reactive to light.  Cardiovascular:     Rate and Rhythm: Normal rate and regular rhythm.  Pulmonary:     Effort: Pulmonary effort is normal. No respiratory distress.     Breath sounds: Normal breath sounds. No wheezing.  Abdominal:     General: Bowel sounds are normal. There is no distension.     Palpations: Abdomen is soft. There is no mass.     Tenderness: There is no abdominal tenderness. There is no guarding or rebound.  Musculoskeletal:        General: No tenderness. Normal range of motion.  Cervical back: Normal range of motion and neck supple.  Skin:    General: Skin is warm.  Neurological:     Mental Status: She is alert and oriented to person, place, and time.  Psychiatric:        Mood and Affect: Affect normal.      LABORATORY DATA:  I have reviewed the data as listed Lab Results  Component Value Date   WBC 8.9 04/11/2020   HGB 7.8 (L) 04/11/2020   HCT 27.3 (L) 04/11/2020   MCV 68.4 (L) 04/11/2020   PLT 326 04/11/2020   Recent Labs    11/20/19 0457 11/21/19 0343 11/30/19 1155 01/11/20 1337 03/28/20 1629 04/11/20 1428  NA 135 138   < > 137 141 135  K 3.7 3.7   < > 4.3 3.8 3.7  CL 103 101   < > 101 104 100  CO2 24 25   < > 27 24 24   GLUCOSE 114* 114*   < > 109* 98 112*  BUN 8 8   < > 24* 16 15  CREATININE 0.76 0.67   < > 0.86 0.93 0.80  CALCIUM 7.9* 8.3*   < > 9.2 9.0 9.1  GFRNONAA >60 >60   < > >60 82 >60  GFRAA >60 >60   --   --  95  --   PROT  --   --    < > 7.2 6.9 7.4  ALBUMIN  --   --    < > 3.9 4.3 3.9  AST  --   --    < > 19 15 17   ALT  --   --    < > 19 13 16   ALKPHOS  --   --    < > 67 72 63  BILITOT  --   --    < > 0.5 <0.2 0.5   < > = values in this interval not displayed.    RADIOGRAPHIC STUDIES: I have personally reviewed the radiological images as listed and agreed with the findings in the report. No results found.  ASSESSMENT & PLAN:   Pulmonary embolism on right [facility]) #Pulmonary embolism right lobar arteries- [sep 25th, 2021]/pulmonary infiltrate [s/p bronchoscopy; [facility] Jan 2022]-hemosiderin noted.  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   [facility] CT scan February 14 th-continued groundglass opacities-  Awaiting repeat appointment with pulmonary in 2 weeks. On Eliquis.  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  Highly clinically suspicious that antiphospholipid antibody syndrome that could be causing patient's respiratory disease [pulmonary infiltrates/hemorrhage/pleurisy/serositis].  Recommend checking repeat antiphospholipid antibody work-up [patient on Eliquis].   #Anemia secondary to iron deficiency--hemoglobin 7.8; February 2022-iron saturation 3% ferritin 4.  Recommend IV Venofer.  # 40 minutes face-to-face with the patient discussing the above plan of care; more than 50% of time spent on prognosis/ natural history; counseling and coordination.  # DISPOSITION: # labs today- # follow up TBD-Dr.B  Addendum: on 2/19-spoke to patient regarding concerns for antiphospholipid antibody syndrome causing alveolar hemorrhages.  Patient declines evaluation in the emergency room.  Plan high dose steroids [1mg /kg- 120mg /day].  As per patient poor tolerance to steroids-mood swings etc.  Patient understands high dose of steroids should be short-term.  She might need other immunosuppressive therapies like Cytoxan rituximab etc.  I  would recommend starting Lovenox however logistical issue/cost insurance and also administration.  Continue Eliquis for now.    I have asked the patient to reach out to her pulmonary team  at [facility] given my concerns.  I have also sent an email to her physicians [Dr.Maguire/Dr.Witt].  Patient understand that she will need urgent rheumatology/hematology evaluation to help treat her rare/life-threatening condition.   All questions were answered. The patient knows to call the clinic with any problems, questions or concerns.    Earna Coder, MD 04/12/2020 11:02 AM

## 2020-04-12 MED ORDER — PREDNISONE 20 MG PO TABS: ORAL_TABLET | ORAL | 0 refills | Status: AC

## 2020-04-13 LAB — BETA-2-GLYCOPROTEIN I ABS, IGG/M/A
Beta-2 Glyco I IgG: 38 GPI IgG units — ABNORMAL HIGH (ref 0–20)
Beta-2-Glycoprotein I IgA: <9 GPI IgA units (ref 0–25)
Beta-2-Glycoprotein I IgM: <9 GPI IgM units (ref 0–32)

## 2020-04-14 ENCOUNTER — Other Ambulatory Visit: Payer: Self-pay

## 2020-04-14 ENCOUNTER — Inpatient Hospital Stay: Payer: BC Managed Care – PPO | Admitting: Internal Medicine

## 2020-04-14 ENCOUNTER — Telehealth: Payer: Self-pay | Admitting: Internal Medicine

## 2020-04-14 DIAGNOSIS — D6861 Antiphospholipid syndrome: Secondary | ICD-10-CM

## 2020-04-14 DIAGNOSIS — I2699 Other pulmonary embolism without acute cor pulmonale: Secondary | ICD-10-CM | POA: Diagnosis not present

## 2020-04-14 LAB — DRVVT MIX: dRVVT Mix: 57.3 s — ABNORMAL HIGH (ref 0.0–40.4)

## 2020-04-14 LAB — ANTIPHOSPHOLIPID SYNDROME PROF
Anticardiolipin IgG: 57 GPL U/mL — ABNORMAL HIGH (ref 0–14)
Anticardiolipin IgM: 11 MPL U/mL (ref 0–12)
DRVVT: 79 s — ABNORMAL HIGH (ref 0.0–47.0)
PTT Lupus Anticoagulant: 39.8 s (ref 0.0–51.9)

## 2020-04-14 LAB — DRVVT CONFIRM: dRVVT Confirm: 1.7 ratio — ABNORMAL HIGH (ref 0.8–1.2)

## 2020-04-14 MED ORDER — ENOXAPARIN SODIUM 100 MG/ML ~~LOC~~ SOLN: 100.0000 mg | Freq: Two times a day (BID) | SUBCUTANEOUS | 0 refills | Status: AC

## 2020-04-14 NOTE — Assessment & Plan Note (Addendum)
#  Positive antiphospholipid antibodies [sep 2021; February 2022-repeat testing pending [done on eliquis]; beta-2 glycoprotein-38 elevated.   #I reviewed the myriad symptoms from antiphospholipid antibody syndrome-involving any organ in the body.  Patient unfortunately has both procoagulant [PE] and bleeding diathesis [diffuse alveolar hemorrhage].  Discussed the treatment options will mainly involve-anticoagulation; and also immunosuppressive therapy.   #Pulmonary embolism right lobar arteries- [sep 25th, 2021] I would recommend patient start Lovenox; discontinue Eliquis.  Recommend Lovenox 0.8 mg/kg subcu twice daily.  Prescription sent.  Patient will not take Eliquis this evening; consider start Lovenox.  Given Lovenox teaching.  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 ] hemoglobin 7.8; recommend p.o. iron.  Discussed re: Constipation; black colored stool.  Recommend IV iron infusion/defer to [facility].   #Also discussed currently immunosuppressive therapy-patient is currently on prednisone 1 mg/kg; also discussed potential other therapies include-Cytoxan/CellCept/rituximab.  Defer to [facility] hematology for further recommendations.  Patient has appointment with [facility] hematology on 1/23.   # DISPOSITION: # follow up TBD-Dr.B

## 2020-04-14 NOTE — Progress Notes (Signed)
[place] CONSULT NOTE  Patient Care Team: Paulene Floor as PCP - General (Physician Assistant)  CHIEF COMPLAINTS/PURPOSE OF CONSULTATION: DVT/PE  # SEP 25th, 2021- Acute pulmonary embolism in posterior intralobar branches of right lower lobe pulmonary artery; Right lower lobe airspace disease-pneumonia or pulmonary infarct. Mild infiltrate or atelectasis in posterior left lower lobe; PRIOR TO HEPARIN [drawn in ER ] APTT- 45; lupus anticoagulant/DRVVT- POSITIVE; Anticardiolipin IgG-HIGH; IgM- indeterminate; Beta2 GP- IgG- HIGH; PTT-LA- POSITIVE; HEXAGONALPHASE- POSITIVE; FEB 2022, 21st-START LOVENOX [0.31m/kg] 100 mg SQ BID  # SEP 2021- RLL Pneumonia; OCT 2021- CT worsening infiltrates [post COVID]- inflammation- on prednisone [Dr.FLeming]; s/p Bronch [facility]; on FEB 19th- Prednsione  1211mkg  # ANEMIA- Hb 7.8 [sep 2021]; iron saturation 2% ferritin-77 s/p PRBC transfusion; Venofer;[in Hospital] ? Anemia of Chronic disease- monitor  # COLEXNT-70Jan 2021; NO mabs therapy] s/p Vaccine;   Oncology History   No history exists.     HISTORY OF PRESENTING ILLNESS:  Alexandra Galloway89.o.  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  Patient is here to review the results of her antiphospholipid syndrome work-up/iron deficient anemia.  Patient denies any blood in stools or black or stools.  Patient declines any current hemoptysis.   Review of Systems  Constitutional: Positive for malaise/fatigue and weight loss. Negative for chills, diaphoresis and fever.  HENT: Negative for nosebleeds and sore throat.   Eyes: Negative for double vision.  Respiratory: Positive for shortness of breath. Negative for cough, hemoptysis, sputum production and wheezing.   Cardiovascular: Positive for chest pain. Negative for palpitations,  orthopnea and leg swelling.  Gastrointestinal: Positive for nausea. Negative for abdominal pain, blood in stool, constipation, diarrhea, heartburn, melena and vomiting.  Genitourinary: Negative for dysuria, frequency and urgency.  Musculoskeletal: Negative for back pain and joint pain.  Skin: Negative.  Negative for itching and rash.  Neurological: Positive for dizziness. Negative for tingling, focal weakness, weakness and headaches.       Transient loss of vision  Endo/Heme/Allergies: Does not bruise/bleed easily.  Psychiatric/Behavioral: Negative for depression. The patient is not nervous/anxious and does not have insomnia.      MEDICAL HISTORY:  Past Medical History:  Diagnosis Date  . COVID-19 virus infection   . Depression   . HTN (hypertension)     SURGICAL HISTORY: Past Surgical History:  Procedure Laterality Date  . WISDOM TOOTH EXTRACTION      SOCIAL HISTORY: Social History   Socioeconomic History  . Marital status: Married    Spouse name: Not on file  . Number of children: Not on file  . Years of education: Not on file  . Highest education level: Not on file  Occupational History  . Not on file  Tobacco Use  . Smoking status: Never Smoker  . Smokeless tobacco: Never Used  Substance and Sexual Activity  . Alcohol use: Yes    Alcohol/week: 6.0 standard drinks    Types: 2 Glasses of wine, 4 Cans of beer per week  . Drug use: No  . Sexual activity: Not on file  Other Topics Concern  . Not on file  Social History Narrative   No smoking; ocassional alcohol. 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    Social Determinants of Health   Financial Resource Strain: Not on file  Food Insecurity:  Not on file  Transportation Needs: Not on file  Physical Activity: Not on file  Stress: Not on file  Social Connections: Not on file  Intimate Partner Violence: Not on file    FAMILY HISTORY: Family History  Problem  Relation Age of Onset  . Allergies Mother   . Breast cancer Mother 58       DCIS  . High Cholesterol Father   . Hypertension Father   . Sleep apnea Father   . Diabetes Maternal Grandmother   . Hypertension Maternal Grandmother   . COPD Maternal Grandfather   . Thyroid disease Paternal Grandmother   . Hypertension Paternal Grandmother   . Hyperlipidemia Paternal Grandmother   . Sleep apnea Paternal Grandfather   . Hypertension Paternal Grandfather   . Hyperlipidemia Paternal Grandfather   . Hypertension Paternal Aunt   . Anxiety disorder Paternal Aunt   . Hypertension Maternal Uncle     ALLERGIES:  is allergic to kiwi extract, amoxicillin, and penicillins.  MEDICATIONS:  Current Outpatient Medications  Medication Sig Dispense Refill  . ELIQUIS 5 MG TABS tablet TAKE 2 TABLETS(10 MG) BY MOUTH TWICE DAILY FOR 7 DAYS THEN TAKE 1 TABLET(5 MG) BY MOUTH TWICE DAILY 180 tablet 1  . enoxaparin (LOVENOX) 100 MG/ML injection Inject 1 mL (100 mg total) into the skin every 12 (twelve) hours. 30 mL 0  . escitalopram (LEXAPRO) 20 MG tablet Take 1 tablet (20 mg total) by mouth daily. 90 tablet 1  . LORazepam (ATIVAN) 0.5 MG tablet Take 1 tablet (0.5 mg total) by mouth 2 (two) times daily as needed for anxiety. 60 tablet 0  . losartan (COZAAR) 50 MG tablet Take 1 tablet (50 mg total) by mouth at bedtime. 90 tablet 1  . losartan-hydrochlorothiazide (HYZAAR) 50-12.5 MG tablet Take 1 tablet by mouth daily. 90 tablet 1  . predniSONE (DELTASONE) 20 MG tablet Take 6 pills in AM [120 mg] daily. until further instruction. 100 tablet 0   No current facility-administered medications for this visit.      Marland Kitchen  PHYSICAL EXAMINATION:  Vitals:   04/14/20 1353  BP: 133/79  Pulse: (!) 110  Resp: 18  Temp: 98.5 F (36.9 C)  SpO2: 98%   Filed Weights   04/14/20 1353  Weight: 276 lb (125.2 kg)    Physical Exam Constitutional:      Comments: Patient walk independently. Accompanied by her husband. No  acute distress.  HENT:     Head: Normocephalic and atraumatic.     Mouth/Throat:     Pharynx: No oropharyngeal exudate.  Eyes:     Pupils: Pupils are equal, round, and reactive to light.  Cardiovascular:     Rate and Rhythm: Normal rate and regular rhythm.  Pulmonary:     Effort: Pulmonary effort is normal. No respiratory distress.     Breath sounds: Normal breath sounds. No wheezing.  Abdominal:     General: Bowel sounds are normal. There is no distension.     Palpations: Abdomen is soft. There is no mass.     Tenderness: There is no abdominal tenderness. There is no guarding or rebound.  Musculoskeletal:        General: No tenderness. Normal range of motion.     Cervical back: Normal range of motion and neck supple.  Skin:    General: Skin is warm.  Neurological:     Mental Status: She is alert and oriented to person, place, and time.  Psychiatric:  Mood and Affect: Affect normal.      LABORATORY DATA:  I have reviewed the data as listed Lab Results  Component Value Date   WBC 8.9 04/11/2020   HGB 7.8 (L) 04/11/2020   HCT 27.3 (L) 04/11/2020   MCV 68.4 (L) 04/11/2020   PLT 326 04/11/2020   Recent Labs    11/20/19 0457 11/21/19 0343 11/30/19 1155 01/11/20 1337 03/28/20 1629 04/11/20 1428  NA 135 138   < > 137 141 135  K 3.7 3.7   < > 4.3 3.8 3.7  CL 103 101   < > 101 104 100  CO2 24 25   < > '27 24 24  ' GLUCOSE 114* 114*   < > 109* 98 112*  BUN 8 8   < > 24* 16 15  CREATININE 0.76 0.67   < > 0.86 0.93 0.80  CALCIUM 7.9* 8.3*   < > 9.2 9.0 9.1  GFRNONAA >60 >60   < > >60 82 >60  GFRAA >60 >60  --   --  95  --   PROT  --   --    < > 7.2 6.9 7.4  ALBUMIN  --   --    < > 3.9 4.3 3.9  AST  --   --    < > '19 15 17  ' ALT  --   --    < > '19 13 16  ' ALKPHOS  --   --    < > 67 72 63  BILITOT  --   --    < > 0.5 <0.2 0.5   < > = values in this interval not displayed.    RADIOGRAPHIC STUDIES: I have personally reviewed the radiological images as listed and  agreed with the findings in the report. No results found.  ASSESSMENT & PLAN:   Antiphospholipid antibody syndrome (HCC) # Positive antiphospholipid antibodies [sep 2021; February 2022-repeat testing pending [done on eliquis]; beta-2 glycoprotein-38 elevated.   #I reviewed the myriad symptoms from antiphospholipid antibody syndrome-involving any organ in the body.  Patient unfortunately has both procoagulant [PE] and bleeding diathesis [diffuse alveolar hemorrhage].  Discussed the treatment options will mainly involve-anticoagulation; and also immunosuppressive therapy.   #Pulmonary embolism right lobar arteries- [sep 25th, 2021] I would recommend patient start Lovenox; discontinue Eliquis.  Recommend Lovenox 0.8 mg/kg subcu twice daily.  Prescription sent.  Patient will not take Eliquis this evening; consider start Lovenox.  Given Lovenox teaching.  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 ] hemoglobin 7.8; recommend p.o. iron.  Discussed re: Constipation; black colored stool.  Recommend IV iron infusion/defer to [facility].   #Also discussed currently immunosuppressive therapy-patient is currently on prednisone 1 mg/kg; also discussed potential other therapies include-Cytoxan/CellCept/rituximab.  Defer to [facility] hematology for further recommendations.  Patient has appointment with [facility] hematology on 1/23.   # DISPOSITION: # follow up TBD-Dr.B    All questions were answered. The patient knows to call the clinic with any problems, questions or concerns.    Cammie Sickle, MD 04/14/2020 2:59 PM

## 2020-04-14 NOTE — Telephone Encounter (Signed)
On 2/21-I called patient and spoke to her regarding my discussion with hematology team at [facility]; high clinical suspicion for antiphospholipid antibody syndrome.  Recommend follow-up today to discuss further.  C-please schedule MD; no labs- at 2: 00 pm today.  No need to call patient.  Patient will need Lovenox teaching.

## 2020-04-23 ENCOUNTER — Ambulatory Visit: Payer: BC Managed Care – PPO | Admitting: Physician Assistant

## 2020-04-23 ENCOUNTER — Other Ambulatory Visit: Payer: Self-pay

## 2020-04-23 ENCOUNTER — Encounter: Payer: Self-pay | Admitting: Physician Assistant

## 2020-04-23 VITALS — BP 142/92 | HR 96 | Temp 98.2°F | Wt 283.1 lb

## 2020-04-23 DIAGNOSIS — I10 Essential (primary) hypertension: Secondary | ICD-10-CM

## 2020-04-23 DIAGNOSIS — D6861 Antiphospholipid syndrome: Secondary | ICD-10-CM | POA: Diagnosis not present

## 2020-04-23 DIAGNOSIS — F419 Anxiety disorder, unspecified: Secondary | ICD-10-CM | POA: Diagnosis not present

## 2020-04-23 NOTE — Patient Instructions (Signed)
[email or web address]">  Generalized Anxiety Disorder, Adult Generalized anxiety disorder (GAD) is a mental health condition. Unlike normal worries, anxiety related to GAD is not triggered by a specific event. These worries do not fade or get better with time. GAD interferes with relationships, work, and school. GAD symptoms can vary from mild to severe. People with severe GAD can have intense waves of anxiety with physical symptoms that are similar to panic attacks. What are the causes? The exact cause of GAD is not known, but the following are believed to have an impact:  Differences in natural brain chemicals.  Genes passed down from parents to children.  Differences in the way threats are perceived.  Development during childhood.  Personality. What increases the risk? The following factors may make you more likely to develop this condition:  Being female.  Having a family history of anxiety disorders.  Being very shy.  Experiencing very stressful life events, such as the death of a loved one.  Having a very stressful family environment. What are the signs or symptoms? People with GAD often worry excessively about many things in their lives, such as their health and family. Symptoms may also include:  Mental and emotional symptoms: ? Worrying excessively about natural disasters. ? Fear of being late. ? Difficulty concentrating. ? Fears that others are judging your performance.  Physical symptoms: ? Fatigue. ? Headaches, muscle tension, muscle twitches, trembling, or feeling shaky. ? Feeling like your heart is pounding or beating very fast. ? Feeling out of breath or like you cannot take a deep breath. ? Having trouble falling asleep or staying asleep, or experiencing restlessness. ? Sweating. ? Nausea, diarrhea, or irritable bowel syndrome (IBS).  Behavioral symptoms: ? Experiencing erratic moods or irritability. ? Avoidance of new situations. ? Avoidance of  people. ? Extreme difficulty making decisions. How is this diagnosed? This condition is diagnosed based on your symptoms and medical history. You will also have a physical exam. Your health care provider may perform tests to rule out other possible causes of your symptoms. To be diagnosed with GAD, a person must have anxiety that:  Is out of his or her control.  Affects several different aspects of his or her life, such as work and relationships.  Causes distress that makes him or her unable to take part in normal activities.  Includes at least three symptoms of GAD, such as restlessness, fatigue, trouble concentrating, irritability, muscle tension, or sleep problems. Before your health care provider can confirm a diagnosis of GAD, these symptoms must be present more days than they are not, and they must last for 6 months or longer. How is this treated? This condition may be treated with:  Medicine. Antidepressant medicine is usually prescribed for long-term daily control. Anti-anxiety medicines may be added in severe cases, especially when panic attacks occur.  Talk therapy (psychotherapy). Certain types of talk therapy can be helpful in treating GAD by providing support, education, and guidance. Options include: ? Cognitive behavioral therapy (CBT). People learn coping skills and self-calming techniques to ease their physical symptoms. They learn to identify unrealistic thoughts and behaviors and to replace them with more appropriate thoughts and behaviors. ? Acceptance and commitment therapy (ACT). This treatment teaches people how to be mindful as a way to cope with unwanted thoughts and feelings. ? Biofeedback. This process trains you to manage your body's response (physiological response) through breathing techniques and relaxation methods. You will work with a therapist while machines are used to monitor your physical   symptoms.  Stress management techniques. These include yoga,  meditation, and exercise. A mental health specialist can help determine which treatment is best for you. Some people see improvement with one type of therapy. However, other people require a combination of therapies.   Follow these instructions at home: Lifestyle  Maintain a consistent routine and schedule.  Anticipate stressful situations. Create a plan, and allow extra time to work with your plan.  Practice stress management or self-calming techniques that you have learned from your therapist or your health care provider. General instructions  Take over-the-counter and prescription medicines only as told by your health care provider.  Understand that you are likely to have setbacks. Accept this and be kind to yourself as you persist to take better care of yourself.  Recognize and accept your accomplishments, even if you judge them as small.  Keep all follow-up visits as told by your health care provider. This is important. Contact a health care provider if:  Your symptoms do not get better.  Your symptoms get worse.  You have signs of depression, such as: ? A persistently sad or irritable mood. ? Loss of enjoyment in activities that used to bring you joy. ? Change in weight or eating. ? Changes in sleeping habits. ? Avoiding friends or family members. ? Loss of energy for normal tasks. ? Feelings of guilt or worthlessness. Get help right away if:  You have serious thoughts about hurting yourself or others. If you ever feel like you may hurt yourself or others, or have thoughts about taking your own life, get help right away. Go to your nearest emergency department or:  Call your local emergency services (911 in the [place]  Call a suicide crisis helpline, such as the National Suicide Prevention Lifeline at 1-800-273-8255. This is open 24 hours a day in the [place]  Text the Crisis Text Line at 741741 (in the [place] Summary  Generalized anxiety disorder (GAD) is a mental  health condition that involves worry that is not triggered by a specific event.  People with GAD often worry excessively about many things in their lives, such as their health and family.  GAD may cause symptoms such as restlessness, trouble concentrating, sleep problems, frequent sweating, nausea, diarrhea, headaches, and trembling or muscle twitching.  A mental health specialist can help determine which treatment is best for you. Some people see improvement with one type of therapy. However, other people require a combination of therapies. This information is not intended to replace advice given to you by your health care provider. Make sure you discuss any questions you have with your health care provider. Document Revised: 11/29/2018 Document Reviewed: 11/29/2018 Elsevier Patient Education  2021 Elsevier Inc.  

## 2020-04-23 NOTE — Progress Notes (Signed)
Established patient visit   Patient: Alexandra Fleming   DOB: June 10, 1990   30 y.o. Female  MRN: [id number] Visit Date: 04/23/2020  Today's healthcare provider: Trey Sailors, PA-C   Chief Complaint  Patient presents with  . Anxiety  I,Marchia Diguglielmo M Maxxwell Edgett,acting as a scribe for Trey Sailors, PA-C.,have documented all relevant documentation on the behalf of Trey Sailors, PA-C,as directed by  Trey Sailors, PA-C while in the presence of Trey Sailors, PA-C.  Subjective    HPI   In the interim, she has been diagnosed with antiphospholipid syndrome. She has been referred to [facility] hematology. She is currently on high dose prednisone. She is set to receive an iron infusion at [facility] on 04/25/2020. She has also been transitioned from eliquis to Lovenox. [facility], overall impression was antiphospholipid syndrome has caused diffuse alveolar hemorrhage.   Anxiety, Follow-up  She was last seen for anxiety 1 months ago. Changes made at last visit include started Lorazepam (ATIVAN) 0.5 MG tablet; Take 1 tablet (0.5 mg total) by mouth 2 (two) times daily as needed for anxiety. Continues with Lexapro. Called for appointment <24 hours    She reports good compliance with treatment. She reports good tolerance of treatment. She is not having side effects.   She feels her anxiety is moderate and Improved since last visit.  Symptoms: No chest pain Yes difficulty concentrating  No dizziness Yes fatigue  No feelings of losing control No insomnia  Yes irritable No palpitations  No panic attacks Yes racing thoughts  No shortness of breath No sweating  No tremors/shakes    GAD-7 Results GAD-7 Generalized Anxiety Disorder Screening Tool 04/23/2020 07/19/2019 03/12/2019  1. Feeling Nervous, Anxious, or on Edge 2 3 3   2. Not Being Able to Stop or Control Worrying 3 3 3   3. Worrying Too Much About Different Things 2 3 3   4. Trouble Relaxing 3 3 3   5.  Being So Restless it's Hard To Sit Still 1 3 3   6. Becoming Easily Annoyed or Irritable 1 3 2   7. Feeling Afraid As If Something Awful Might Happen 2 3 3   Total GAD-7 Score 14 21 20   Difficulty At Work, Home, or Getting  Along With Others? Very difficult Very difficult Very difficult    PHQ-9 Scores PHQ9 SCORE ONLY 03/28/2020 12/27/2019 07/19/2019  PHQ-9 Total Score 27 22 18     ---------------------------------------------------------------------------------------------------      Medications: Outpatient Medications Prior to Visit  Medication Sig  . enoxaparin (LOVENOX) 100 MG/ML injection Inject 1 mL (100 mg total) into the skin every 12 (twelve) hours.  escitalopram (LEXAPRO) 20 MG tablet Take 1 tablet (20 mg total) by mouth daily.  losartan (COZAAR) 50 MG tablet Take 1 tablet (50 mg total) by mouth at bedtime.  losartan-hydrochlorothiazide (HYZAAR) 50-12.5 MG tablet Take 1 tablet by mouth daily.  . predniSONE (DELTASONE) 20 MG tablet Take 6 pills in AM [120 mg] daily. until further instruction.  . [DISCONTINUED] LORazepam (ATIVAN) 0.5 MG tablet Take 1 tablet (0.5 mg total) by mouth 2 (two) times daily as needed for anxiety.  . [DISCONTINUED] ELIQUIS 5 MG TABS tablet TAKE 2 TABLETS(10 MG) BY MOUTH TWICE DAILY FOR 7 DAYS THEN TAKE 1 TABLET(5 MG) BY MOUTH TWICE DAILY   No facility-administered medications prior to visit.    Review of Systems  Constitutional: Negative.   Respiratory: Negative.   Psychiatric/Behavioral: Negative.  Objective    BP (!) 142/92 (BP Location: Left Arm, Patient Position: Sitting, Cuff Size: Large)   Pulse 96   Temp 98.2 F (36.8 C) (Oral)   Wt 283 lb 1.6 oz (128.4 kg)   SpO2 99%   BMI 45.69 kg/m     Physical Exam Constitutional:      Appearance: Normal appearance.  Cardiovascular:     Rate and Rhythm: Normal rate and regular rhythm.     Heart sounds: Normal heart sounds.  Pulmonary:     Effort: Pulmonary effort is normal.      Breath sounds: Normal breath sounds.  Skin:    General: Skin is warm and dry.  Neurological:     General: No focal deficit present.     Mental Status: She is alert and oriented to person, place, and time.  Psychiatric:        Mood and Affect: Mood normal.        Behavior: Behavior normal.       No results found for any visits on 04/23/20.  Assessment & Plan    1. Antiphospholipid antibody syndrome (HCC)  Continue follow up with [facility] hematology. She is also seeing rheumatology as well for input on steroids.   2. Anxiety  Continue lexapro and ativan. May transition to longer term medication like buspar once she sees psychiatry.   3. Primary hypertension  Continue medications.    Return in about 6 months (around 10/24/2020) for anxiety .      ITrey Sailors, PA-C, have reviewed all documentation for this visit. The documentation on 04/24/20 for the exam, diagnosis, procedures, and orders are all accurate and complete.  The entirety of the information documented in the History of Present Illness, Review of Systems and Physical Exam were personally obtained by me. Portions of this information were initially documented by [facility] and reviewed by me for thoroughness and accuracy.     Maryella Shivers  [facility] [phone] (phone) [phone] (fax)  [facility]

## 2020-04-24 ENCOUNTER — Other Ambulatory Visit: Payer: Self-pay | Admitting: Physician Assistant

## 2020-04-24 DIAGNOSIS — F419 Anxiety disorder, unspecified: Secondary | ICD-10-CM

## 2020-04-24 MED ORDER — LORAZEPAM 0.5 MG PO TABS: 0.5000 mg | ORAL_TABLET | Freq: Two times a day (BID) | ORAL | 5 refills | Status: AC | PRN

## 2020-04-24 NOTE — Telephone Encounter (Signed)
Requested medication (s) are due for refill today: Yes  Requested medication (s) are on the active medication list: Yes  Last refill:  04/17/20  Future visit scheduled: Yes  Notes to clinic:  Unable to refill per protocol, cannot delegate.      Requested Prescriptions  Pending Prescriptions Disp Refills   LORazepam (ATIVAN) 0.5 MG tablet [Pharmacy Med Name: LORAZEPAM 0.5MG  TABLETS] 60 tablet     Sig: TAKE 1 TABLET(0.5 MG) BY MOUTH TWICE DAILY AS NEEDED FOR ANXIETY      Not Delegated - Psychiatry:  Anxiolytics/Hypnotics Failed - 04/24/2020  9:55 AM      Failed - This refill cannot be delegated      Failed - Urine Drug Screen completed in last 360 days      Passed - Valid encounter within last 6 months    Recent Outpatient Visits           Yesterday Antiphospholipid antibody syndrome [facility])   [facility] [place], Ricki Rodriguez M, PA-C   3 weeks ago Severe obstructive sleep apnea   [facility] Osvaldo Angst M, [place]   3 months ago Annual physical exam   [facility] Osvaldo Angst M, [place]   3 months ago Community acquired pneumonia, bilateral   [facility] [place], [place], PA-C   4 months ago Anemia, unspecified type   [facility] [place], Lavella Hammock, PA-C       Future Appointments             In 2 months Trey Sailors, PA-C [facility]   In 6 months Bacigalupo, Marzella Schlein, MD [facility]

## 2020-06-23 ENCOUNTER — Telehealth: Payer: Self-pay

## 2020-06-23 DIAGNOSIS — I2699 Other pulmonary embolism without acute cor pulmonale: Secondary | ICD-10-CM

## 2020-06-23 NOTE — Telephone Encounter (Signed)
It looks like the furosemide was written by Ricki Rodriguez in February to get fluid off of her lungs. It's not meant to be a long term prescription. She has an appointment with her pulmonologist on Wednesday and should talk to him about whether or not she should continue taking it.

## 2020-06-23 NOTE — Telephone Encounter (Signed)
[facility] faxed refill request for the following medications:   furosemide (LASIX) 20 MG tablet   Please advise.  

## 2020-06-23 NOTE — Telephone Encounter (Signed)
Please review. This medication is not listed in patient's active medication list. Furosemide was discontinued on 04/11/2020 during oncology office visit. Is patient supposed to be on this medication? Please advise.

## 2020-06-24 NOTE — Telephone Encounter (Signed)
[name], P[facility] Triage Nurse may give patient results  

## 2020-06-25 NOTE — Telephone Encounter (Signed)
Attempted to call patient with PCP response- left message on VM to call office

## 2020-07-22 ENCOUNTER — Other Ambulatory Visit: Payer: Self-pay

## 2020-07-22 ENCOUNTER — Ambulatory Visit: Payer: Self-pay | Admitting: Physician Assistant

## 2020-07-22 DIAGNOSIS — I10 Essential (primary) hypertension: Secondary | ICD-10-CM

## 2020-07-22 DIAGNOSIS — F419 Anxiety disorder, unspecified: Secondary | ICD-10-CM

## 2020-07-22 MED ORDER — ESCITALOPRAM OXALATE 20 MG PO TABS: 20.0000 mg | ORAL_TABLET | Freq: Every day | ORAL | 1 refills | Status: DC

## 2020-07-22 MED ORDER — LOSARTAN POTASSIUM 50 MG PO TABS: 50.0000 mg | ORAL_TABLET | Freq: Every day | ORAL | 1 refills | Status: AC

## 2020-07-22 NOTE — Telephone Encounter (Addendum)
[facility] faxed refill request for the following medications:  losartan (COZAAR) 50 MG tablet  escitalopram (LEXAPRO) 20 MG tablet   Please advise.

## 2020-07-28 ENCOUNTER — Encounter: Payer: Self-pay | Admitting: Family Medicine

## 2020-08-11 NOTE — Telephone Encounter (Signed)
Noted conversation with CMA Michelle Nasuti

## 2020-09-22 ENCOUNTER — Telehealth: Payer: Self-pay | Admitting: Family Medicine

## 2020-09-22 DIAGNOSIS — I10 Essential (primary) hypertension: Secondary | ICD-10-CM

## 2020-09-22 NOTE — Telephone Encounter (Signed)
[facility] faxed refill request for the following medications: ° °losartan-hydrochlorothiazide (HYZAAR) 50-12.5 MG tablet  ° °Please advise. ° °

## 2020-09-24 NOTE — Telephone Encounter (Signed)
Please review. Patient has losartan and losartan/HCTZ listed on her med list. After reviewing chart, I cant tell which one she is supposed to be on. 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 You refilled Losartan in 06/2020, and Adriana refilled Losartan/HCTZ in 12/2019. I cant tell when it was changed or why.

## 2020-09-25 NOTE — Telephone Encounter (Signed)
She may be taking both to make 100mg  of losartan.  We need to check with her before refilling though

## 2020-09-25 NOTE — Telephone Encounter (Signed)
[name] 09/25/2020.  PEC please verify if pt is taking both losartan and losartan/hctz  Thanks,   -Vernona Rieger

## 2020-10-01 NOTE — Telephone Encounter (Signed)
Left message for patient to call back to clarify which prescription she needs refilled. On active med list there is listed Losartan and Losartan/HCTZ, if [facility] triage nurse would speak with patient to clarify. KW

## 2020-10-01 NOTE — Telephone Encounter (Signed)
Patient called, left VM to return the call to the office for clarification on Losartan.

## 2020-10-02 NOTE — Telephone Encounter (Signed)
lmtcb

## 2020-10-02 NOTE — Telephone Encounter (Signed)
Patient called, left VM to return the call to the office for clarification on Losartan refill request.

## 2020-10-24 ENCOUNTER — Ambulatory Visit: Payer: Self-pay | Admitting: Family Medicine

## 2021-02-02 ENCOUNTER — Other Ambulatory Visit: Payer: Self-pay

## 2021-02-02 ENCOUNTER — Telehealth: Payer: Self-pay | Admitting: Family Medicine

## 2021-02-02 DIAGNOSIS — F419 Anxiety disorder, unspecified: Secondary | ICD-10-CM

## 2021-02-02 MED ORDER — ESCITALOPRAM OXALATE 20 MG PO TABS: 20.0000 mg | ORAL_TABLET | Freq: Every day | ORAL | 0 refills | Status: AC

## 2021-02-02 NOTE — Telephone Encounter (Signed)
[facility] faxed refill request for the following medications:   escitalopram (LEXAPRO) 20 MG tablet   Please advise.  

## 2021-02-12 IMAGING — CT CT ANGIO CHEST
2 of 7 series · 13 of 36 positions shown · IV contrast (omnipaque)
Comparison: 11/17/2019

CLINICAL DATA: Chest pain.  Shortness of breath.

EXAM:
CT ANGIOGRAPHY CHEST WITH CONTRAST
TECHNIQUE: Multidetector CT imaging of the chest was performed using the
standard protocol during bolus administration of intravenous
contrast. Multiplanar CT image reconstructions and MIPs were
obtained to evaluate the vascular anatomy.
CONTRAST:  75mL OMNIPAQUE IOHEXOL 350 MG/ML SOLN

[Series 5: thins cta pulmonary 1.00 · axial · 0.65mm/px · z∈[-1172,-926]mm · 12 of 292 slices shown]
[im 23/292  lung]
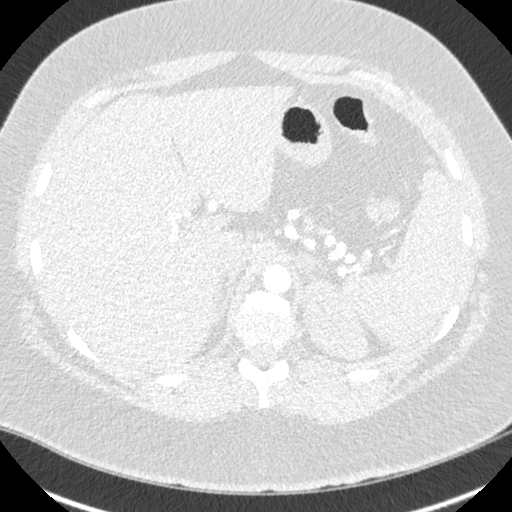
[im 45/292  mediastinal]
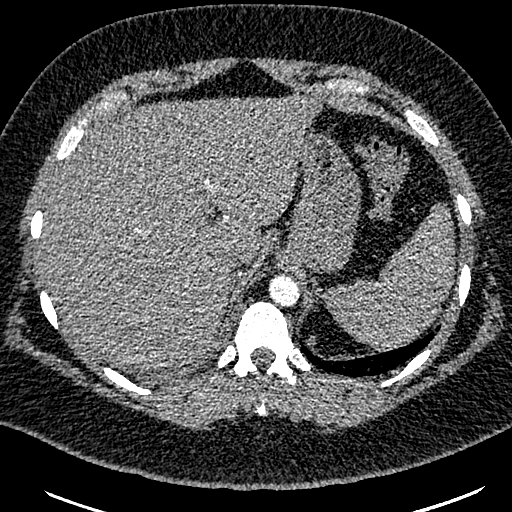
[im 68/292  lung]
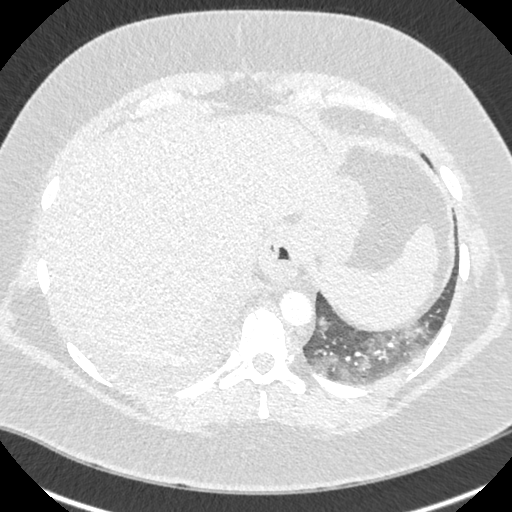
[im 90/292  mediastinal]
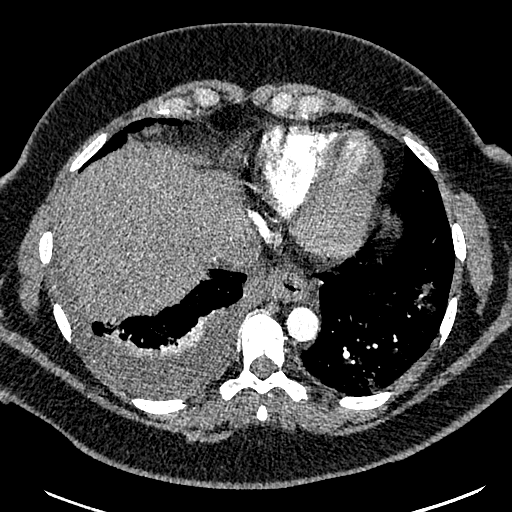
[im 112/292  lung]
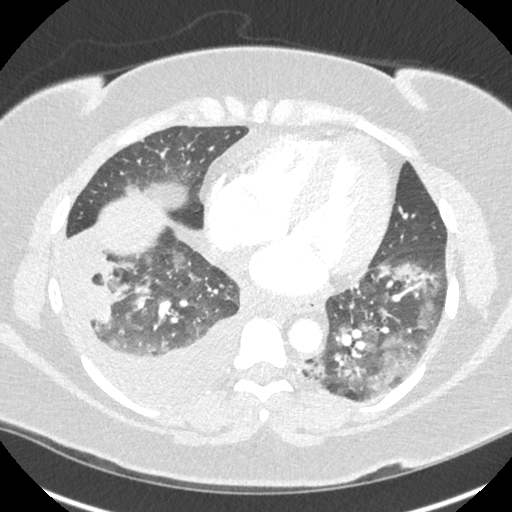
[im 135/292  mediastinal]
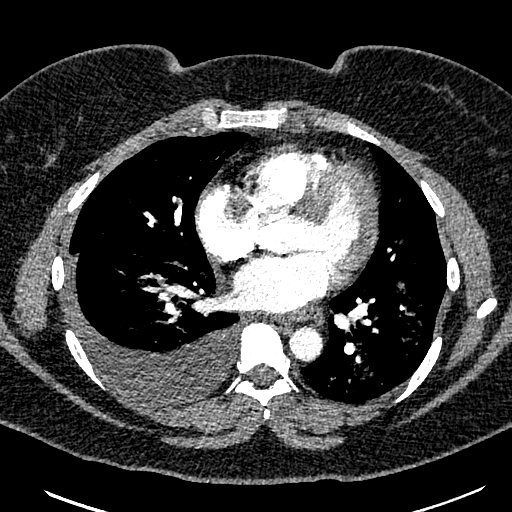
[im 157/292  lung]
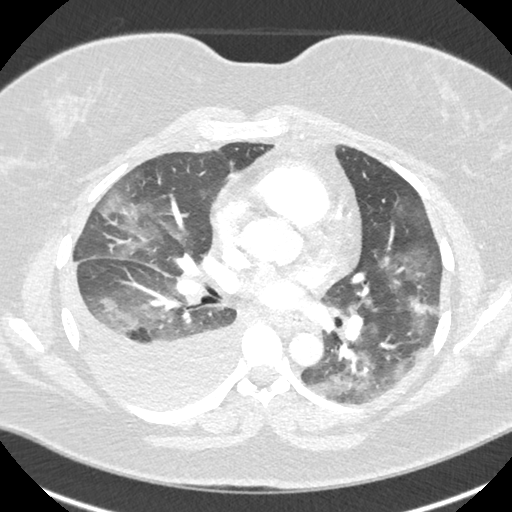
[im 180/292  mediastinal]
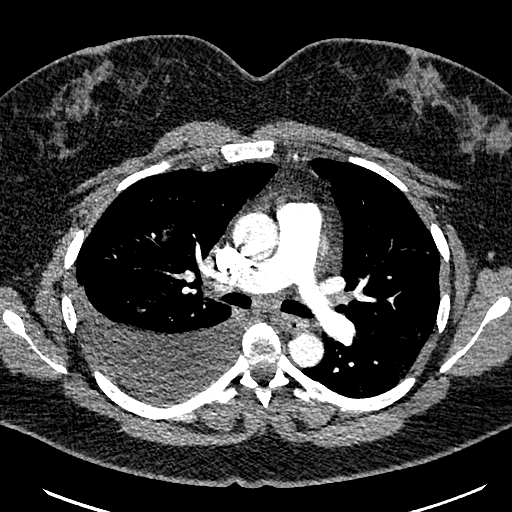
[im 202/292  lung]
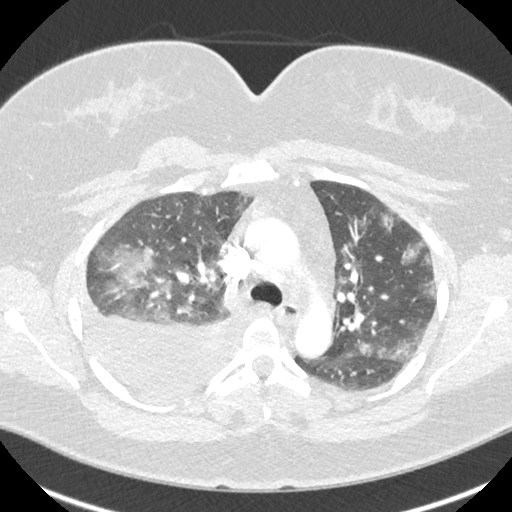
[im 224/292  mediastinal]
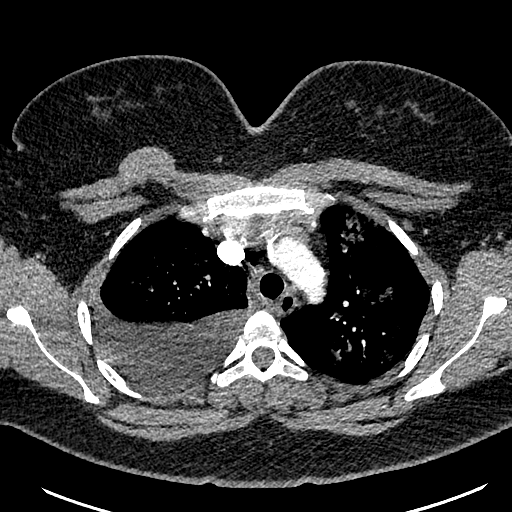
[im 247/292  lung]
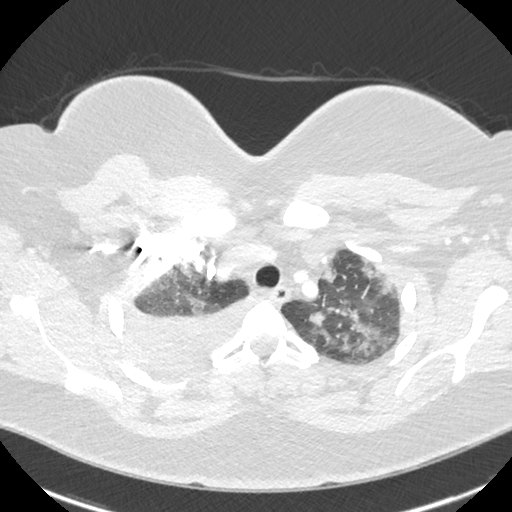
[im 269/292  mediastinal]
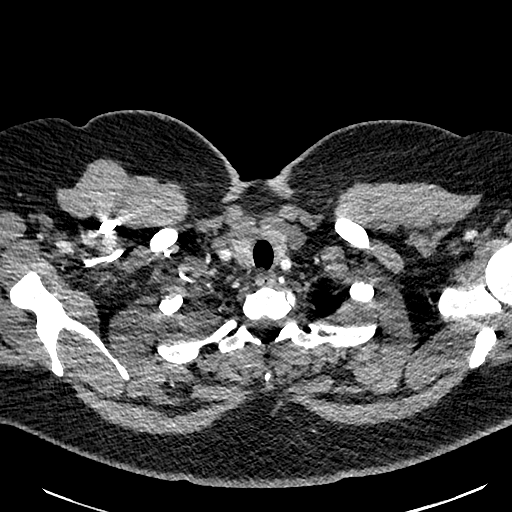

[Series 7: cta pulmonary 2.00 cor · coronal · 0.57mm/px · 1 of 139 slices shown]
[im 70/139  mediastinal]
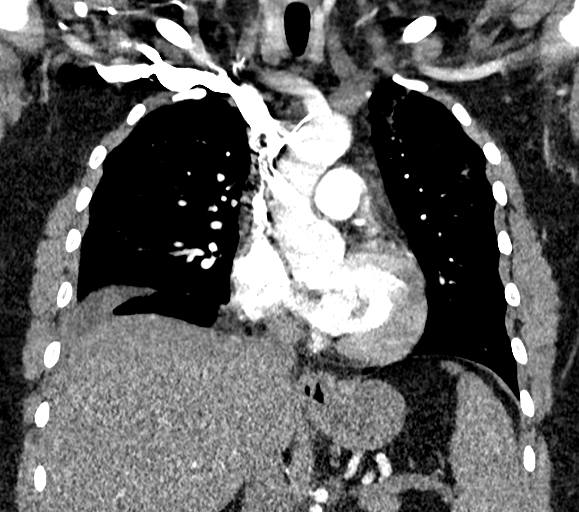

[13 of 36 positions shown; findings below may reference images not displayed]

FINDINGS: Cardiovascular: The heart size is normal. No substantial pericardial
effusion. No thoracic aortic aneurysm. No large central pulmonary
embolus. Trace pulmonary embolus identified in subsegmental
pulmonary arteries to the left lower lobe (image 129/series 5 and
image 190/series 5). No evidence for acute pulmonary embolus in the
right lung.

Mediastinum/Nodes: No mediastinal lymphadenopathy. There is no hilar
lymphadenopathy. The esophagus has normal imaging features. There is
no axillary lymphadenopathy.

Lungs/Pleura: There is diffuse patchy and nodular airspace disease
involving all lobes of both lungs. Areas of collapse/consolidation
are noted in the right lower lobe and there is a small to moderate
right pleural effusion.

Upper Abdomen: Unremarkable.

Musculoskeletal: No worrisome lytic or sclerotic osseous
abnormality.

Review of the MIP images confirms the above findings.
IMPRESSION: 1. Trace pulmonary embolus identified in subsegmental pulmonary
arteries to the left lower lobe. No evidence for acute pulmonary
embolus in the right lung.
2. Diffuse patchy and nodular airspace disease involving all lobes
of both lungs. This is substantially progressed since 11/17/2019 as
the patient had no upper lobe or right middle lobe involvement on
the prior exam. Areas of collapse/consolidation are noted in the
right lower lobe and there is a small to moderate right pleural
effusion. Imaging features most likely secondary to
infectious/inflammatory etiology. Atypical or viral pneumonia could
have this appearance.

Critical Value/emergent results were called by me at the time of
interpretation on 12/19/2019 at [DATE] to provider SANGEETA LATINO
, who verbally acknowledged these results.

## 2021-02-13 IMAGING — DX DG CHEST 1V PORT
1 series · 1 of 1 positions shown · non-contrast
Comparison: CTA of the chest on 12/19/2019 and chest x-ray on
11/18/2019.

CLINICAL DATA: Status post right thoracentesis.

EXAM:
PORTABLE CHEST 1 VIEW

[chest ap]
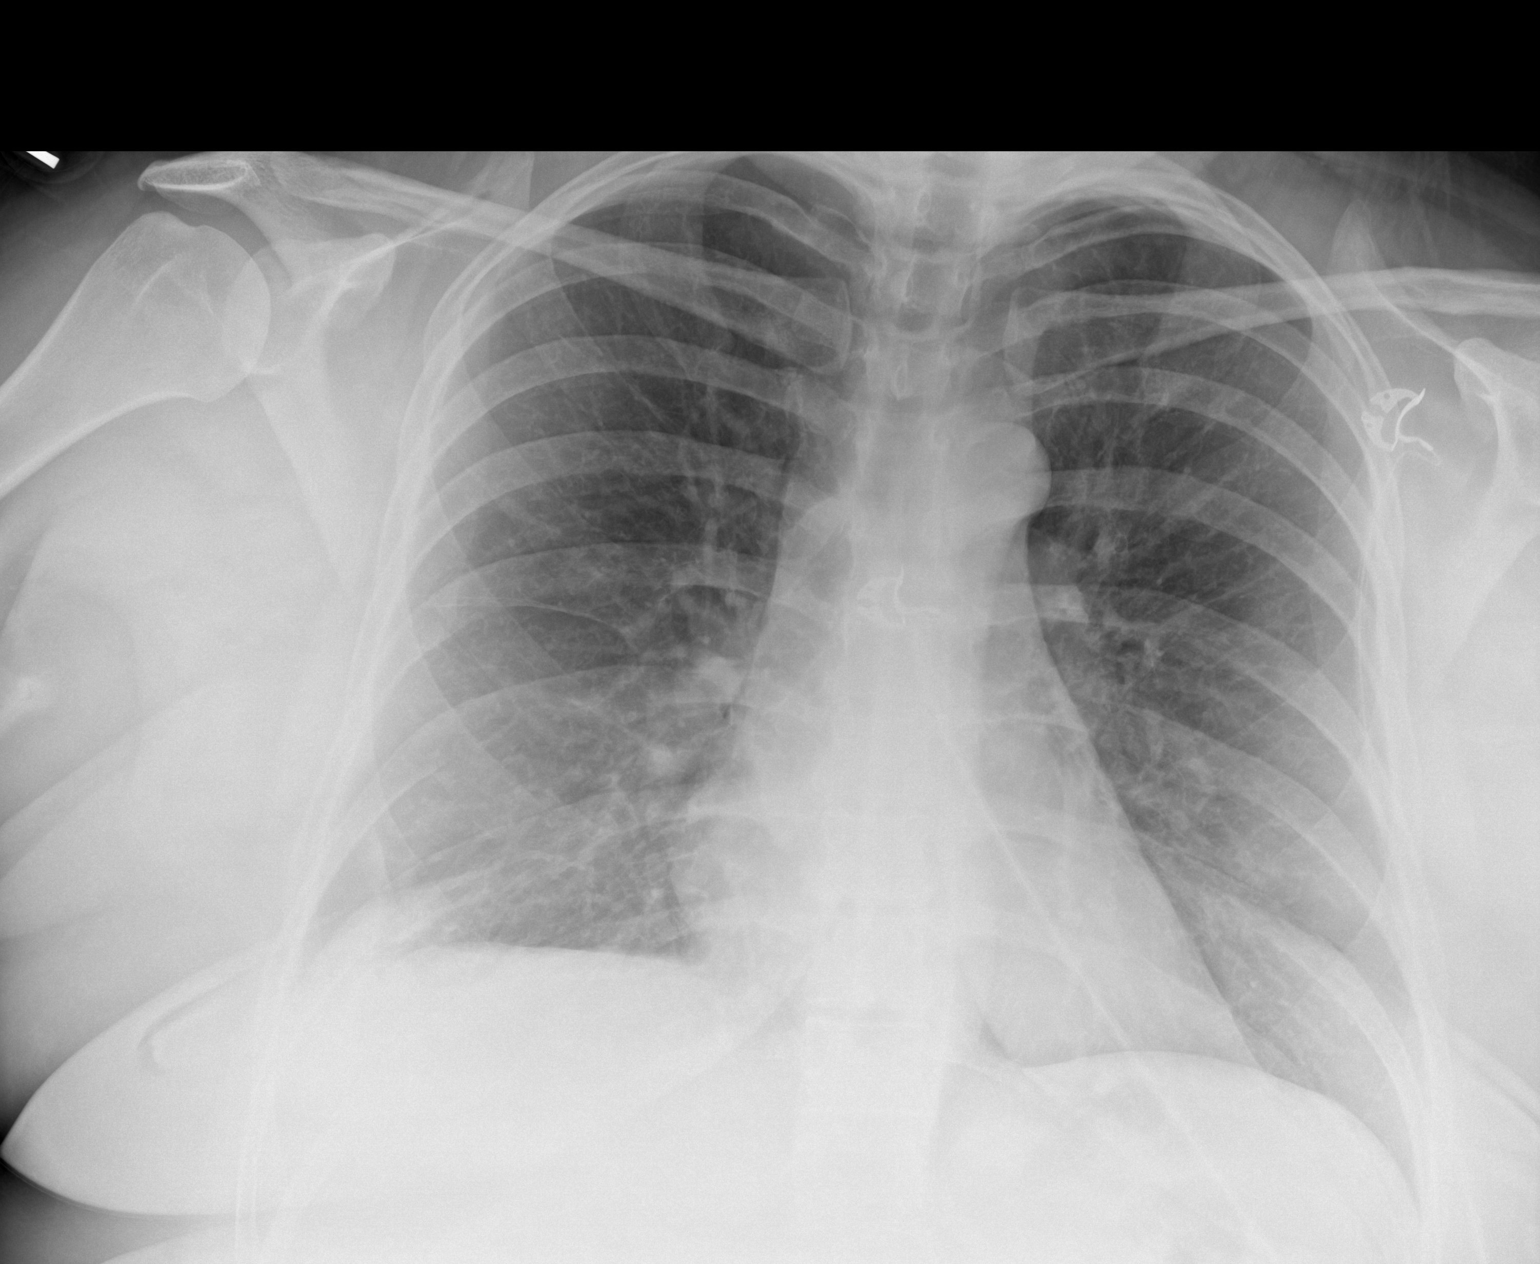

[1 of 1 positions shown; findings below may reference images not displayed]

FINDINGS: The heart size and mediastinal contours are within normal limits. No
pneumothorax after thoracentesis. No significant pleural fluid
identified. Mild right basilar atelectasis. Patchy bilateral
interstitial infiltrates. The visualized skeletal structures are
unremarkable.
IMPRESSION: No pneumothorax after right-sided thoracentesis. Patchy bilateral
interstitial infiltrates.

## 2021-02-14 IMAGING — DX DG CHEST DECUBITUS*R*
2 series · 2 of 2 positions shown · non-contrast
Comparison: PA chest x-ray 12/20/2019.

CLINICAL DATA: Pleural effusion.

EXAM:
CHEST - RIGHT DECUBITUS

[chest decu (1 of 2)]
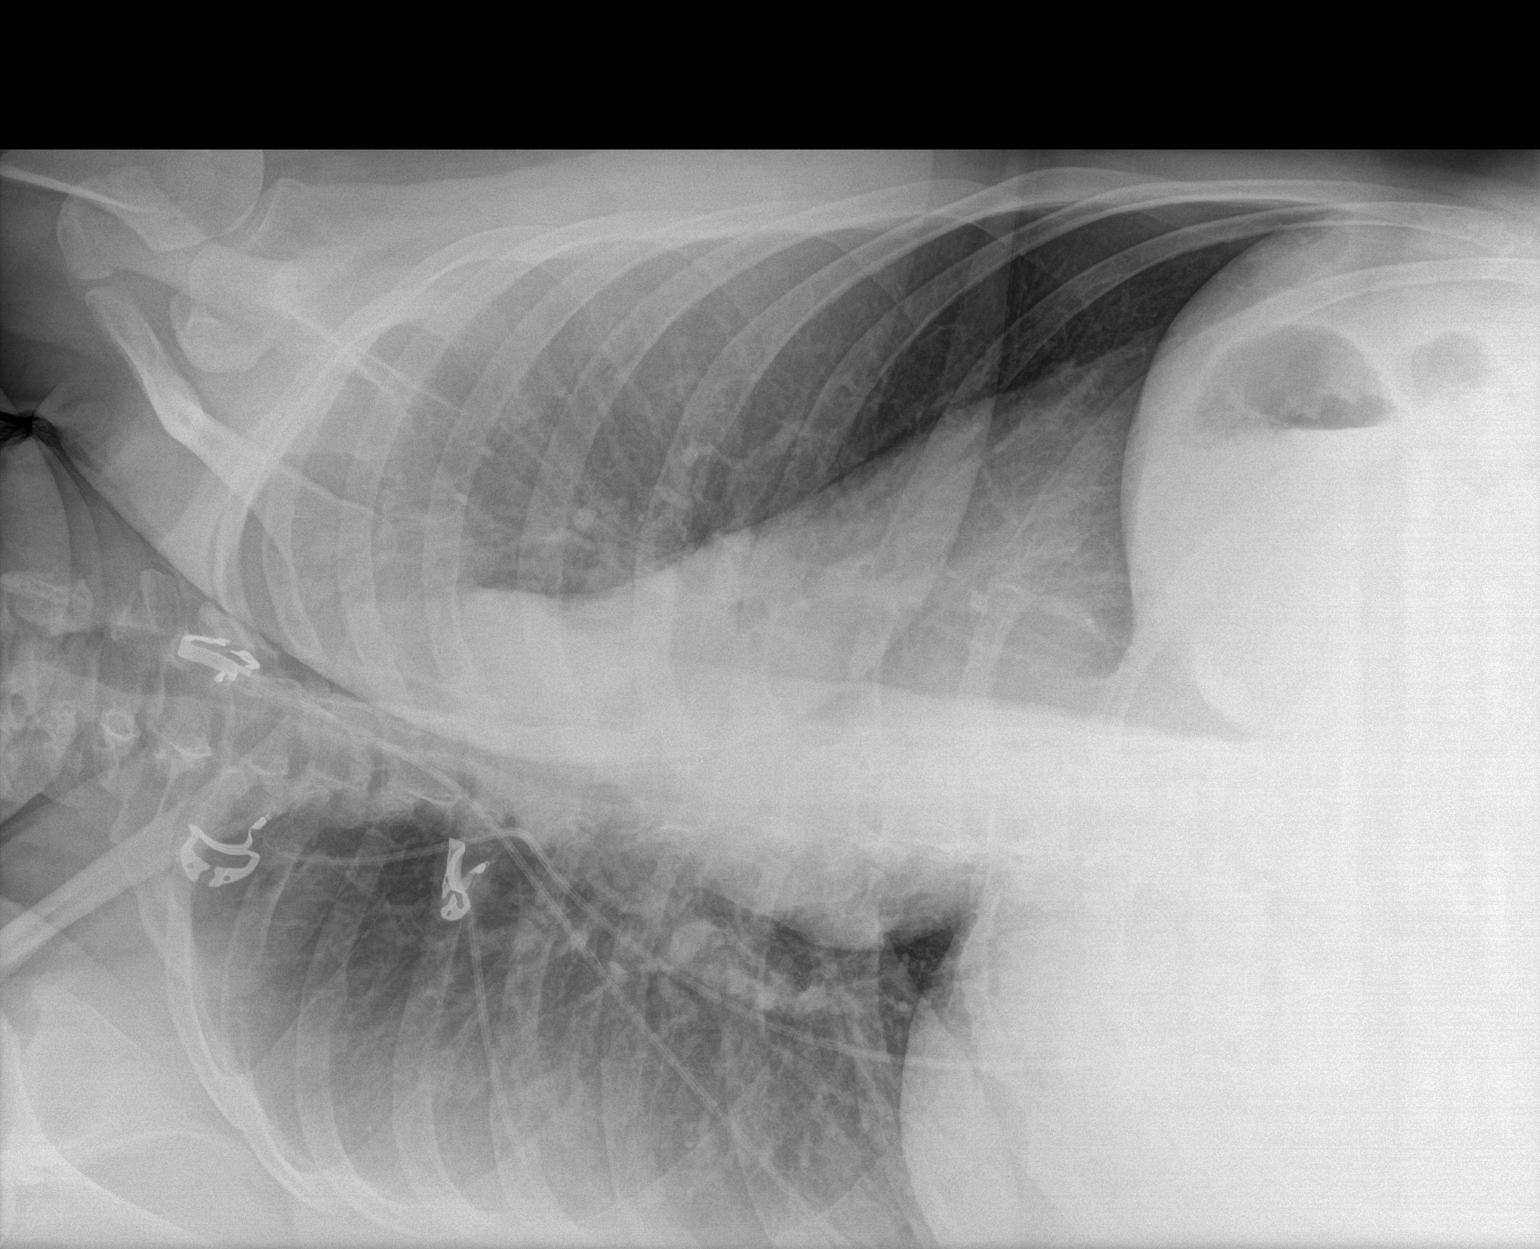

[chest decu (2 of 2)]
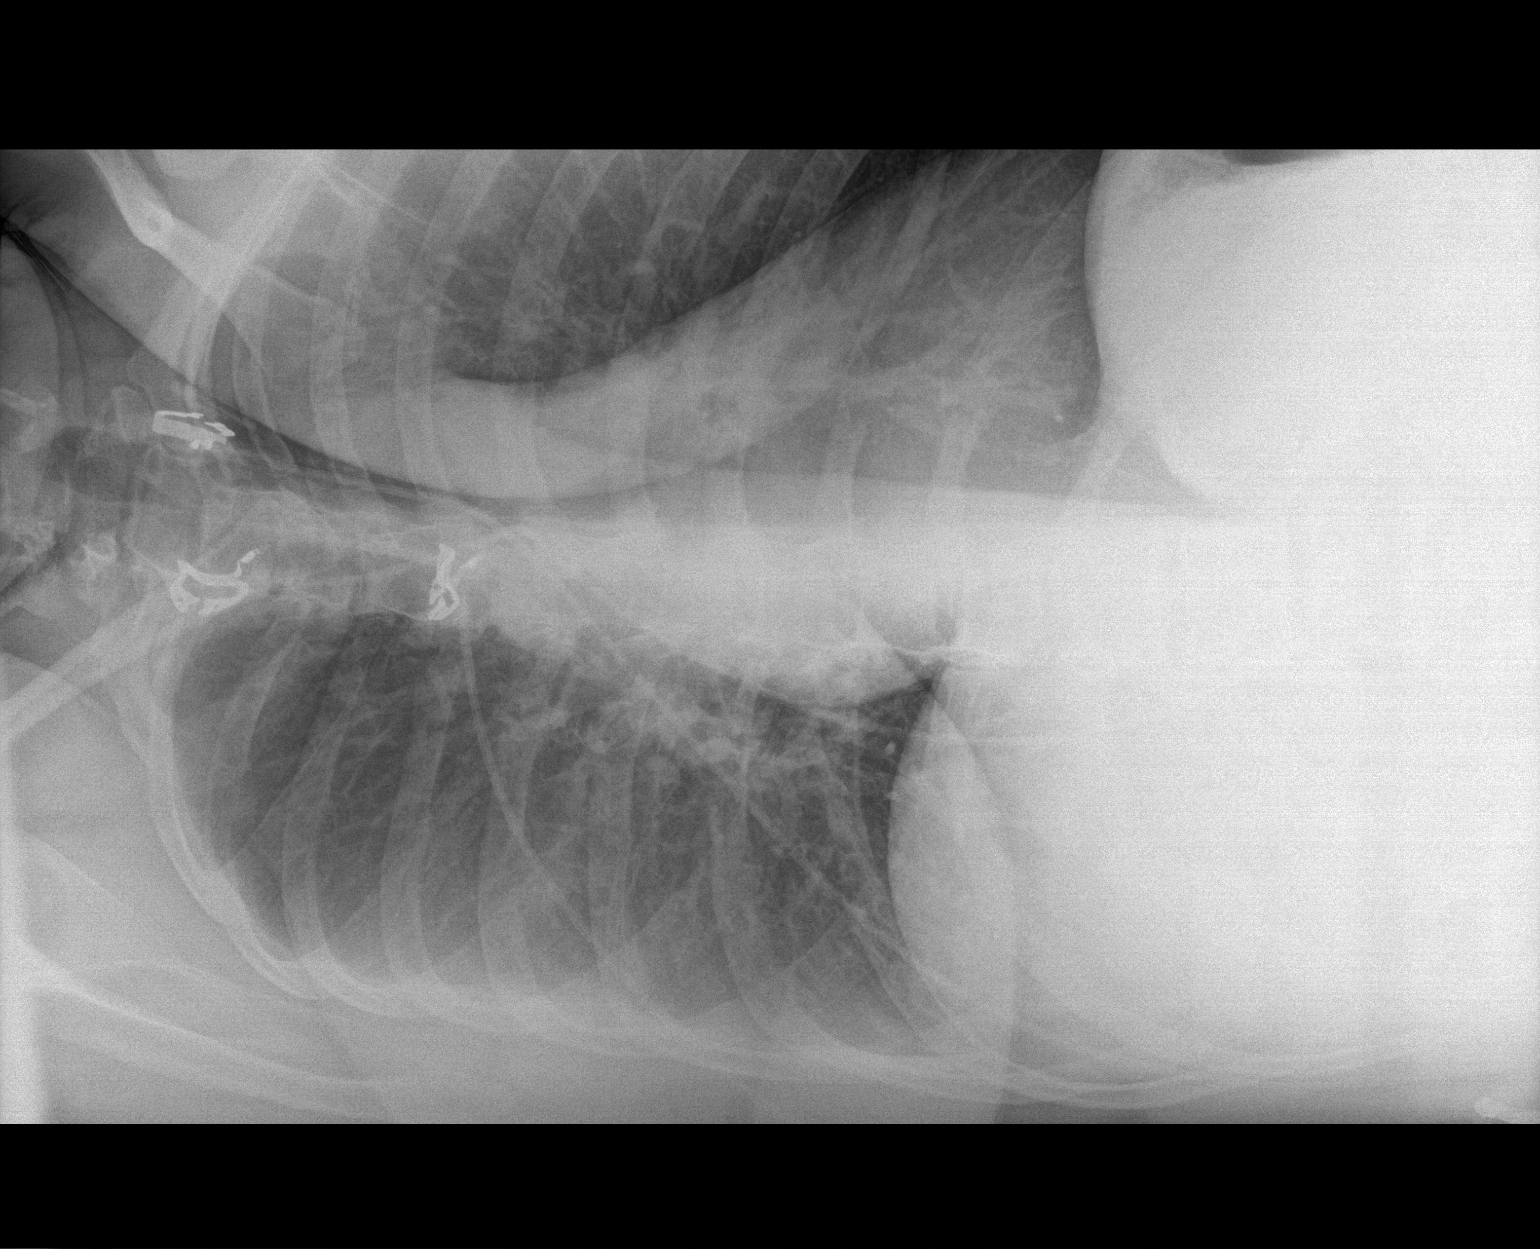

[2 of 2 positions shown; findings below may reference images not displayed]

FINDINGS: Right-side-down decubitus reveals minimal pleural effusion. No
prominent pleural effusion identified. No pneumothorax. No evidence
of loculated fluid collection.
IMPRESSION: Minimal right pleural effusion.

## 2021-02-15 IMAGING — DX DG CHEST 1V PORT
1 series · 1 of 1 positions shown · non-contrast
Comparison: 12/21/2019, 12/20/2019 and earlier studies.

CLINICAL DATA: Follow-up for pleural effusion. History of ORUED-PC
infection.

EXAM:
PORTABLE CHEST 1 VIEW

[chest ap]
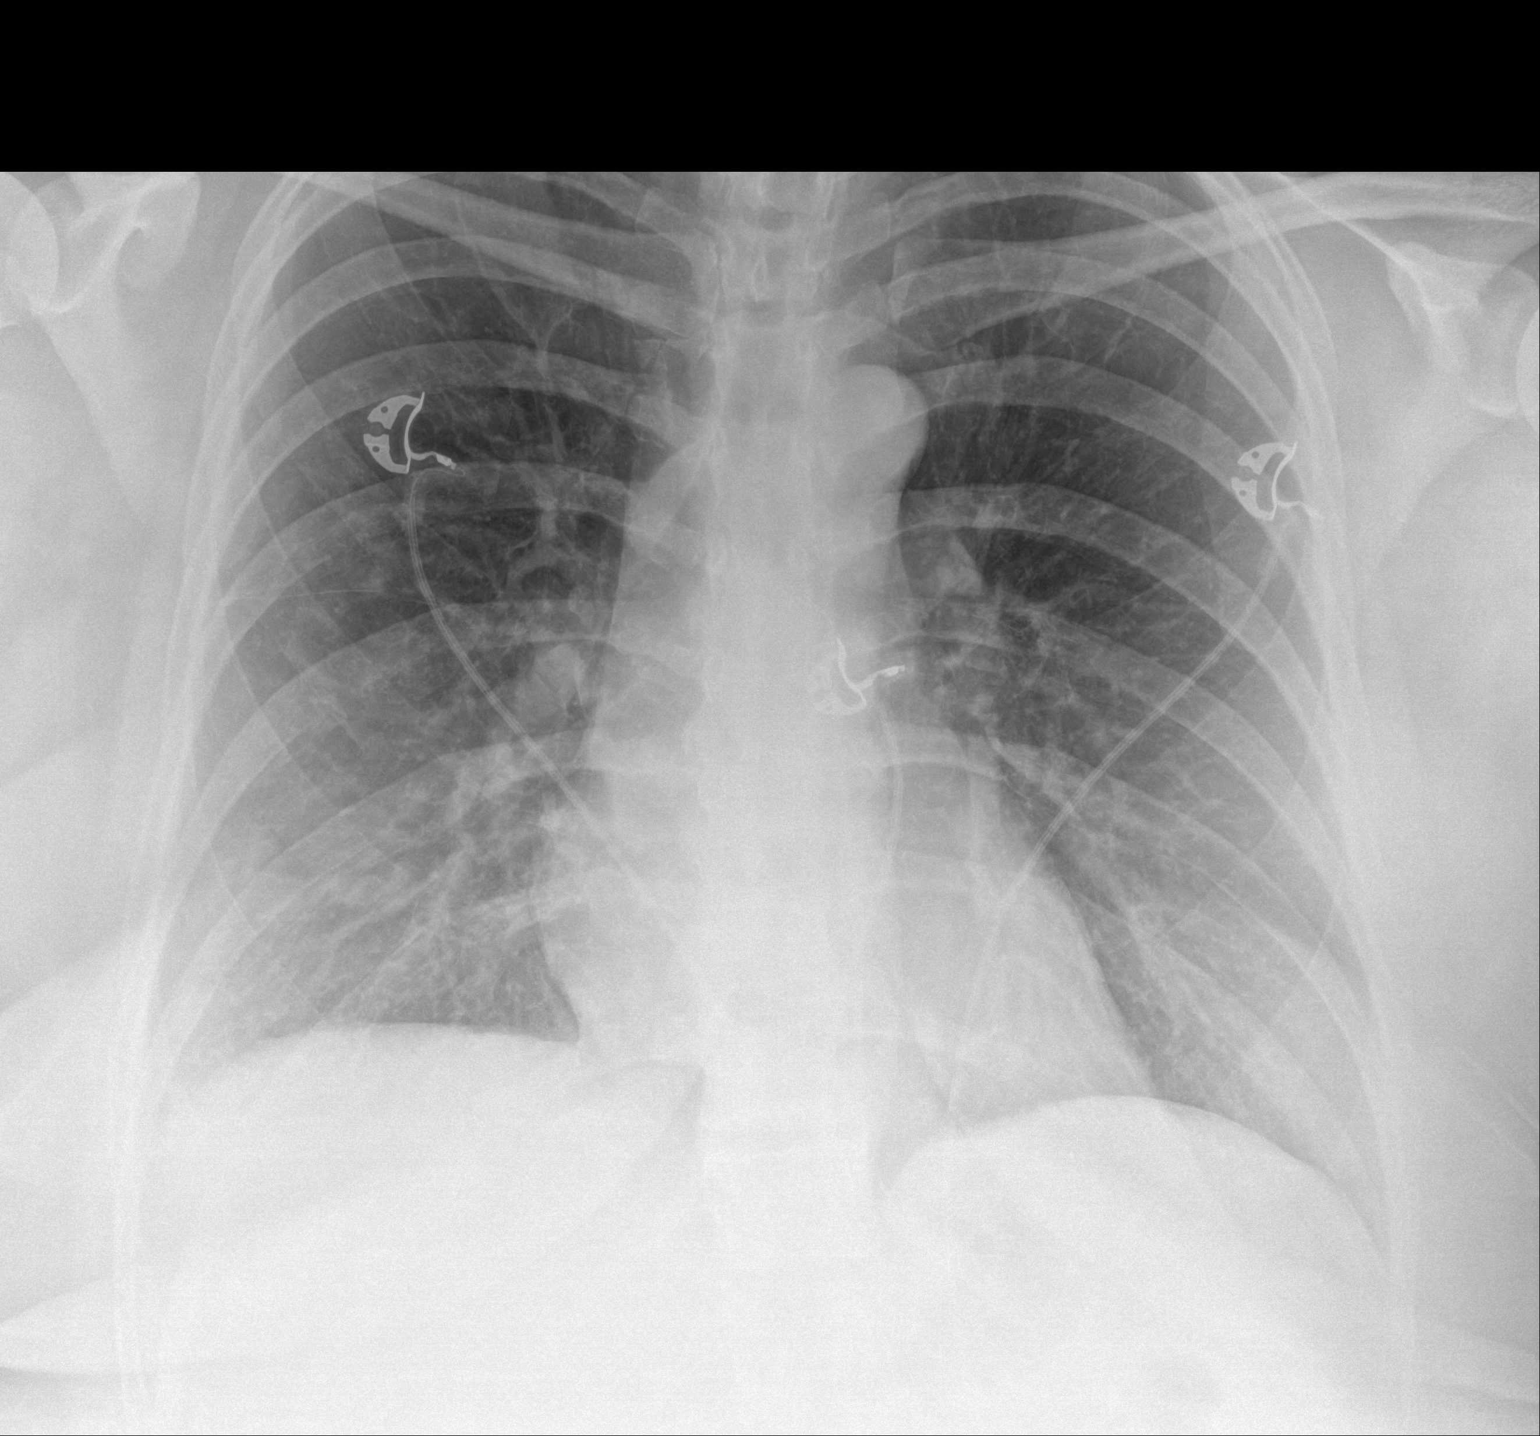

[1 of 1 positions shown; findings below may reference images not displayed]

FINDINGS: Small right pleural effusion. Lungs appear clear. The lung opacities
noted on the CT of the chest from 12/19/2019 are not appreciated on
the portable chest radiograph.

No pneumothorax.

Heart, mediastinum and hila are unremarkable.
IMPRESSION: 1. Lungs appear clear radiographically. The ground-glass lung
opacities noted on the recent chest CT are likely not visible on the
portable radiograph as opposed to being resolved.
2. Small right pleural effusion, not well-defined on the AP portable
chest radiograph.

## 2021-02-18 ENCOUNTER — Telehealth: Payer: Self-pay | Admitting: Family Medicine

## 2021-02-18 ENCOUNTER — Other Ambulatory Visit: Payer: Self-pay

## 2021-02-18 NOTE — Telephone Encounter (Signed)
[facility] faxed refill request for the following medications:   ELIQUIS 5 MG TABS tablet   Please advise.

## 2021-02-18 NOTE — Telephone Encounter (Signed)
[name], medication not on list and patient is due for office visit. PEC please advise when patient calls back.

## 2021-04-23 ENCOUNTER — Other Ambulatory Visit: Payer: Self-pay | Admitting: Family Medicine

## 2021-04-23 DIAGNOSIS — F419 Anxiety disorder, unspecified: Secondary | ICD-10-CM

## 2021-04-23 NOTE — Telephone Encounter (Signed)
Requested medication (s) are due for refill today: yes ? ?Requested medication (s) are on the active medication list: yes   ? ?Last refill: 02/02/21 #90  0 refills ? ?Future visit scheduled no ? ?Notes to clinic:Attempted to reach pt, left VM to call back to secure appt. ? ?Requested Prescriptions  ?Pending Prescriptions Disp Refills  ? escitalopram (LEXAPRO) 20 MG tablet [Pharmacy Med Name: ESCITALOPRAM 20MG  TABLETS] 90 tablet 0  ?  Sig: TAKE 1 TABLET(20 MG) BY MOUTH DAILY  ?  ? Psychiatry:  Antidepressants - SSRI Failed - 04/23/2021  2:51 PM  ?  ?  Failed - Completed PHQ-2 or PHQ-9 in the last 360 days  ?  ?  Failed - Valid encounter within last 6 months  ?  Recent Outpatient Visits   ? ?      ? 1 year ago Antiphospholipid antibody syndrome (HCC)  ? [facility], [place]  ? 1 year ago Severe obstructive sleep apnea  ? [facility] [place], [place], [place]  ? 1 year ago Annual physical exam  ? [facility], [place] Sailors  ? 1 year ago Community acquired pneumonia, bilateral  ? [facility] [place], [place], [place]  ? 1 year ago Anemia, unspecified type  ? [facility] [place], [place]  ? ?  ?  ? ?  ?  ?  ? ? ? ? ?

## 2021-04-24 NOTE — Telephone Encounter (Signed)
Needs to re-establish with one of the new APPs. Then we can give her a courtesy refill to get to that visit

## 2021-05-01 NOTE — Telephone Encounter (Signed)
[name] to schedule a follow up appt.  ?

## 2021-05-06 NOTE — Telephone Encounter (Signed)
[name] to schedule an appointment. PEC when patient calls back please schedule with app to reestablished as requested by Dr. B ?
